# Patient Record
Sex: Female | Born: 1940
Health system: Southern US, Community
[De-identification: ages and names within clinical notes are randomized; demographics above are authoritative.]

## PROBLEM LIST (undated history)

## (undated) DIAGNOSIS — F32A Depression, unspecified: Secondary | ICD-10-CM

## (undated) DIAGNOSIS — R0600 Dyspnea, unspecified: Secondary | ICD-10-CM

## (undated) DIAGNOSIS — H269 Unspecified cataract: Secondary | ICD-10-CM

## (undated) DIAGNOSIS — G43909 Migraine, unspecified, not intractable, without status migrainosus: Secondary | ICD-10-CM

## (undated) DIAGNOSIS — B019 Varicella without complication: Secondary | ICD-10-CM

## (undated) DIAGNOSIS — Z86010 Personal history of colonic polyps: Secondary | ICD-10-CM

## (undated) DIAGNOSIS — J309 Allergic rhinitis, unspecified: Secondary | ICD-10-CM

## (undated) DIAGNOSIS — H409 Unspecified glaucoma: Secondary | ICD-10-CM

## (undated) DIAGNOSIS — K8020 Calculus of gallbladder without cholecystitis without obstruction: Secondary | ICD-10-CM

## (undated) DIAGNOSIS — T7840XA Allergy, unspecified, initial encounter: Secondary | ICD-10-CM

## (undated) DIAGNOSIS — J189 Pneumonia, unspecified organism: Secondary | ICD-10-CM

## (undated) DIAGNOSIS — R32 Unspecified urinary incontinence: Secondary | ICD-10-CM

## (undated) DIAGNOSIS — N200 Calculus of kidney: Secondary | ICD-10-CM

## (undated) DIAGNOSIS — I10 Essential (primary) hypertension: Secondary | ICD-10-CM

## (undated) DIAGNOSIS — E7800 Pure hypercholesterolemia, unspecified: Secondary | ICD-10-CM

## (undated) HISTORY — DX: Unspecified glaucoma: H40.9

## (undated) HISTORY — DX: Unspecified urinary incontinence: R32

## (undated) HISTORY — DX: Calculus of kidney: N20.0

## (undated) HISTORY — DX: Unspecified cataract: H26.9

## (undated) HISTORY — DX: Migraine, unspecified, not intractable, without status migrainosus: G43.909

## (undated) HISTORY — DX: Calculus of gallbladder without cholecystitis without obstruction: K80.20

## (undated) HISTORY — PX: TUBAL LIGATION: SHX77

## (undated) HISTORY — DX: Allergy, unspecified, initial encounter: T78.40XA

## (undated) HISTORY — DX: Essential (primary) hypertension: I10

## (undated) HISTORY — PX: EYE SURGERY: SHX253

## (undated) HISTORY — DX: Pure hypercholesterolemia, unspecified: E78.00

## (undated) HISTORY — DX: Personal history of colonic polyps: Z86.010

## (undated) HISTORY — DX: Allergic rhinitis, unspecified: J30.9

## (undated) HISTORY — DX: Varicella without complication: B01.9

---
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
## 1993-08-26 HISTORY — PX: CHOLECYSTECTOMY: SHX55

## 2014-03-07 ENCOUNTER — Ambulatory Visit (INDEPENDENT_AMBULATORY_CARE_PROVIDER_SITE_OTHER): Payer: Medicare Other

## 2014-03-07 ENCOUNTER — Other Ambulatory Visit: Payer: Self-pay | Admitting: *Deleted

## 2014-03-07 ENCOUNTER — Ambulatory Visit (INDEPENDENT_AMBULATORY_CARE_PROVIDER_SITE_OTHER): Payer: Medicare Other | Admitting: Podiatry

## 2014-03-07 ENCOUNTER — Encounter: Payer: Self-pay | Admitting: Podiatry

## 2014-03-07 VITALS — BP 129/75 | HR 77 | Resp 16 | Ht 64.5 in | Wt 150.0 lb

## 2014-03-07 DIAGNOSIS — M2040 Other hammer toe(s) (acquired), unspecified foot: Secondary | ICD-10-CM

## 2014-03-07 DIAGNOSIS — M21619 Bunion of unspecified foot: Secondary | ICD-10-CM

## 2014-03-07 DIAGNOSIS — M21622 Bunionette of left foot: Secondary | ICD-10-CM

## 2014-03-07 DIAGNOSIS — M778 Other enthesopathies, not elsewhere classified: Secondary | ICD-10-CM

## 2014-03-07 DIAGNOSIS — M2041 Other hammer toe(s) (acquired), right foot: Secondary | ICD-10-CM

## 2014-03-07 DIAGNOSIS — M7750 Other enthesopathy of unspecified foot: Secondary | ICD-10-CM

## 2014-03-07 DIAGNOSIS — M79609 Pain in unspecified limb: Secondary | ICD-10-CM

## 2014-03-07 DIAGNOSIS — M779 Enthesopathy, unspecified: Secondary | ICD-10-CM

## 2014-03-07 DIAGNOSIS — Q828 Other specified congenital malformations of skin: Secondary | ICD-10-CM

## 2014-03-07 NOTE — Progress Notes (Signed)
Spot on the bottom of the left foot 5th plantar met head , the right 5th toe has a corn.  Objective: Vital signs are stable she is alert and oriented x3. She has pain on palpation fifth metatarsophalangeal joint of the left foot with a solitary porokeratotic lesion sub-fifth metatarsal head left. She also has adductovarus rotated hammertoe deformity fifth right with an overlying area of reactive hyperkeratosis.  Assessment: Pain in limb secondary capsulitis and porokeratotic lesion plantar aspect fifth metatarsal head of the left foot. Painful hammertoe deformity fourth fifth digit right foot.  Plan: Discussed etiology pathology conservative versus surgical therapies. Injected the fifth metatarsophalangeal joint today with Kenalog and local anesthetic. Performed excision of the porokeratosis and applied salicylic acid under occlusion to remove the remaining hyperkeratotic lesion. I debrided the corn overlying the fifth digit right foot.

## 2015-10-05 ENCOUNTER — Encounter: Payer: Self-pay | Admitting: Family Medicine

## 2015-10-05 ENCOUNTER — Ambulatory Visit (INDEPENDENT_AMBULATORY_CARE_PROVIDER_SITE_OTHER): Payer: PPO | Admitting: Family Medicine

## 2015-10-05 VITALS — BP 130/72 | HR 92 | Temp 98.4°F | Ht 64.5 in | Wt 159.2 lb

## 2015-10-05 DIAGNOSIS — H6120 Impacted cerumen, unspecified ear: Secondary | ICD-10-CM | POA: Insufficient documentation

## 2015-10-05 DIAGNOSIS — Z23 Encounter for immunization: Secondary | ICD-10-CM | POA: Diagnosis not present

## 2015-10-05 DIAGNOSIS — N8110 Cystocele, unspecified: Secondary | ICD-10-CM

## 2015-10-05 DIAGNOSIS — H6121 Impacted cerumen, right ear: Secondary | ICD-10-CM

## 2015-10-05 DIAGNOSIS — R32 Unspecified urinary incontinence: Secondary | ICD-10-CM

## 2015-10-05 HISTORY — DX: Unspecified urinary incontinence: R32

## 2015-10-05 NOTE — Progress Notes (Signed)
Pre visit review using our clinic review tool, if applicable. No additional management support is needed unless otherwise documented below in the visit note. 

## 2015-10-05 NOTE — Assessment & Plan Note (Signed)
Patient with mild incontinence of urine. Pelvic exam did reveal some degree of bladder prolapse and possible uterine prolapse that could be contributing to this. She has no UTI-like symptoms. We will refer to gynecology for evaluation of prolapse for consideration of pessary if deemed necessary. Given return precautions.

## 2015-10-05 NOTE — Patient Instructions (Signed)
History meet you. We will refer you to gynecology for evaluation of your bladder and uterine prolapse and incontinence. Please monitor your hearing. If you continue to have issues with let us know. If you develop abdominal pain, burning with urination, frequency, urgency, or any new or change in symptoms please seek medical attention.

## 2015-10-05 NOTE — Progress Notes (Signed)
Patient ID: Lauren [name], female   DOB: 02-20-1941, 75 y.o.   MRN: [id number]  Tommi Rumps, MD Phone: [phone]  Lauren [name] is a 75 y.o. female who presents today for new patient visit.  Urine incontinence: Patient notes for the last 6-8 months she leaks a small amount of urine at random times. Does not happen every day. Not associated with coughing or sneezing. Note she does wear a pad. No frequency, urgency, or dysuria. No hematuria. No abdominal pain. Denies back pain with this, no saddle anesthesia or bowel incontinence, no fevers, no history of cancer.  Trouble hearing out of her right ear has been going on for last month. Note she went to a seminar and sat under a fan and notes she had a cold following this. Notes the cold symptoms went away though the sensation in her right ear has remained. States it feels like the ears underwater. No pain or tinnitus. Mild decreased hearing.  Active Ambulatory Problems    Diagnosis Date Noted  . Cerumen impaction 10/05/2015  . Incontinence of urine in female 10/05/2015   Resolved Ambulatory Problems    Diagnosis Date Noted  . No Resolved Ambulatory Problems   Past Medical History  Diagnosis Date  . Chickenpox   . Allergic rhinitis   . Migraines   . Urinary incontinence     Family History  Problem Relation Age of Onset  . Arthritis    . Heart disease    . Hypertension      Social History   Social History  . Marital Status: Unknown    Spouse Name: N/A  . Number of Children: N/A  . Years of Education: N/A   Occupational History  . Not on file.   Social History Main Topics  . Smoking status: Former Research scientist (life sciences)  . Smokeless tobacco: Never Used  . Alcohol Use: No  . Drug Use: No  . Sexual Activity: Not on file   Other Topics Concern  . Not on file   Social History Narrative    ROS   General:  Negative for nexplained weight loss, fever Skin: Negative for new or changing mole, sore that won't heal HEENT: Positive  for trouble hearing, Negative for trouble seeing, ringing in ears, mouth sores, hoarseness, change in voice, dysphagia. CV:  Negative for chest pain, dyspnea, edema, palpitations Resp: Negative for cough, dyspnea, hemoptysis GI: Negative for nausea, vomiting, diarrhea, constipation, abdominal pain, melena, hematochezia. GU: Positive for incontinence, Negative for dysuria, urinary hesitance, hematuria, vaginal or penile discharge, polyuria, sexual difficulty, lumps in testicle or breasts MSK: Negative for muscle cramps or aches, joint pain or swelling Neuro: Negative for headaches, weakness, numbness, dizziness, passing out/fainting Psych: Negative for depression, anxiety, memory problems  Objective  Physical Exam Filed Vitals:   10/05/15 1418  BP: 130/72  Pulse: 92  Temp: 98.4 F (36.9 C)    BP Readings from Last 3 Encounters:  10/05/15 130/72  03/07/14 129/75   Wt Readings from Last 3 Encounters:  10/05/15 159 lb 3.2 oz (72.213 kg)  03/07/14 150 lb (68.04 kg)    Physical Exam  Constitutional: She is well-developed, well-nourished, and in no distress.  HENT:  Head: Normocephalic and atraumatic.  Right Ear: External ear normal.  Left Ear: External ear normal.  Mouth/Throat: Oropharynx is clear and moist.  Left TM normal, right TM initially obscured by cerumen, though after irrigation appears normal with mild amount of cerumen remaining, patient's hearing much improved after irrigation of right ear  canal  Eyes: Conjunctivae are normal. Pupils are equal, round, and reactive to light.  Neck: Neck supple.  Cardiovascular: Normal rate, regular rhythm and normal heart sounds.  Exam reveals no gallop and no friction rub.   No murmur heard. Pulmonary/Chest: Effort normal and breath sounds normal. No respiratory distress. She has no wheezes. She has no rales.  Abdominal: Soft. Bowel sounds are normal. She exhibits no distension. There is no tenderness. There is no rebound and no  guarding.  Genitourinary:  Normal labia, normal vaginal mucosa, cervix appears normal though is relatively close to the vaginal opening, on bimanual exam cervix felt to move towards vaginal opening with coughing, anterior vaginal wall felt to move down on coughing  Musculoskeletal: She exhibits no edema.  Lymphadenopathy:    She has no cervical adenopathy.  Neurological: She is alert. Gait normal.  Skin: Skin is warm and dry. She is not diaphoretic.  Psychiatric: Mood and affect normal.     Assessment/Plan:   Cerumen impaction This is likely the cause of the right ear hearing trouble. Much improved after irrigation by CMA. Patient will continue to monitor.  Incontinence of urine in female Patient with mild incontinence of urine. Pelvic exam did reveal some degree of bladder prolapse and possible uterine prolapse that could be contributing to this. She has no UTI-like symptoms. We will refer to gynecology for evaluation of prolapse for consideration of pessary if deemed necessary. Given return precautions.    Orders Placed This Encounter  Procedures  . Pneumococcal polysaccharide vaccine 23-valent greater than or equal to 2yo subcutaneous/IM  . Ambulatory referral to Gynecology    Referral Priority:  Routine    Referral Type:  Consultation    Referral Reason:  Specialty Services Required    Requested Specialty:  Gynecology    Number of Visits Requested:  1    Tommi Rumps

## 2015-10-05 NOTE — Assessment & Plan Note (Signed)
This is likely the cause of the right ear hearing trouble. Much improved after irrigation by CMA. Patient will continue to monitor.

## 2015-10-11 ENCOUNTER — Other Ambulatory Visit: Payer: Self-pay | Admitting: Family Medicine

## 2015-10-11 DIAGNOSIS — N8110 Cystocele, unspecified: Secondary | ICD-10-CM

## 2015-10-18 DIAGNOSIS — Z87891 Personal history of nicotine dependence: Secondary | ICD-10-CM | POA: Diagnosis not present

## 2015-10-18 DIAGNOSIS — Z6826 Body mass index (BMI) 26.0-26.9, adult: Secondary | ICD-10-CM | POA: Diagnosis not present

## 2015-10-18 DIAGNOSIS — Z0000 Encounter for general adult medical examination without abnormal findings: Secondary | ICD-10-CM | POA: Diagnosis not present

## 2015-10-18 DIAGNOSIS — R32 Unspecified urinary incontinence: Secondary | ICD-10-CM | POA: Diagnosis not present

## 2015-10-18 DIAGNOSIS — E663 Overweight: Secondary | ICD-10-CM | POA: Diagnosis not present

## 2015-11-03 ENCOUNTER — Ambulatory Visit (INDEPENDENT_AMBULATORY_CARE_PROVIDER_SITE_OTHER): Payer: PPO | Admitting: Urology

## 2015-11-03 ENCOUNTER — Encounter: Payer: Self-pay | Admitting: Urology

## 2015-11-03 VITALS — BP 151/73 | HR 76 | Ht 63.5 in | Wt 158.6 lb

## 2015-11-03 DIAGNOSIS — N361 Urethral diverticulum: Secondary | ICD-10-CM | POA: Diagnosis not present

## 2015-11-03 DIAGNOSIS — R32 Unspecified urinary incontinence: Secondary | ICD-10-CM

## 2015-11-03 LAB — URINALYSIS, COMPLETE
BILIRUBIN UA: NEGATIVE
GLUCOSE, UA: NEGATIVE
Ketones, UA: NEGATIVE
Leukocytes, UA: NEGATIVE
NITRITE UA: NEGATIVE
PH UA: 6 (ref 5.0–7.5)
PROTEIN UA: NEGATIVE
RBC UA: NEGATIVE
SPEC GRAV UA: 1.015 (ref 1.005–1.030)
Urobilinogen, Ur: 0.2 mg/dL (ref 0.2–1.0)

## 2015-11-03 LAB — MICROSCOPIC EXAMINATION
Bacteria, UA: NONE SEEN
WBC, UA: NONE SEEN /hpf (ref 0–?)

## 2015-11-03 LAB — BLADDER SCAN AMB NON-IMAGING

## 2015-11-03 NOTE — Progress Notes (Signed)
Bladder Scan °Patient void: 36 ml °Performed By: K Eldredge Veldhuizen, CMA °

## 2015-11-03 NOTE — Progress Notes (Signed)
11/03/2015 10:20 AM   Shauna Hugh Quentin Mulling February 12, 1941 [id number]  Referring provider: Guadalupe Maple, MD [address] [place], [place] [address]  Chief Complaint  Patient presents with  . Bladder Prolapse    urinary incontinence    HPI: The patient is a 75 year old woman who has worsening incontinence over 1 year. She says she does not leak with stress maneuvers or with urgency. She leaks a small amount not associated with awareness during activity levels. She has a little bit of dampness some nights. She wears 3 pads per day moderately wet.  She denies a history of previous GU surgery urinary tract infections and has no neurologic risk factor symptoms. She had a kidney stone years ago. Her bowel movements are normal. The presentation is not been medically managed  Modifying factors: There are no other modifying factors  Associated signs and symptoms: There are no other associated signs and symptoms Aggravating and relieving factors: There are no other aggravating or relieving factors Severity: Mild Duration: Persistent     PMH: Past Medical History  Diagnosis Date  . Chickenpox   . Allergic rhinitis   . Migraines   . Urinary incontinence     Surgical History: Past Surgical History  Procedure Laterality Date  . Cholecystectomy  1995  . Tonsillectomy  1962    Home Medications:    Medication List    Notice  As of 11/03/2015 10:20 AM   You have not been prescribed any medications.      Allergies:  Allergies  Allergen Reactions  . Eggs Or Egg-Derived Products   . Hydrocodone-Guaifenesin Other (See Comments)    Cant sleep   . Mobic [Meloxicam] Swelling  . Sulfa Antibiotics Other (See Comments)    Cold sweats, passing out   . Codeine Rash    Cold sweats     Family History: Family History  Problem Relation Age of Onset  . Arthritis    . Heart disease    . Hypertension    . Hematuria Father     Social History:  reports that she has quit smoking. She has  never used smokeless tobacco. She reports that she does not drink alcohol or use illicit drugs.  ROS: UROLOGY Frequent Urination?: No Hard to postpone urination?: No Burning/pain with urination?: No Get up at night to urinate?: Yes Leakage of urine?: Yes Urine stream starts and stops?: No Trouble starting stream?: No Do you have to strain to urinate?: No Blood in urine?: No Urinary tract infection?: No Sexually transmitted disease?: No Injury to kidneys or bladder?: No Painful intercourse?: No Weak stream?: No Currently pregnant?: No Vaginal bleeding?: No Last menstrual period?: 1968  Gastrointestinal Nausea?: No Vomiting?: No Indigestion/heartburn?: No Diarrhea?: No Constipation?: No  Constitutional Fever: No Night sweats?: No Weight loss?: No Fatigue?: No  Skin Skin rash/lesions?: No Itching?: No  Eyes Blurred vision?: No Double vision?: No  Ears/Nose/Throat Sore throat?: No Sinus problems?: Yes  Hematologic/Lymphatic Swollen glands?: No Easy bruising?: Yes  Cardiovascular Leg swelling?: No Chest pain?: No  Respiratory Cough?: No Shortness of breath?: No  Endocrine Excessive thirst?: No  Musculoskeletal Back pain?: No Joint pain?: No  Neurological Headaches?: No Dizziness?: No  Psychologic Depression?: No Anxiety?: No  Physical Exam: BP 151/73 mmHg  Pulse 76  Ht 5' 3.5" (1.613 m)  Wt 158 lb 9.6 oz (71.94 kg)  BMI 27.65 kg/m2  Constitutional:  Alert and oriented, No acute distress. HEENT: [place] AT, moist mucus membranes.  Trachea midline, no  masses. Cardiovascular: No clubbing, cyanosis, or edema. Respiratory: Normal respiratory effort, no increased work of breathing. GI: Abdomen is soft, nontender, nondistended, no abdominal masses GU: No CVA tenderness. At rest the patient had small grade 2 cystocele with mild central defect that did not distend further with a cough. She also has some descensus of her urethra but did not distend  further with a cough. She had a double suburethral swelling almost purple discoloration. She had no rectocele. Skin: No rashes, bruises or suspicious lesions. Lymph: No cervical or inguinal adenopathy. Neurologic: Grossly intact, no focal deficits, moving all 4 extremities. Psychiatric: Normal mood and affect.  Laboratory Data:  Urinalysis No results found for: COLORURINE, APPEARANCEUR, LABSPEC, PHURINE, GLUCOSEU, HGBUR, BILIRUBINUR, KETONESUR, PROTEINUR, UROBILINOGEN, NITRITE, LEUKOCYTESUR  Pertinent Imaging: None  Assessment & Plan:  The patient primarily has leakage not associated awareness and mild enuresis. My index of suspicion is mild that she has urethral diverticulum. Because she leaks not associated with awareness that should be ruled out. Because of her enuresis is reasonable to try overactive bladder dictations first. Having said that urodynamics would be quite useful to order.   Return with MRI with and without contrast. If no diverticulum try behavioral therapy and medication. If fail order urodynamics  1. Incontinence of urine in female 2. Possible urethral diverticulum - Urinalysis, Complete - Bladder Scan (Post Void Residual) in office    Reece Packer, MD  [facility] [address] [place], [place] [address] [phone]

## 2015-11-08 ENCOUNTER — Ambulatory Visit (INDEPENDENT_AMBULATORY_CARE_PROVIDER_SITE_OTHER): Payer: PPO

## 2015-11-08 VITALS — BP 132/72 | HR 83 | Temp 97.4°F | Resp 14 | Ht 63.5 in | Wt 156.1 lb

## 2015-11-08 DIAGNOSIS — Z1239 Encounter for other screening for malignant neoplasm of breast: Secondary | ICD-10-CM

## 2015-11-08 DIAGNOSIS — E2839 Other primary ovarian failure: Secondary | ICD-10-CM | POA: Diagnosis not present

## 2015-11-08 DIAGNOSIS — Z0000 Encounter for general adult medical examination without abnormal findings: Secondary | ICD-10-CM

## 2015-11-08 NOTE — Patient Instructions (Addendum)
Lauren [name] , Thank you for taking time to come for your Medicare Wellness Visit. I appreciate your ongoing commitment to your health goals. Please review the following plan we discussed and let me know if I can assist you in the future.   Return in 2 weeks for follow up with Dr. Caryl Bis.   This is a list of the screening recommended for you and due dates:  Health Maintenance  Topic Date Due  . Mammogram  12/07/1992  . Colon Cancer Screening  12/07/1992  . Shingles Vaccine  12/08/2002  . DEXA scan (bone density measurement)  12/08/2007  . Flu Shot  11/24/2015*  . Pneumonia vaccines (2 of 2 - PCV13) 10/04/2016  . Tetanus Vaccine  08/31/2019  *Topic was postponed. The date shown is not the original due date.    Bone Densitometry Bone densitometry is an imaging test that uses a special X-ray to measure the amount of calcium and other minerals in your bones (bone density). This test is also known as a bone mineral density test or dual-energy X-ray absorptiometry (DXA). The test can measure bone density at your hip and your spine. It is similar to having a regular X-ray. You may have this test to:  Diagnose a condition that causes weak or thin bones (osteoporosis).  Predict your risk of a broken bone (fracture).  Determine how well osteoporosis treatment is working. LET [facility] CARE PROVIDER KNOW ABOUT:  Any allergies you have.  All medicines you are taking, including vitamins, herbs, eye drops, creams, and over-the-counter medicines.  Previous problems you or members of your family have had with the use of anesthetics.  Any blood disorders you have.  Previous surgeries you have had.  Medical conditions you have.  Possibility of pregnancy.  Any other medical test you had within the previous 14 days that used contrast material. RISKS AND COMPLICATIONS Generally, this is a safe procedure. However, problems can occur and may include the following:  This test exposes you  to a very small amount of radiation.  The risks of radiation exposure may be greater to unborn children. BEFORE THE PROCEDURE  Do not take any calcium supplements for 24 hours before having the test. You can otherwise eat and drink what you usually do.  Take off all metal jewelry, eyeglasses, dental appliances, and any other metal objects. PROCEDURE  You may lie on an exam table. There will be an X-ray generator below you and an imaging device above you.  Other devices, such as boxes or braces, may be used to position your body properly for the scan.  You will need to lie still while the machine slowly scans your body.  The images will show up on a computer monitor. AFTER THE PROCEDURE You may need more testing at a later time.   This information is not intended to replace advice given to you by your health care provider. Make sure you discuss any questions you have with your health care provider.   Document Released: 09/03/2004 Document Revised: 09/02/2014 Document Reviewed: 01/20/2014 Elsevier Interactive Patient Education 2016 [employer].  Colonoscopy A colonoscopy is an exam to look at the entire large intestine (colon). This exam can help find problems such as tumors, polyps, inflammation, and areas of bleeding. The exam takes about 1 hour.  LET [facility] CARE PROVIDER KNOW ABOUT:   Any allergies you have.  All medicines you are taking, including vitamins, herbs, eye drops, creams, and over-the-counter medicines.  Previous problems you or  members of your family have had with the use of anesthetics.  Any blood disorders you have.  Previous surgeries you have had.  Medical conditions you have. RISKS AND COMPLICATIONS  Generally, this is a safe procedure. However, as with any procedure, complications can occur. Possible complications include:  Bleeding.  Tearing or rupture of the colon wall.  Reaction to medicines given during the exam.  Infection  (rare). BEFORE THE PROCEDURE   Ask your health care provider about changing or stopping your regular medicines.  You may be prescribed an oral bowel prep. This involves drinking a large amount of medicated liquid, starting the day before your procedure. The liquid will cause you to have multiple loose stools until your stool is almost clear or light green. This cleans out your colon in preparation for the procedure.  Do not eat or drink anything else once you have started the bowel prep, unless your health care provider tells you it is safe to do so.  Arrange for someone to drive you home after the procedure. PROCEDURE   You will be given medicine to help you relax (sedative).  You will lie on your side with your knees bent.  A long, flexible tube with a light and camera on the end (colonoscope) will be inserted through the rectum and into the colon. The camera sends video back to a computer screen as it moves through the colon. The colonoscope also releases carbon dioxide gas to inflate the colon. This helps your health care provider see the area better.  During the exam, your health care provider may take a small tissue sample (biopsy) to be examined under a microscope if any abnormalities are found.  The exam is finished when the entire colon has been viewed. AFTER THE PROCEDURE   Do not drive for 24 hours after the exam.  You may have a small amount of blood in your stool.  You may pass moderate amounts of gas and have mild abdominal cramping or bloating. This is caused by the gas used to inflate your colon during the exam.  Ask when your test results will be ready and how you will get your results. Make sure you get your test results.   This information is not intended to replace advice given to you by your health care provider. Make sure you discuss any questions you have with your health care provider.   Document Released: 08/09/2000 Document Revised: 06/02/2013 Document  Reviewed: 04/19/2013 Elsevier Interactive Patient Education 2016 [place] A mammogram is an X-ray of the breasts that is done to check for abnormal changes. This procedure can screen for and detect any changes that may suggest breast cancer. A mammogram can also identify other changes and variations in the breast, such as:  Inflammation of the breast tissue (mastitis).  An infected area that contains a collection of pus (abscess).  A fluid-filled sac (cyst).  Fibrocystic changes. This is when breast tissue becomes denser, which can make the tissue feel rope-like or uneven under the skin.  Tumors that are not cancerous (benign). LET [facility] CARE PROVIDER KNOW ABOUT:  Any allergies you have.  If you have breast implants.  If you have had previous breast disease, biopsy, or surgery.  If you are breastfeeding.  Any possibility that you could be pregnant, if this applies.  If you are younger than age 12.  If you have a family history of breast cancer. RISKS AND COMPLICATIONS Generally, this is a safe procedure.  However, problems may occur, including:  Exposure to radiation. Radiation levels are very low with this test.  The results being misinterpreted.  The need for further tests.  The inability of the mammogram to detect certain cancers. BEFORE THE PROCEDURE  Schedule your test about 1-2 weeks after your menstrual period. This is usually when your breasts are the least tender.  If you have had a mammogram done at a different facility in the past, get the mammogram X-rays or have them sent to your current exam facility in order to compare them.  Wash your breasts and under your arms the day of the test.  Do not wear deodorants, perfumes, lotions, or powders anywhere on your body on the day of the test.  Remove any jewelry from your neck.  Wear clothes that you can change into and out of easily. PROCEDURE  You will undress from the waist up and  put on a gown.  You will stand in front of the X-ray machine.  Each breast will be placed between two plastic or glass plates. The plates will compress your breast for a few seconds. Try to stay as relaxed as possible during the procedure. This does not cause any harm to your breasts and any discomfort you feel will be very brief.  X-rays will be taken from different angles of each breast. The procedure may vary among health care providers and hospitals. AFTER THE PROCEDURE  The mammogram will be examined by a specialist (radiologist).  You may need to repeat certain parts of the test, depending on the quality of the images. This is commonly done if the radiologist needs a better view of the breast tissue.  Ask when your test results will be ready. Make sure you get your test results.  You may resume your normal activities.   This information is not intended to replace advice given to you by your health care provider. Make sure you discuss any questions you have with your health care provider.   Document Released: 08/09/2000 Document Revised: 05/03/2015 Document Reviewed: 10/21/2014 Elsevier Interactive Patient Education [employer].

## 2015-11-08 NOTE — Progress Notes (Signed)
Subjective:   Lauren [name] is a 75 y.o. female who presents for an Initial Medicare Annual Wellness Visit.  Review of Systems    No ROS.  Medicare Wellness Visit.  Cardiac Risk Factors include: advanced age (>48men, >44 women)     Objective:    Today's Vitals   11/08/15 1057  BP: 132/72  Pulse: 83  Temp: 97.4 F (36.3 C)  TempSrc: Oral  Resp: 14  Height: 5' 3.5" (1.613 m)  Weight: 156 lb 1.9 oz (70.816 kg)  SpO2: 98%    Current Medications (verified) No outpatient encounter prescriptions on file as of 11/08/2015.   No facility-administered encounter medications on file as of 11/08/2015.    Allergies (verified) Eggs or egg-derived products; Hydrocodone-guaifenesin; Mobic; Sulfa antibiotics; and Codeine   History: Past Medical History  Diagnosis Date  . Chickenpox   . Allergic rhinitis   . Migraines   . Urinary incontinence    Past Surgical History  Procedure Laterality Date  . Cholecystectomy  1995  . Tonsillectomy  1962   Family History  Problem Relation Age of Onset  . Arthritis    . Heart disease    . Hypertension    . Hematuria Father    Social History   Occupational History  . Not on file.   Social History Main Topics  . Smoking status: Former Research scientist (life sciences)  . Smokeless tobacco: Never Used  . Alcohol Use: No  . Drug Use: No  . Sexual Activity: No    Tobacco Counseling Counseling given: Not Answered   Activities of Daily Living In your present state of health, do you have any difficulty performing the following activities: 11/08/2015  Hearing? N  Vision? N  Difficulty concentrating or making decisions? N  Walking or climbing stairs? N  Dressing or bathing? N  Doing errands, shopping? N  Preparing Food and eating ? N  Using the Toilet? N  In the past six months, have you accidently leaked urine? Y  Do you have problems with loss of bowel control? N  Managing your Medications? N  Managing your Finances? N  Housekeeping or managing your  Housekeeping? N    Immunizations and Health Maintenance Immunization History  Administered Date(s) Administered  . Pneumococcal Polysaccharide-23 10/05/2015   Health Maintenance Due  Topic Date Due  . MAMMOGRAM  12/07/1992  . COLONOSCOPY  12/07/1992  . ZOSTAVAX  12/08/2002  . DEXA SCAN  12/08/2007    Patient Care Team: Leone Haven, MD as PCP - General (Family Medicine)  Indicate any recent Medical Services you may have received from other than [facility] providers in the past year (date may be approximate).     Assessment:   This is a routine wellness examination for Lauren [name]. The goal of the wellness visit is to assist the patient how to close the gaps in care and create a preventative care plan for the patient.   Osteoporosis risk reviewed.  Medications reviewed; none on file.   Safety issues reviewed; smoke detectors in the home. No firearms in the home.  Wears seatbelts when driving or riding with others. No violence in the home.  No identified risk were noted; The patient was oriented x 3; appropriate in dress and manner and no objective failures at ADL's or IADL's.   Influenza vaccine postponed for follow up PCP.  Severe allergy to eggs.  ZOSTAVAX vaccine declined.  Hx of shingles x3.  Mammogram, Bone Density and COLOGUARD order completed.  Educational material provided.  Patient Concerns:  None at this time.  Follow up with PCP as needed.  Hearing/Vision screen Hearing Screening Comments: Passes the whisper test Vision Screening Comments: Followed by Dr. Gloriann Loan Wears glasses Last OV 2013  Upcoming visit scheduled   Dietary issues and exercise activities discussed: Current Exercise Habits: Home exercise routine, Type of exercise: walking, Time (Minutes): 30, Frequency (Times/Week): 4, Weekly Exercise (Minutes/Week): 120, Intensity: Moderate  Goals    . Healthy Lifestyle     Maintain exercise regiment of walking Stay hydrated!  Drink plenty of water. Low  carb foods.  Lean meats, fruits and vegetables.      Depression Screen PHQ 2/9 Scores 11/08/2015  PHQ - 2 Score 0    Fall Risk Fall Risk  11/08/2015  Falls in the past year? No    Cognitive Function: MMSE - Mini Mental State Exam 11/08/2015  Orientation to time 5  Orientation to Place 5  Registration 3  Attention/ Calculation 5  Recall 3  Language- name 2 objects 2  Language- repeat 1  Language- follow 3 step command 3  Language- read & follow direction 1  Write a sentence 1  Copy design 1  Total score 30    Screening Tests Health Maintenance  Topic Date Due  . MAMMOGRAM  12/07/1992  . COLONOSCOPY  12/07/1992  . ZOSTAVAX  12/08/2002  . DEXA SCAN  12/08/2007  . INFLUENZA VACCINE  11/24/2015 (Originally 03/27/2015)  . PNA vac Low Risk Adult (2 of 2 - PCV13) 10/04/2016  . TETANUS/TDAP  08/31/2019      Plan:   End of life planning; Advance aging; Advanced directives discussed. Copy of current HCPOA/Living Will requested.   During the course of the visit, [place] was educated and counseled about the following appropriate screening and preventive services:   Vaccines to include Pneumoccal, Influenza, Hepatitis B, Td, Zostavax, HCV  Electrocardiogram  Cardiovascular disease screening  Colorectal cancer screening  Bone density screening  Diabetes screening  Glaucoma screening  Mammography/PAP  Nutrition counseling  Smoking cessation counseling  Patient Instructions (the written plan) were given to the patient.    Varney Biles, LPN   [id number]

## 2015-11-08 NOTE — Progress Notes (Signed)
Patient ID: Lauren [name], female   DOB: [birth year], 75 y.o.   MRN: [id number] I have reviewed and agree with the above note.

## 2015-11-13 ENCOUNTER — Other Ambulatory Visit: Payer: Self-pay | Admitting: Surgical

## 2015-11-13 DIAGNOSIS — E2839 Other primary ovarian failure: Secondary | ICD-10-CM

## 2015-11-20 ENCOUNTER — Ambulatory Visit (INDEPENDENT_AMBULATORY_CARE_PROVIDER_SITE_OTHER): Payer: PPO | Admitting: Family Medicine

## 2015-11-20 ENCOUNTER — Ambulatory Visit: Payer: PPO

## 2015-11-20 ENCOUNTER — Encounter: Payer: Self-pay | Admitting: Family Medicine

## 2015-11-20 VITALS — BP 134/62 | HR 82 | Temp 98.1°F | Ht 63.5 in | Wt 158.0 lb

## 2015-11-20 DIAGNOSIS — H6121 Impacted cerumen, right ear: Secondary | ICD-10-CM | POA: Diagnosis not present

## 2015-11-20 DIAGNOSIS — E7800 Pure hypercholesterolemia, unspecified: Secondary | ICD-10-CM

## 2015-11-20 DIAGNOSIS — R32 Unspecified urinary incontinence: Secondary | ICD-10-CM | POA: Diagnosis not present

## 2015-11-20 NOTE — Progress Notes (Signed)
Pre visit review using our clinic review tool, if applicable. No additional management support is needed unless otherwise documented below in the visit note. 

## 2015-11-20 NOTE — Patient Instructions (Signed)
Nice to see you. Please continue to work on diet and exercise for your cholesterol. Please keep the appointment for your MRI. If you develop chest pain, shortness of breath, abdominal pain, or any new or changing symptoms please seek medical attention.

## 2015-11-24 ENCOUNTER — Ambulatory Visit
Admission: RE | Admit: 2015-11-24 | Discharge: 2015-11-24 | Disposition: A | Discharge status: Home / Self Care | Payer: PPO | Source: Ambulatory Visit | Attending: Urology | Admitting: Urology

## 2015-11-24 DIAGNOSIS — D259 Leiomyoma of uterus, unspecified: Secondary | ICD-10-CM | POA: Diagnosis not present

## 2015-11-24 DIAGNOSIS — N361 Urethral diverticulum: Secondary | ICD-10-CM | POA: Diagnosis not present

## 2015-11-24 LAB — POCT I-STAT CREATININE: CREATININE: 1 mg/dL (ref 0.44–1.00)

## 2015-11-24 MED ORDER — GADOBENATE DIMEGLUMINE 529 MG/ML IV SOLN
15.0000 mL | Freq: Once | INTRAVENOUS | Status: AC | PRN
  Administered 2015-11-24: 14 mL via INTRAVENOUS

## 2015-11-26 DIAGNOSIS — E7800 Pure hypercholesterolemia, unspecified: Secondary | ICD-10-CM

## 2015-11-26 HISTORY — DX: Pure hypercholesterolemia, unspecified: E78.00

## 2015-11-26 NOTE — Assessment & Plan Note (Signed)
Mild intermittent incontinence of urine. Suspect related to bladder prolapse. She is following with urology for this and is scheduled to have an MRI of her pelvis to evaluate this further. Will continue to follow with urology.

## 2015-11-26 NOTE — Assessment & Plan Note (Signed)
LDL found to be 137. Patient wants to manage with diet and exercise. She'll continue to work on diet and exercise.

## 2015-11-26 NOTE — Progress Notes (Signed)
Patient ID: Lauren [name], female   DOB: 1941/03/16, 75 y.o.   MRN: [id number]  Tommi Rumps, MD Phone: [phone]  Lauren [name] is a 75 y.o. female who presents today for follow-up.  HYPERLIPIDEMIA Symptoms Chest pain on exertion:  No   Leg claudication:   No Medications: Declines medications. She has been watching what she eats. She's lost 30 pounds. She's continuing to exercise.  Urinary incontinence: Notes she saw urology for this. She notes she still has mild incontinence at random times that is just aggravating. They are arranging an MRI of her pelvis for later this week. No dysuria or frequency. Was noted to have a prolapsed bladder by the urologist.  She reports no issues hearing out of her right ear since her last visit. Hearing issues resolved after irrigation and removal of cerumen.  PMH: Former smoker   ROS see history of present illness  Objective  Physical Exam Filed Vitals:   11/20/15 1345  BP: 134/62  Pulse: 82  Temp: 98.1 F (36.7 C)    BP Readings from Last 3 Encounters:  11/20/15 134/62  11/08/15 132/72  11/03/15 151/73   Wt Readings from Last 3 Encounters:  11/20/15 158 lb (71.668 kg)  11/08/15 156 lb 1.9 oz (70.816 kg)  11/03/15 158 lb 9.6 oz (71.94 kg)    Physical Exam  Constitutional: She is well-developed, well-nourished, and in no distress.  HENT:  Head: Normocephalic and atraumatic.  Right Ear: External ear normal.  Left Ear: External ear normal.  Mouth/Throat: Oropharynx is clear and moist. No oropharyngeal exudate.  Normal TMs bilaterally  Eyes: Conjunctivae are normal. Pupils are equal, round, and reactive to light.  Cardiovascular: Normal rate, regular rhythm and normal heart sounds.   Pulmonary/Chest: Effort normal and breath sounds normal.  Abdominal: Soft. Bowel sounds are normal. She exhibits no distension. There is no tenderness. There is no rebound and no guarding.  Neurological: She is alert. Gait normal.  Skin: Skin is  warm and dry. She is not diaphoretic.     Assessment/Plan: Please see individual problem list.  Incontinence of urine in female Mild intermittent incontinence of urine. Suspect related to bladder prolapse. She is following with urology for this and is scheduled to have an MRI of her pelvis to evaluate this further. Will continue to follow with urology.  Cerumen impaction Hearing improved. No evidence of cerumen impaction today.  Elevated LDL cholesterol level LDL found to be 137. Patient wants to manage with diet and exercise. She'll continue to work on diet and exercise.   Tommi Rumps, MD [place]

## 2015-11-26 NOTE — Assessment & Plan Note (Signed)
Hearing improved. No evidence of cerumen impaction today.

## 2015-11-29 ENCOUNTER — Encounter: Payer: Self-pay | Admitting: Urology

## 2015-11-29 ENCOUNTER — Ambulatory Visit (INDEPENDENT_AMBULATORY_CARE_PROVIDER_SITE_OTHER): Payer: PPO | Admitting: Urology

## 2015-11-29 VITALS — BP 126/73 | HR 81 | Ht 63.0 in | Wt 158.0 lb

## 2015-11-29 DIAGNOSIS — R32 Unspecified urinary incontinence: Secondary | ICD-10-CM

## 2015-11-29 MED ORDER — SOLIFENACIN SUCCINATE 5 MG PO TABS: 5.0000 mg | ORAL_TABLET | Freq: Every day | ORAL | Status: DC

## 2015-11-29 NOTE — Progress Notes (Signed)
11/29/2015 2:13 PM   Lauren [name] 31-Oct-1940 [id number]  Referring provider: Guadalupe Maple, MD [address] [place], [place] [address]  Chief Complaint  Patient presents with  . Follow-up    Incontinence of urine in female    HPI: Expand All Collapse All     11/03/2015 10:20 AM   Lauren [name] [birth year] [id number]  Referring provider: Guadalupe Maple, MD [address] [place], [place] [address]  Chief Complaint  Patient presents with  . Bladder Prolapse    urinary incontinence    HPI: The patient is a 75 year old woman who has worsening incontinence over 1 year. She says she does not leak with stress maneuvers or with urgency. She leaks a small amount not associated with awareness during activity levels. She has a little bit of dampness some nights. She wears 3 pads per day moderately wet.  On the patient's last visit she had a small grade 2 cystocele and a double suburethral swelling noted. I felt that she had leakage is not associated awareness and mild enuresis and my index of suspicion is low that she had a diverticulum. I did order an MRI and this was normal I was going to start medication empirically and only or urodynamics if she does not reach her treatment goal  There was no evidence of urethral diverticulum on MRI                PMH: Past Medical History  Diagnosis Date  . Chickenpox   . Allergic rhinitis   . Migraines   . Urinary incontinence   . Incontinence of urine in female 10/05/2015  . Elevated LDL cholesterol level 11/26/2015    Surgical History: Past Surgical History  Procedure Laterality Date  . Cholecystectomy  1995  . Tonsillectomy  1962    Home Medications:    Medication List    Notice  As of 11/29/2015  2:13 PM   You have not been prescribed any medications.      Allergies:  Allergies  Allergen Reactions  . Eggs Or Egg-Derived Products   . Hydrocodone-Guaifenesin Other (See Comments)    Cant sleep   . Mobic  [Meloxicam] Swelling  . Sulfa Antibiotics Other (See Comments)    Cold sweats, passing out   . Codeine Rash    Cold sweats     Family History: Family History  Problem Relation Age of Onset  . Arthritis    . Heart disease    . Hypertension    . Hematuria Father     Social History:  reports that she has quit smoking. She has never used smokeless tobacco. She reports that she does not drink alcohol or use illicit drugs.   Laboratory Data: No results found for: WBC, HGB, HCT, MCV, PLT  Lab Results  Component Value Date   CREATININE 1.00 11/24/2015    No results found for: PSA  No results found for: TESTOSTERONE  No results found for: HGBA1C  Urinalysis    Component Value Date/Time   APPEARANCEUR Clear 11/03/2015 1048   GLUCOSEU Negative 11/03/2015 1048   BILIRUBINUR Negative 11/03/2015 1048   PROTEINUR Negative 11/03/2015 1048   NITRITE Negative 11/03/2015 1048   LEUKOCYTESUR Negative 11/03/2015 1048    Pertinent Imaging: As above  Assessment & Plan:  The patient for ongoing frequency and an urinary incontinence not associate awareness and mild enuresis was started on medical therapy. Vesicare 5 mg samples and prescription given. Reassess in a month  There are no diagnoses linked to this encounter.  No Follow-up on file.  Reece Packer, MD  [facility] [address] [place], [place] [address] [phone]

## 2015-12-08 ENCOUNTER — Ambulatory Visit
Admission: RE | Admit: 2015-12-08 | Discharge: 2015-12-08 | Disposition: A | Discharge status: Home / Self Care | Payer: PPO | Source: Ambulatory Visit | Attending: Family Medicine | Admitting: Family Medicine

## 2015-12-08 DIAGNOSIS — Z780 Asymptomatic menopausal state: Secondary | ICD-10-CM | POA: Diagnosis not present

## 2015-12-08 DIAGNOSIS — Z1231 Encounter for screening mammogram for malignant neoplasm of breast: Secondary | ICD-10-CM | POA: Diagnosis not present

## 2015-12-08 DIAGNOSIS — Z1239 Encounter for other screening for malignant neoplasm of breast: Secondary | ICD-10-CM

## 2015-12-08 DIAGNOSIS — M810 Age-related osteoporosis without current pathological fracture: Secondary | ICD-10-CM | POA: Diagnosis not present

## 2015-12-08 DIAGNOSIS — E2839 Other primary ovarian failure: Secondary | ICD-10-CM

## 2015-12-11 ENCOUNTER — Other Ambulatory Visit: Payer: Self-pay | Admitting: Family Medicine

## 2015-12-11 MED ORDER — ALENDRONATE SODIUM 70 MG PO TABS
70.0000 mg | ORAL_TABLET | ORAL | Status: DC
Start: 2015-12-11 — End: 2016-05-24

## 2015-12-21 ENCOUNTER — Telehealth: Payer: Self-pay

## 2015-12-21 NOTE — Telephone Encounter (Signed)
She may have samples of Toviaz.

## 2015-12-21 NOTE — Telephone Encounter (Signed)
Pt called stating she saw Dr. Matilde Sprang in earlier in the month and he gave her samples of vesicare for urinary incontinence. Pt states that the medication is not working. Pt stated that she was advised to try vesicare and toviaz prior to [employer] for insurance purposes. Pt requested to try toviaz. Please advise.

## 2015-12-22 ENCOUNTER — Telehealth: Payer: Self-pay

## 2015-12-22 NOTE — Telephone Encounter (Signed)
Pt came in to get tovaiz samples and nurse found out there were no samples. Therefore vesicare 10mg  was given until f/u appt with Dr. Matilde Sprang.

## 2015-12-22 NOTE — Telephone Encounter (Signed)
Spoke with pt in reference medication. Made aware toviaz samples will be left up front. Pt voiced understanding.

## 2015-12-22 NOTE — Telephone Encounter (Signed)
Toviaz rep stopped by.  Therefore 2 weeks of toviaz 4mg  were given.

## 2016-01-19 ENCOUNTER — Encounter: Payer: Self-pay | Admitting: Urology

## 2016-01-19 ENCOUNTER — Ambulatory Visit (INDEPENDENT_AMBULATORY_CARE_PROVIDER_SITE_OTHER): Payer: PPO | Admitting: Urology

## 2016-01-19 VITALS — BP 138/78 | HR 71 | Ht 64.5 in | Wt 156.7 lb

## 2016-01-19 DIAGNOSIS — R32 Unspecified urinary incontinence: Secondary | ICD-10-CM

## 2016-01-19 MED ORDER — FESOTERODINE FUMARATE ER 4 MG PO TB24: 4.0000 mg | ORAL_TABLET | Freq: Every day | ORAL | Status: DC

## 2016-01-19 NOTE — Progress Notes (Signed)
01/19/2016 10:00 AM   Lauren [name] 07/23/1941 [id number]  Referring provider: Leone Haven, MD [place], [place] [address]  Chief Complaint  Patient presents with  . Follow-up    bladder prolapse, urinary incontinence. The pt was taking Vesicare, no improvement. Switched to [employer] 4MG  and have notice drastic improvement.    HPI: The patient is a 75 year old woman who has worsening incontinence over 1 year. She says she does not leak with stress maneuvers or with urgency. She leaks a small amount not associated with awareness during activity levels. She has a little bit of dampness some nights. She wears 3 pads per day moderately wet.  On the patient's last visit she had a small grade 2 cystocele and a double suburethral swelling noted. I felt that she had leakage is not associated awareness and mild enuresis and my index of suspicion is low that she had a diverticulum. I did order an MRI and this was normal I was going to start medication empirically and only or urodynamics if she does not reach her treatment goal    The patient is here today to be assessed on Vesicare.   PMH: Past Medical History  Diagnosis Date  . Chickenpox   . Allergic rhinitis   . Migraines   . Urinary incontinence   . Incontinence of urine in female 10/05/2015  . Elevated LDL cholesterol level 11/26/2015    Surgical History: Past Surgical History  Procedure Laterality Date  . Cholecystectomy  1995  . Tonsillectomy  1962    Home Medications:    Medication List       This list is accurate as of: 01/19/16 10:00 AM.  Always use your most recent med list.               alendronate 70 MG tablet  Commonly known as:  FOSAMAX  Take 1 tablet (70 mg total) by mouth every 7 (seven) days. Take with a full glass of water on an empty stomach.     solifenacin 5 MG tablet  Commonly known as:  VESICARE  Take 1 tablet (5 mg total) by mouth daily.     TOVIAZ 4 MG Tb24 tablet    Generic drug:  fesoterodine  Take 4 mg by mouth daily.        Allergies:  Allergies  Allergen Reactions  . Eggs Or Egg-Derived Products   . Hydrocodone-Guaifenesin Other (See Comments)    Cant sleep   . Mobic [Meloxicam] Swelling  . Sulfa Antibiotics Other (See Comments)    Cold sweats, passing out   . Codeine Rash    Cold sweats     Family History: Family History  Problem Relation Age of Onset  . Arthritis    . Heart disease    . Hypertension    . Hematuria Father     Social History:  reports that she has quit smoking. She has never used smokeless tobacco. She reports that she does not drink alcohol or use illicit drugs.  ROS:  Physical Exam: BP 138/78 mmHg  Pulse 71  Ht 5' 4.5" (1.638 m)  Wt 156 lb 11.2 oz (71.079 kg)  BMI 26.49 kg/m2  Constitutional:  Alert and oriented, No acute distress.  Laboratory Data: No results found for: WBC, HGB, HCT, MCV, PLT  Lab Results  Component Value Date   CREATININE 1.00 11/24/2015     Urinalysis    Component Value Date/Time   APPEARANCEUR Clear 11/03/2015 1048  GLUCOSEU Negative 11/03/2015 1048   BILIRUBINUR Negative 11/03/2015 1048   PROTEINUR Negative 11/03/2015 1048   NITRITE Negative 11/03/2015 1048   LEUKOCYTESUR Negative 11/03/2015 1048    Pertinent Imaging: none  Assessment & Plan:  90% better on Toviaz 4 mg with no response to Vesicare. Mild constipation managed with food such as prune dissector. Her scription given. See in 4 months. 2 weeks of samples given  There are no diagnoses linked to this encounter.  No Follow-up on file.  Reece Packer, MD  [facility] [address], [place], [place] [address] [phone]

## 2016-02-01 ENCOUNTER — Telehealth: Payer: Self-pay | Admitting: Urology

## 2016-02-01 NOTE — Telephone Encounter (Signed)
Patient was told to call back to let dr.Macdiarmid know if her insurance covered Toviaz or not. She called and it does not, it would cost her $275.00 a month and she is not able to afford this. She is not going to get this filled.   Thanks,  Sharyn Lull

## 2016-02-21 ENCOUNTER — Ambulatory Visit (INDEPENDENT_AMBULATORY_CARE_PROVIDER_SITE_OTHER): Payer: PPO | Admitting: Family Medicine

## 2016-02-21 ENCOUNTER — Encounter: Payer: Self-pay | Admitting: Family Medicine

## 2016-02-21 VITALS — BP 118/66 | HR 83 | Temp 98.1°F | Ht 64.5 in | Wt 158.6 lb

## 2016-02-21 DIAGNOSIS — R32 Unspecified urinary incontinence: Secondary | ICD-10-CM

## 2016-02-21 DIAGNOSIS — E7800 Pure hypercholesterolemia, unspecified: Secondary | ICD-10-CM

## 2016-02-21 DIAGNOSIS — M810 Age-related osteoporosis without current pathological fracture: Secondary | ICD-10-CM | POA: Diagnosis not present

## 2016-02-21 NOTE — Assessment & Plan Note (Signed)
Sounds as though this is urge incontinence. Much improved on Toviaz. Following with urology.

## 2016-02-21 NOTE — Patient Instructions (Signed)
Nice to see you. Please start on calcium supplements daily. This is over-the-counter. You can also take vitamin D over-the-counter. Please continue your Toviaz. These continued diet and exercise. We will see you back in 3 months.

## 2016-02-21 NOTE — Progress Notes (Signed)
Patient ID: Lauren [name], female   DOB: [birth year], 75 y.o.   MRN: [id number]  Tommi Rumps, MD Phone: [phone]  Lauren [name] is a 75 y.o. female who presents today for f/u.  HYPERLIPIDEMIA Symptoms Chest pain on exertion:  no   Leg claudication:   no Not currently on medications. Working on diet and exercise. Walks 2 miles a day. Works in the yard. Fleets grilled meats. Lots of vegetables. 2 meals a day. Avoids fried fatty foods.  Osteoporosis: was on fosamax, though had constipation with this. Stopped fosamax due to this and constipation improved. Also notes prunes help with constipation. Taking vitamin D. Not taking calcium at this time. She had no abdominal pain with constipation. This is resolved.  Urge incontinence: Much improved on Toviaz. Rarely leaks. No abdominal pain. Following with urology.   PMH: Former smoker   ROS see history of present illness  Objective  Physical Exam Filed Vitals:   02/21/16 1329  BP: 118/66  Pulse: 83  Temp: 98.1 F (36.7 C)    BP Readings from Last 3 Encounters:  02/21/16 118/66  01/19/16 138/78  11/29/15 126/73   Wt Readings from Last 3 Encounters:  02/21/16 158 lb 9.6 oz (71.94 kg)  01/19/16 156 lb 11.2 oz (71.079 kg)  11/29/15 158 lb (71.668 kg)    Physical Exam  Constitutional: She is well-developed, well-nourished, and in no distress.  HENT:  Head: Normocephalic and atraumatic.  Right Ear: External ear normal.  Left Ear: External ear normal.  Cardiovascular: Normal rate, regular rhythm and normal heart sounds.   Pulmonary/Chest: Effort normal and breath sounds normal.  Abdominal: Soft. Bowel sounds are normal. She exhibits no distension. There is no tenderness. There is no rebound and no guarding.  Neurological: She is alert. Gait normal.  Skin: Skin is warm and dry. She is not diaphoretic.     Assessment/Plan: Please see individual problem list.  Elevated LDL cholesterol level Slightly elevated previously  managing with diet and exercise. Continue to work on diet and exercise. We'll plan on rechecking LDL and next office visit.  Incontinence of urine in female Sounds as though this is urge incontinence. Much improved on Toviaz. Following with urology.  Osteoporosis Did not tolerate Fosamax due to constipation. Benign abdominal exam. Is not taking this at this time. Discussed that there are other options that she wanted to stick with vitamin D supplementation and calcium supplementation.    Tommi Rumps, MD [place]

## 2016-02-21 NOTE — Assessment & Plan Note (Signed)
Did not tolerate Fosamax due to constipation. Benign abdominal exam. Is not taking this at this time. Discussed that there are other options that she wanted to stick with vitamin D supplementation and calcium supplementation.

## 2016-02-21 NOTE — Progress Notes (Signed)
Pre visit review using our clinic review tool, if applicable. No additional management support is needed unless otherwise documented below in the visit note. 

## 2016-02-21 NOTE — Assessment & Plan Note (Signed)
Slightly elevated previously managing with diet and exercise. Continue to work on diet and exercise. We'll plan on rechecking LDL and next office visit.

## 2016-03-25 ENCOUNTER — Telehealth: Payer: Self-pay | Admitting: Urology

## 2016-03-25 NOTE — Telephone Encounter (Signed)
Tell her to come pick up 4 weeks of samples per Larene Beach.

## 2016-03-25 NOTE — Telephone Encounter (Signed)
Pt called again regarding cost of Toviaz. Asks if there is another alternative. Please advise.

## 2016-03-25 NOTE — Telephone Encounter (Signed)
Notified pt of samples left at front desk. Pt states she will pick them up on 03/26/16.

## 2016-05-20 ENCOUNTER — Ambulatory Visit: Payer: PPO

## 2016-05-24 ENCOUNTER — Encounter (INDEPENDENT_AMBULATORY_CARE_PROVIDER_SITE_OTHER): Payer: Self-pay

## 2016-05-24 ENCOUNTER — Ambulatory Visit (INDEPENDENT_AMBULATORY_CARE_PROVIDER_SITE_OTHER): Payer: PPO | Admitting: Family Medicine

## 2016-05-24 ENCOUNTER — Encounter: Payer: Self-pay | Admitting: Family Medicine

## 2016-05-24 ENCOUNTER — Ambulatory Visit (INDEPENDENT_AMBULATORY_CARE_PROVIDER_SITE_OTHER): Payer: PPO

## 2016-05-24 VITALS — BP 122/76 | HR 68 | Temp 98.6°F | Wt 158.1 lb

## 2016-05-24 DIAGNOSIS — Z887 Allergy status to serum and vaccine status: Secondary | ICD-10-CM | POA: Diagnosis not present

## 2016-05-24 DIAGNOSIS — E7800 Pure hypercholesterolemia, unspecified: Secondary | ICD-10-CM

## 2016-05-24 DIAGNOSIS — M79672 Pain in left foot: Secondary | ICD-10-CM | POA: Diagnosis not present

## 2016-05-24 DIAGNOSIS — M7989 Other specified soft tissue disorders: Secondary | ICD-10-CM | POA: Diagnosis not present

## 2016-05-24 LAB — COMPREHENSIVE METABOLIC PANEL
ALT: 10 U/L (ref 0–35)
AST: 18 U/L (ref 0–37)
Albumin: 4.1 g/dL (ref 3.5–5.2)
Alkaline Phosphatase: 54 U/L (ref 39–117)
BILIRUBIN TOTAL: 0.6 mg/dL (ref 0.2–1.2)
BUN: 17 mg/dL (ref 6–23)
CO2: 32 meq/L (ref 19–32)
Calcium: 9.2 mg/dL (ref 8.4–10.5)
Chloride: 107 mEq/L (ref 96–112)
Creatinine, Ser: 0.92 mg/dL (ref 0.40–1.20)
GFR: 63.52 mL/min (ref >60.00)
GLUCOSE: 86 mg/dL (ref 70–99)
Potassium: 4.3 mEq/L (ref 3.5–5.1)
Sodium: 143 mEq/L (ref 135–145)
Total Protein: 6.9 g/dL (ref 6.0–8.3)

## 2016-05-24 LAB — LDL CHOLESTEROL, DIRECT: LDL DIRECT: 118 mg/dL

## 2016-05-24 NOTE — Progress Notes (Signed)
Pre visit review using our clinic review tool, if applicable. No additional management support is needed unless otherwise documented below in the visit note. 

## 2016-05-24 NOTE — Assessment & Plan Note (Signed)
Patient reports allergy to influenza vaccine several decades ago. We will add this to her allergy list and this will preclude her from getting the flu vaccination.

## 2016-05-24 NOTE — Patient Instructions (Signed)
Nice to see you. We are going to check your cholesterol again. We'll get an x-ray of your left foot and then likely refer you to sports medicine.

## 2016-05-24 NOTE — Assessment & Plan Note (Signed)
Patient with left foot discomfort. Mild swelling. We will obtain an x-ray to evaluate further. Once this returns we'll determine the next step in management whether that be orthopedic surgery or sports medicine or podiatry.

## 2016-05-24 NOTE — Progress Notes (Signed)
Tommi Rumps, MD Phone: [phone]  Lauren [name] is a 75 y.o. female who presents today for follow-up.  Elevated LDL: Patient has been trying to manage this with diet and exercise. She walks though not as much as previously. She is avoiding beef and pork. Lots of chicken and vegetables. No claudication, chest pain, or shortness of breath.  Left foot pain: Patient notes a history of plantar fasciitis and left foot. Does note some discomfort at times on the underside of her foot. More recently she has had discomfort on the dorsum of her foot near her ankle. Notes her foot will cramp up and feel like a rubber band is pulling. Notes some tingling in her foot when this happens. No numbness. No weakness. Notes her ankle and top of her foot will swell with this. No calf swelling. No recent surgeries. No recent travel. She's not on estrogen.  Patient also reports that she has gotten sick with the flu vaccination previously. Had this in the 1960s and was sick for 10 weeks following this. Had nausea and vomiting.  PMH: Former smoker.   ROS see history of present illness  Objective  Physical Exam Vitals:   05/24/16 1008  BP: 122/76  Pulse: 68  Temp: 98.6 F (37 C)    BP Readings from Last 3 Encounters:  05/24/16 122/76  02/21/16 118/66  01/19/16 138/78   Wt Readings from Last 3 Encounters:  05/24/16 158 lb 2 oz (71.7 kg)  02/21/16 158 lb 9.6 oz (71.9 kg)  01/19/16 156 lb 11.2 oz (71.1 kg)    Physical Exam  Constitutional: She is well-developed, well-nourished, and in no distress.  Cardiovascular: Normal rate, regular rhythm and normal heart sounds.   Pulmonary/Chest: Effort normal and breath sounds normal.  Musculoskeletal:  Left ankle and foot with mild swelling around the ankle and dorsum of the foot, no tenderness of either malleoli, fifth had a metatarsal, fifth metatarsal, or navicular bone, there is no tenderness over the dorsum of the foot, there is no warmth or  erythema, 2+ DP and PT pulses, right ankle and foot with no swelling or tenderness, 2+ DP and PT pulses, left calf 34.5 cm, right calf 33.5 cm  Neurological: She is alert. Gait normal.  5 out of 5 strength bilateral quads, hamstrings, plantar flexion, and dorsiflexion, sensation to light touch intact in bilateral lower extremities  Skin: Skin is warm and dry.     Assessment/Plan: Please see individual problem list.  Elevated LDL cholesterol level Continue diet and exercise. Check LDL today.  Left foot pain Patient with left foot discomfort. Mild swelling. We will obtain an x-ray to evaluate further. Once this returns we'll determine the next step in management whether that be orthopedic surgery or sports medicine or podiatry.  Allergy to influenza vaccine Patient reports allergy to influenza vaccine several decades ago. We will add this to her allergy list and this will preclude her from getting the flu vaccination.   Orders Placed This Encounter  Procedures  . DG Foot Complete Left    Standing Status:   Future    Number of Occurrences:   1    Standing Expiration Date:   07/24/2017    Order Specific Question:   Reason for Exam (SYMPTOM  OR DIAGNOSIS REQUIRED)    Answer:   left foot pain and swelling    Order Specific Question:   Preferred imaging location?    Answer:   [facility]  . Direct LDL  .  Comp Met (CMET)    Tommi Rumps, MD [place]

## 2016-05-24 NOTE — Assessment & Plan Note (Signed)
Continue diet and exercise. Check LDL today.

## 2016-05-27 ENCOUNTER — Ambulatory Visit: Payer: PPO

## 2016-05-27 ENCOUNTER — Other Ambulatory Visit: Payer: Self-pay | Admitting: Family Medicine

## 2016-05-27 DIAGNOSIS — M79672 Pain in left foot: Secondary | ICD-10-CM

## 2016-05-28 ENCOUNTER — Telehealth: Payer: Self-pay | Admitting: Family Medicine

## 2016-05-28 NOTE — Telephone Encounter (Signed)
Pt called and had a few questions as to what Dr. Ellen Henri plan was for the results of her xray. Thank you!  Call pt @ [phone]

## 2016-05-28 NOTE — Telephone Encounter (Signed)
Pt called back regarding results. Thank you!  Call pt @ [phone]

## 2016-05-29 NOTE — Telephone Encounter (Signed)
Patient advised that referral was placed and they will manage treatment

## 2016-05-29 NOTE — Telephone Encounter (Signed)
Looks like referral was ordered

## 2016-05-29 NOTE — Telephone Encounter (Signed)
Referral was placed to sports medicine. I would like to see what they say regarding her discomfort.

## 2016-06-06 ENCOUNTER — Encounter: Payer: Self-pay | Admitting: Family Medicine

## 2016-06-11 ENCOUNTER — Telehealth: Payer: Self-pay | Admitting: *Deleted

## 2016-06-11 NOTE — Telephone Encounter (Signed)
Patient has requested to have her referral changed to another provider other than Dr.Hyatt at [facility]. Her scheduled appt is 06/26/16 She requested to see Dr. Mack Guise at [facility]  Pt contact  [phone]

## 2016-06-25 ENCOUNTER — Ambulatory Visit (INDEPENDENT_AMBULATORY_CARE_PROVIDER_SITE_OTHER): Payer: PPO | Admitting: Podiatry

## 2016-06-25 VITALS — BP 130/72 | HR 82 | Temp 97.1°F | Resp 16

## 2016-06-25 DIAGNOSIS — M25572 Pain in left ankle and joints of left foot: Secondary | ICD-10-CM

## 2016-06-25 DIAGNOSIS — M7752 Other enthesopathy of left foot: Secondary | ICD-10-CM

## 2016-06-25 DIAGNOSIS — M79673 Pain in unspecified foot: Secondary | ICD-10-CM

## 2016-06-25 DIAGNOSIS — M659 Synovitis and tenosynovitis, unspecified: Secondary | ICD-10-CM

## 2016-06-26 ENCOUNTER — Ambulatory Visit: Payer: Self-pay | Admitting: Podiatry

## 2016-06-30 MED ORDER — BETAMETHASONE SOD PHOS & ACET 6 (3-3) MG/ML IJ SUSP: 3.0000 mg | Freq: Once | INTRAMUSCULAR | Status: AC

## 2016-06-30 NOTE — Progress Notes (Signed)
Subjective:  Patient presents today for pain and tenderness to the left ankle. Patient relates significant pain and tenderness when walking.  Patient presents for further treatment and evaluation.  Objective / Physical Exam:  General:  The patient is alert and oriented x3 in no acute distress. Dermatology:  Skin is warm, dry and supple bilateral lower extremities. Negative for open lesions or macerations. Vascular:  Palpable pedal pulses bilaterally. No edema or erythema noted. Capillary refill within normal limits. Neurological:  Epicritic and protective threshold grossly intact bilaterally.  Musculoskeletal Exam:  Pain on palpation to the anterior lateral medial aspects of the patient's left ankle. Mild edema noted.  Range of motion within normal limits to all pedal and ankle joints bilateral. Muscle strength 5/5 in all groups bilateral.   Radiographic Exam:  Normal osseous mineralization. Joint spaces preserved. No fracture/dislocation/boney destruction.    Assessment: #1 pain in left ankle #2 synovitis of left ankle #3 capsulitis of left ankle #4 tight plantar fascia-nonpainful left  Plan of Care:  #1 Patient was evaluated. #2 injection of 0.5 mL Celestone Soluspan injected in the patient's left ankle. #3 today compression anklet was dispensed #4 continue plantar fascial brace #52 Night splint as needed  Dr. Edrick Kins, [place]

## 2016-07-08 ENCOUNTER — Ambulatory Visit: Payer: PPO | Admitting: Urology

## 2016-07-08 VITALS — BP 150/88 | HR 89 | Ht 64.0 in | Wt 158.0 lb

## 2016-07-08 DIAGNOSIS — N3946 Mixed incontinence: Secondary | ICD-10-CM

## 2016-07-08 DIAGNOSIS — R350 Frequency of micturition: Secondary | ICD-10-CM

## 2016-07-08 MED ORDER — TOLTERODINE TARTRATE ER 4 MG PO CP24: 4.0000 mg | ORAL_CAPSULE | Freq: Every day | ORAL | 11 refills | Status: DC

## 2016-07-08 NOTE — Progress Notes (Signed)
07/08/2016 2:41 PM   Lauren [name] 1940-11-25 [id number]  Referring provider: Leone Haven, MD [place], [place] [address]  Chief Complaint  Patient presents with  . Urinary Incontinence    4 month follow up    HPI: The patient is a 75 year old woman who has worsening incontinence over 1 year. She says she does not leak with stress maneuvers or with urgency. She leaks a small amount not associated with awareness during activity levels. She has a little bit of dampness some nights. She wears 3 pads per day moderately wet.  On the patient's last visit she had a small grade 2 cystocele and a double suburethral swelling noted. I felt that she had leakage is not associated awareness and mild enuresis and my index of suspicion is low that she had a diverticulum. 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 Unfortunately her insurance company would not cover it. On medication her frequency and urge incontinence was much better  Clinically noninfected     PMH: Past Medical History:  Diagnosis Date  . Allergic rhinitis   . Chickenpox   . Elevated LDL cholesterol level 11/26/2015  . Incontinence of urine in female 10/05/2015  . Migraines   . Urinary incontinence     Surgical History: Past Surgical History:  Procedure Laterality Date  . CHOLECYSTECTOMY  1995  . TONSILLECTOMY  1962    Home Medications:    Medication List       Accurate as of 07/08/16  2:41 PM. Always use your most recent med list.          fesoterodine 4 MG Tb24 tablet Commonly known as:  TOVIAZ Take 1 tablet (4 mg total) by mouth daily.       Allergies:  Allergies  Allergen Reactions  . Eggs Or  Egg-Derived Products   . Hydrocodone-Guaifenesin Other (See Comments)    Cant sleep   . Influenza Vaccines Other (See Comments)    Nausea and vomiting and stomach pain for 10 weeks  . Mobic [Meloxicam] Swelling  . Sulfa Antibiotics Other (See Comments)    Cold sweats, passing out   . Codeine Rash    Cold sweats     Family History: Family History  Problem Relation Age of Onset  . Arthritis    . Heart disease    . Hypertension    . Hematuria Father     Social History:  reports that she has quit smoking. She has never used smokeless tobacco. She reports that she does not drink alcohol or use drugs.  ROS: UROLOGY Frequent Urination?: No Hard to postpone urination?: No Burning/pain with urination?: No Get up at night to urinate?: No Leakage of urine?: Yes Urine stream starts and stops?: No Trouble starting stream?: No Do you have to strain to urinate?: No Blood in urine?: No Urinary tract infection?: No Sexually transmitted disease?: No Injury to kidneys or bladder?: No Painful intercourse?: No Weak stream?: No Currently pregnant?: No Vaginal bleeding?: No Last menstrual period?: n  Gastrointestinal Nausea?: No Vomiting?: No Indigestion/heartburn?: No Diarrhea?: No Constipation?: No  Constitutional Fever: No Night sweats?: No Weight loss?: No Fatigue?: No  Skin Skin  rash/lesions?: No Itching?: No  Eyes Blurred vision?: No Double vision?: No  Ears/Nose/Throat Sore throat?: No Sinus problems?: No  Hematologic/Lymphatic Swollen glands?: No Easy bruising?: No  Cardiovascular Leg swelling?: No Chest pain?: No  Respiratory Cough?: No Shortness of breath?: No  Endocrine Excessive thirst?: No  Musculoskeletal Back pain?: No Joint pain?: No  Neurological Headaches?: No Dizziness?: No  Psychologic Depression?: No Anxiety?: No  Physical Exam: BP (!) 150/88   Pulse 89   Ht 5\' 4"  (1.626 m)   Wt 158 lb (71.7 kg)   BMI 27.12 kg/m      Laboratory Data: No results found for: WBC, HGB, HCT, MCV, PLT  Lab Results  Component Value Date   CREATININE 0.92 05/24/2016    No results found for: PSA  No results found for: TESTOSTERONE  No results found for: HGBA1C  Urinalysis    Component Value Date/Time   APPEARANCEUR Clear 11/03/2015 1048   GLUCOSEU Negative 11/03/2015 1048   BILIRUBINUR Negative 11/03/2015 1048   PROTEINUR Negative 11/03/2015 1048   NITRITE Negative 11/03/2015 1048   LEUKOCYTESUR Negative 11/03/2015 1048    Pertinent Imaging: None  Assessment & Plan:  Clinically this supports that the leakage not associated with awareness was due to bladder overactivity.  I gave the patient Detrol LA 4 mg 30 tablets and 11 refills. We hand-delivered and oxybutynin twice a day prescription of 5 mg of the Detrol does not reach her treatment goal. I will reevaluate her in 8 weeks  There are no diagnoses linked to this encounter.  Return in about 2 months (around 09/07/2016).  Reece Packer, MD  [facility] [address], [place], [place] [address] [phone]

## 2016-08-30 ENCOUNTER — Encounter: Payer: Self-pay | Admitting: Family Medicine

## 2016-08-30 ENCOUNTER — Ambulatory Visit (INDEPENDENT_AMBULATORY_CARE_PROVIDER_SITE_OTHER): Payer: PPO | Admitting: Family Medicine

## 2016-08-30 VITALS — BP 138/76 | HR 78 | Temp 98.4°F | Ht 64.0 in | Wt 159.2 lb

## 2016-08-30 DIAGNOSIS — E7800 Pure hypercholesterolemia, unspecified: Secondary | ICD-10-CM | POA: Diagnosis not present

## 2016-08-30 DIAGNOSIS — Z8619 Personal history of other infectious and parasitic diseases: Secondary | ICD-10-CM

## 2016-08-30 DIAGNOSIS — L853 Xerosis cutis: Secondary | ICD-10-CM | POA: Diagnosis not present

## 2016-08-30 DIAGNOSIS — M79672 Pain in left foot: Secondary | ICD-10-CM | POA: Diagnosis not present

## 2016-08-30 DIAGNOSIS — R32 Unspecified urinary incontinence: Secondary | ICD-10-CM | POA: Diagnosis not present

## 2016-08-30 MED ORDER — TRIAMCINOLONE ACETONIDE 0.1 % EX CREA: TOPICAL_CREAM | CUTANEOUS | 0 refills | Status: DC

## 2016-08-30 NOTE — Progress Notes (Signed)
Tommi Rumps, MD Phone: [phone]  Lauren [name] is a 76 y.o. female who presents today for f/u.  HYPERLIPIDEMIA Symptoms Chest pain on exertion:  no   Leg claudication:   no Diet and exercise controlled. Eating lots of chicken. Lots of vegetables. No fried or fatty foods. Was walking a lot prior to getting the wart on her foot.  Urinary incontinence: Followed by urology. Currently on oxybutynin which has helped tremendously. Leaks 1-2 times a day. Has had some dry mouth following the second dose during the day.  Possible plantars wart on left foot. Has an area of thickened skin that may be a wart or callus. Was advised by podiatry this is not a wart. Has been advised several years ago by her former PCP this is not a wart. Notes there is point tenderness in this area. Also notes a scab on the inner aspect of her left little toe that she relates to wearing the compression brace for her left ankle. No erythema or tenderness. No drainage.  Has a history of shingles. No symptoms now. Has had them 3 times. No prior shingles vaccine.  Patient does have dry itchy skin on her legs. First occurred after having her varicose veins stripped a number of years ago. Was prescribed triamcinolone ointment for this by the vascular surgeon. Has not had any issues with this until recently. Has a dry itchy patch of skin on the outer aspect of her left lower leg.   PMH: Former smoker   ROS see history of present illness  Objective  Physical Exam Vitals:   08/30/16 0916 08/30/16 0957  BP: (!) 146/70 138/76  Pulse: 78   Temp: 98.4 F (36.9 C)     BP Readings from Last 3 Encounters:  08/30/16 138/76  07/08/16 (!) 150/88  06/25/16 130/72   Wt Readings from Last 3 Encounters:  08/30/16 159 lb 3.2 oz (72.2 kg)  07/08/16 158 lb (71.7 kg)  05/24/16 158 lb 2 oz (71.7 kg)    Physical Exam  Constitutional: No distress.  Cardiovascular: Normal rate, regular rhythm and normal heart sounds.     Pulmonary/Chest: Effort normal and breath sounds normal.  Abdominal: Soft. She exhibits no distension. There is no tenderness.  Musculoskeletal: She exhibits no edema.  Left foot near the fifth MTP joint with a thickened callus that is possibly a wart, it is tender, there is no surrounding erythema or warmth, inner aspect of left little toe with small scab, nontender, no erythema or drainage  Neurological: She is alert. Gait normal.  Skin: Skin is warm and dry. She is not diaphoretic.  Dry skin on left lateral lower leg, no erythema or rash noted     Assessment/Plan: Please see individual problem list.  Elevated LDL cholesterol level Continue to work on diet and exercise. Plan to recheck cholesterol next visit.  Incontinence of urine in female Overall doing better with this. She'll continue oxybutynin. Discussed monitoring the dry mouth and discussing with her urologist.  Left foot pain Area of callus could be a wart. Could just be callus formation as well. No signs of infection there or on the inner aspect of her left little toe. Discussed salicylic acid pads for the possible wart. Discussed referral to a different podiatrist.  Dry skin Dry skin on left lower leg. Has been a chronic intermittent issue that has responded to triamcinolone in the past. We will refill the triamcinolone. She will continue to monitor.  History of shingles Patient with a  history of shingles. No symptoms at this time. Discussed the new shingles vaccine with her and she will wait for this to come out to get the shingles vaccine.   Orders Placed This Encounter  Procedures  . Ambulatory referral to Podiatry    Referral Priority:   Routine    Referral Type:   Consultation    Referral Reason:   Specialty Services Required    Requested Specialty:   Podiatry    Number of Visits Requested:   1    Tommi Rumps, MD [place]

## 2016-08-30 NOTE — Patient Instructions (Signed)
Nice to see you. I have prescribed the triamcinolone. Please try salicylic acid pads from Dr. Felicie Morn on the area on your left foot that may be a wart. We will get you to see podiatry in [facility] for this as well.

## 2016-08-30 NOTE — Progress Notes (Signed)
Pre visit review using our clinic review tool, if applicable. No additional management support is needed unless otherwise documented below in the visit note. 

## 2016-08-31 DIAGNOSIS — Z8619 Personal history of other infectious and parasitic diseases: Secondary | ICD-10-CM | POA: Insufficient documentation

## 2016-08-31 DIAGNOSIS — L853 Xerosis cutis: Secondary | ICD-10-CM | POA: Insufficient documentation

## 2016-08-31 NOTE — Assessment & Plan Note (Signed)
Area of callus could be a wart. Could just be callus formation as well. No signs of infection there or on the inner aspect of her left little toe. Discussed salicylic acid pads for the possible wart. Discussed referral to a different podiatrist.

## 2016-08-31 NOTE — Assessment & Plan Note (Signed)
Patient with a history of shingles. No symptoms at this time. Discussed the new shingles vaccine with her and she will wait for this to come out to get the shingles vaccine.

## 2016-08-31 NOTE — Assessment & Plan Note (Signed)
Overall doing better with this. She'll continue oxybutynin. Discussed monitoring the dry mouth and discussing with her urologist.

## 2016-08-31 NOTE — Assessment & Plan Note (Signed)
Dry skin on left lower leg. Has been a chronic intermittent issue that has responded to triamcinolone in the past. We will refill the triamcinolone. She will continue to monitor.

## 2016-08-31 NOTE — Assessment & Plan Note (Signed)
Continue to work on diet and exercise. Plan to recheck cholesterol next visit.

## 2016-09-09 ENCOUNTER — Encounter: Payer: Self-pay | Admitting: Urology

## 2016-09-09 ENCOUNTER — Ambulatory Visit: Payer: PPO | Admitting: Urology

## 2016-09-09 VITALS — BP 166/77 | HR 85 | Ht 64.0 in | Wt 157.0 lb

## 2016-09-09 DIAGNOSIS — N3946 Mixed incontinence: Secondary | ICD-10-CM | POA: Diagnosis not present

## 2016-09-09 MED ORDER — OXYBUTYNIN CHLORIDE ER 10 MG PO TB24: 10.0000 mg | ORAL_TABLET | Freq: Every day | ORAL | 11 refills | Status: DC

## 2016-09-09 NOTE — Progress Notes (Signed)
09/09/2016 8:49 AM   Lauren [name] 11-19-1940 [id number]  Referring provider: Leone Haven, MD Lauren [name], [place] [address]  Chief Complaint  Patient presents with  . Urinary Incontinence    8wk follow up    HPI: The patient is a 76 year old woman who has worsening incontinence over 1 year. She says she does not leak with stress maneuvers or with urgency. She leaks a small amount not associated with awareness during activity levels. She has a little bit of dampness some nights. She wears 3 pads per day moderately wet.  On the patient's last visit she had a small grade 2 cystocele and a double suburethral swelling noted. I felt that she had leakage is not associated awareness and mild enuresis and my index of suspicion is low that she had a diverticulum. I did order an MRI and this was normal I was going to start medication empirically and only or urodynamics if she does not reach her treatment goal  There was no evidence of urethral diverticulum on MRI   Today Patient was 80% better on oxybutynin twice a day and Detrol failed. Unfortunately she would get dry mouth at night. Lisbeth Ply worked but it was too expensive. Vesicare failed.  Clinically noninfected     PMH: Past Medical History:  Diagnosis Date  . Allergic rhinitis   . Chickenpox   . Elevated LDL cholesterol level 11/26/2015  . Incontinence of urine in female 10/05/2015  . Migraines   . Urinary incontinence     Surgical History: Past Surgical History:  Procedure Laterality Date  . CHOLECYSTECTOMY  1995  . TONSILLECTOMY  1962    Home Medications:  Allergies as of 09/09/2016      Reactions   Eggs Or Egg-derived Products    Hydrocodone-guaifenesin Other (See Comments)   Cant sleep    Influenza Vaccines Other (See Comments)   Nausea and vomiting and stomach pain for 10 weeks   Mobic [meloxicam] Swelling   Sulfa Antibiotics Other (See Comments)   Cold sweats, passing out    Codeine  Rash   Cold sweats       Medication List       Accurate as of 09/09/16  8:49 AM. Always use your most recent med list.          oxybutynin 5 MG tablet Commonly known as:  DITROPAN   triamcinolone cream 0.1 % Commonly known as:  KENALOG Apply 1-2 times daily to rash       Allergies:  Allergies  Allergen Reactions  . Eggs Or Egg-Derived Products   . Hydrocodone-Guaifenesin Other (See Comments)    Cant sleep   . Influenza Vaccines Other (See Comments)    Nausea and vomiting and stomach pain for 10 weeks  . Mobic [Meloxicam] Swelling  . Sulfa Antibiotics Other (See Comments)    Cold sweats, passing out   . Codeine Rash    Cold sweats     Family History: Family History  Problem Relation Age of Onset  . Arthritis    . Heart disease    . Hypertension    . Hematuria Father     Social History:  reports that she has quit smoking. She has never used smokeless tobacco. She reports that she does not drink alcohol or use drugs.  ROS: UROLOGY Frequent Urination?: Yes Hard to postpone urination?: No Burning/pain with urination?: No Get up at night to urinate?: No Leakage of urine?: No Urine stream starts and  stops?: No Trouble starting stream?: No Do you have to strain to urinate?: No Blood in urine?: No Urinary tract infection?: No Sexually transmitted disease?: No Injury to kidneys or bladder?: No Painful intercourse?: No Weak stream?: No Currently pregnant?: No Vaginal bleeding?: No Last menstrual period?: n  Gastrointestinal Nausea?: No Vomiting?: No Indigestion/heartburn?: No Diarrhea?: No Constipation?: No  Constitutional Fever: No Night sweats?: No Weight loss?: No Fatigue?: No  Skin Skin rash/lesions?: No Itching?: No  Eyes Blurred vision?: No Double vision?: No  Ears/Nose/Throat Sore throat?: No Sinus problems?: No  Hematologic/Lymphatic Swollen glands?: No Easy bruising?: No  Cardiovascular Leg swelling?: No Chest pain?:  No  Respiratory Cough?: No Shortness of breath?: No  Endocrine Excessive thirst?: No  Musculoskeletal Back pain?: No Joint pain?: No  Neurological Headaches?: No Dizziness?: No  Psychologic Depression?: No Anxiety?: No  Physical Exam: BP (!) 176/82   Pulse 85   Ht 5\' 4"  (1.626 m)   Wt 157 lb (71.2 kg)   BMI 26.95 kg/m     Laboratory Data: No results found for: WBC, HGB, HCT, MCV, PLT  Lab Results  Component Value Date   CREATININE 0.92 05/24/2016    No results found for: PSA  No results found for: TESTOSTERONE  No results found for: HGBA1C  Urinalysis    Component Value Date/Time   APPEARANCEUR Clear 11/03/2015 1048   GLUCOSEU Negative 11/03/2015 1048   BILIRUBINUR Negative 11/03/2015 1048   PROTEINUR Negative 11/03/2015 1048   NITRITE Negative 11/03/2015 1048   LEUKOCYTESUR Negative 11/03/2015 1048    Pertinent Imaging: none  Assessment & Plan:  The patient will be started on oxybutynin ER 10 mg. I will reassess her in 1 month. Based upon chart review and history I don't think she is ever tried the beta 3 agonist  There are no diagnoses linked to this encounter.  No Follow-up on file.  Reece Packer, MD  [facility] [address] [place], [place] [address] [phone]

## 2016-09-16 DIAGNOSIS — M71572 Other bursitis, not elsewhere classified, left ankle and foot: Secondary | ICD-10-CM | POA: Diagnosis not present

## 2016-09-16 DIAGNOSIS — M19072 Primary osteoarthritis, left ankle and foot: Secondary | ICD-10-CM | POA: Diagnosis not present

## 2016-09-16 DIAGNOSIS — M722 Plantar fascial fibromatosis: Secondary | ICD-10-CM | POA: Diagnosis not present

## 2016-09-16 DIAGNOSIS — M7732 Calcaneal spur, left foot: Secondary | ICD-10-CM | POA: Diagnosis not present

## 2016-09-16 DIAGNOSIS — M659 Synovitis and tenosynovitis, unspecified: Secondary | ICD-10-CM | POA: Diagnosis not present

## 2016-09-23 DIAGNOSIS — M71572 Other bursitis, not elsewhere classified, left ankle and foot: Secondary | ICD-10-CM | POA: Diagnosis not present

## 2016-09-23 DIAGNOSIS — M722 Plantar fascial fibromatosis: Secondary | ICD-10-CM | POA: Diagnosis not present

## 2016-09-23 DIAGNOSIS — M659 Synovitis and tenosynovitis, unspecified: Secondary | ICD-10-CM | POA: Diagnosis not present

## 2016-09-30 DIAGNOSIS — M71572 Other bursitis, not elsewhere classified, left ankle and foot: Secondary | ICD-10-CM | POA: Diagnosis not present

## 2016-09-30 DIAGNOSIS — M19072 Primary osteoarthritis, left ankle and foot: Secondary | ICD-10-CM | POA: Diagnosis not present

## 2016-10-09 ENCOUNTER — Ambulatory Visit: Payer: PPO | Admitting: Urology

## 2016-10-09 ENCOUNTER — Encounter: Payer: Self-pay | Admitting: Urology

## 2016-10-09 VITALS — BP 136/71 | HR 75 | Ht 64.0 in | Wt 156.0 lb

## 2016-10-09 DIAGNOSIS — N3946 Mixed incontinence: Secondary | ICD-10-CM

## 2016-10-09 NOTE — Progress Notes (Signed)
10/09/2016 1:36 PM   Lauren [name] 05-03-1941 [id number]  Referring provider: Leone Haven, MD [place], [place] [address]  Chief Complaint  Patient presents with  . Follow-up    mixed incontinence     HPI: The patient is a 76 year old woman who has worsening incontinence over 1 year. She says she does not leak with stress maneuvers or with urgency. She leaks a small amount not associated with awareness during activity levels. She has a little bit of dampness some nights. She wears 3 pads per day moderately wet.  On the patient's last visit she had a small grade 2 cystocele and a double suburethral swelling noted. I felt that she had leakage is not associated awareness and mild enuresis and my index of suspicion is low that she had a diverticulum. I did order an MRI and this was normal I was going to start medication empirically and only or urodynamics if she does not reach her treatment goal  There was no evidence of urethral diverticulum on MRI    Today Frequency stable. The patient still leaks a little bit but she thinks she 75% better. She had dry mouth and constipation I believe from [place] and [employer] [facility]ically noninfected   PMH: Past Medical History:  Diagnosis Date  . Allergic rhinitis   . Chickenpox   . Elevated LDL cholesterol level 11/26/2015  . Incontinence of urine in female 10/05/2015  . Migraines   . Urinary incontinence     Surgical History: Past Surgical History:  Procedure Laterality Date  . CHOLECYSTECTOMY  1995  . TONSILLECTOMY  1962    Home Medications:  Allergies as of 10/09/2016      Reactions   Eggs Or Egg-derived Products    Hydrocodone-guaifenesin Other (See Comments)   Cant sleep    Influenza Vaccines Other (See Comments)   Nausea and vomiting and stomach pain for 10 weeks   Mobic [meloxicam] Swelling   Sulfa Antibiotics Other (See Comments)   Cold sweats, passing out    Codeine Rash   Cold sweats       Medication List       Accurate as of 10/09/16  1:36 PM. Always use your most recent med list.          oxybutynin 10 MG 24 hr tablet Commonly known as:  DITROPAN-XL Take 1 tablet (10 mg total) by mouth daily.   triamcinolone cream 0.1 % Commonly known as:  KENALOG Apply 1-2 times daily to rash       Allergies:  Allergies  Allergen Reactions  . Eggs Or Egg-Derived Products   . Hydrocodone-Guaifenesin Other (See Comments)    Cant sleep   . Influenza Vaccines Other (See Comments)    Nausea and vomiting and stomach pain for 10 weeks  . Mobic [Meloxicam] Swelling  . Sulfa Antibiotics Other (See Comments)    Cold sweats, passing out   . Codeine Rash    Cold sweats     Family History: Family History  Problem Relation Age of Onset  . Arthritis    . Heart disease    . Hypertension    . Hematuria Father     Social History:  reports that she has quit smoking. She has never used smokeless tobacco. She reports that she does not drink alcohol or use drugs.  ROS: UROLOGY Frequent Urination?: No Hard to postpone urination?: No Burning/pain with urination?: No Get up at night to urinate?: No Leakage of urine?:  Yes Urine stream starts and stops?: No Trouble starting stream?: No Do you have to strain to urinate?: No Blood in urine?: No Urinary tract infection?: No Sexually transmitted disease?: No Injury to kidneys or bladder?: No Painful intercourse?: No Weak stream?: No Currently pregnant?: No Vaginal bleeding?: No Last menstrual period?: n  Gastrointestinal Nausea?: No Vomiting?: No Indigestion/heartburn?: No Diarrhea?: No Constipation?: No  Constitutional Fever: No Night sweats?: No Weight loss?: No Fatigue?: No  Skin Skin rash/lesions?: No Itching?: No  Eyes Blurred vision?: No Double vision?: No  Ears/Nose/Throat Sore throat?: No Sinus problems?: No  Hematologic/Lymphatic Swollen glands?: No Easy bruising?: No  Cardiovascular Leg  swelling?: No Chest pain?: No  Respiratory Cough?: No Shortness of breath?: No  Endocrine Excessive thirst?: No  Musculoskeletal Back pain?: No Joint pain?: No  Neurological Headaches?: No Dizziness?: No  Psychologic Depression?: No Anxiety?: No  Physical Exam: BP 136/71   Pulse 75   Ht 5\' 4"  (1.626 m)   Wt 70.8 kg (156 lb)   BMI 26.78 kg/m     Laboratory Data: No results found for: WBC, HGB, HCT, MCV, PLT  Lab Results  Component Value Date   CREATININE 0.92 05/24/2016    No results found for: PSA  No results found for: TESTOSTERONE  No results found for: HGBA1C  Urinalysis    Component Value Date/Time   APPEARANCEUR Clear 11/03/2015 1048   GLUCOSEU Negative 11/03/2015 1048   BILIRUBINUR Negative 11/03/2015 1048   PROTEINUR Negative 11/03/2015 1048   NITRITE Negative 11/03/2015 1048   LEUKOCYTESUR Negative 11/03/2015 1048    Pertinent Imaging: none  Assessment & Plan:  The patient is a partial responder oxybutynin. I gave her 5 weeks of the beta 3 agonists and I'll see her back in a month. Urodynamics as an option. We can always go back to oxybutynin  There are no diagnoses linked to this encounter.  No Follow-up on file.  Reece Packer, MD  [facility] [address] [place], [place] [address] [phone]

## 2016-10-14 ENCOUNTER — Ambulatory Visit (INDEPENDENT_AMBULATORY_CARE_PROVIDER_SITE_OTHER): Payer: PPO

## 2016-10-14 DIAGNOSIS — Z23 Encounter for immunization: Secondary | ICD-10-CM | POA: Diagnosis not present

## 2016-10-14 NOTE — Progress Notes (Signed)
Patient comes in for Pneumoccal injection . Injected left deltoid.  Patient tolerated injection well.

## 2016-10-17 NOTE — Progress Notes (Signed)
I have reviewed the above note and agree.  Johnedward Brodrick, M.D.  

## 2016-11-07 ENCOUNTER — Ambulatory Visit: Payer: PPO

## 2016-11-07 ENCOUNTER — Telehealth: Payer: Self-pay | Admitting: Family Medicine

## 2016-11-07 NOTE — Telephone Encounter (Signed)
Pt received a letter from the breast center about getting a Mammogram, it has been since 12/08/15. Does she need to have a mammogram yearly? Please call her at [phone].

## 2016-11-07 NOTE — Telephone Encounter (Signed)
Please advise 

## 2016-11-08 ENCOUNTER — Ambulatory Visit: Payer: PPO

## 2016-11-08 NOTE — Telephone Encounter (Signed)
Patient advised of below and verbalized an understanding , she will get a mammogram in 1 year.

## 2016-11-08 NOTE — Telephone Encounter (Signed)
We typically do them yearly, though if she has no history of breast cancer and has no family history of breast cancer it would be reasonable to do them every 2 years if she did not want to do them yearly.

## 2016-11-11 ENCOUNTER — Ambulatory Visit (INDEPENDENT_AMBULATORY_CARE_PROVIDER_SITE_OTHER): Payer: PPO | Admitting: Urology

## 2016-11-11 VITALS — BP 154/77 | HR 84 | Ht 65.0 in | Wt 155.1 lb

## 2016-11-11 DIAGNOSIS — N3946 Mixed incontinence: Secondary | ICD-10-CM

## 2016-11-11 MED ORDER — OXYBUTYNIN CHLORIDE ER 10 MG PO TB24: 10.0000 mg | ORAL_TABLET | Freq: Every day | ORAL | 11 refills | Status: DC

## 2016-11-11 NOTE — Progress Notes (Signed)
11/11/2016 2:12 PM   Lauren [name] 05-Dec-1940 [id number]  Referring provider: Leone Haven, MD [place], [place] [address]  Chief Complaint  Patient presents with  . Follow-up    mixed incontinence     HPI: The patient is a 76 year old woman who has worsening incontinence over 1 year. She says she does not leak with stress maneuvers or with urgency. She leaks a small amount not associated with awareness during activity levels. She has a little bit of dampness some nights. She wears 3 pads per day moderately wet.  On the patient's last visit she had a small grade 2 cystocele and a double suburethral swelling noted. I felt that she had leakage is not associated awareness and mild enuresis and my index of suspicion is low that she had a diverticulum. I did order an MRI and this was normal I was going to start medication empirically and only or urodynamics if she does not reach her treatment goal. MRI was normal  he patient still leaks a little bit but she thinks she 75% better. She had dry mouth and constipation I believe from [place] and [employer]. Partial responder to oxybutynin  Today The beta 3 agonist did not help. Frequency and incontinence are stable and she clinically is noninfected     PMH: Past Medical History:  Diagnosis Date  . Allergic rhinitis   . Chickenpox   . Elevated LDL cholesterol level 11/26/2015  . Incontinence of urine in female 10/05/2015  . Migraines   . Urinary incontinence     Surgical History: Past Surgical History:  Procedure Laterality Date  . CHOLECYSTECTOMY  1995  . TONSILLECTOMY  1962    Home Medications:  Allergies as of 11/11/2016      Reactions   Eggs Or Egg-derived Products    Hydrocodone-guaifenesin Other (See Comments)   Cant sleep    Influenza Vaccines Other (See Comments)   Nausea and vomiting and stomach pain for 10 weeks   Mobic [meloxicam] Swelling   Sulfa Antibiotics Other (See Comments)   Cold  sweats, passing out    Codeine Rash   Cold sweats       Medication List       Accurate as of 11/11/16  2:12 PM. Always use your most recent med list.          oxybutynin 10 MG 24 hr tablet Commonly known as:  DITROPAN-XL Take 1 tablet (10 mg total) by mouth daily.   triamcinolone cream 0.1 % Commonly known as:  KENALOG Apply 1-2 times daily to rash       Allergies:  Allergies  Allergen Reactions  . Eggs Or Egg-Derived Products   . Hydrocodone-Guaifenesin Other (See Comments)    Cant sleep   . Influenza Vaccines Other (See Comments)    Nausea and vomiting and stomach pain for 10 weeks  . Mobic [Meloxicam] Swelling  . Sulfa Antibiotics Other (See Comments)    Cold sweats, passing out   . Codeine Rash    Cold sweats     Family History: Family History  Problem Relation Age of Onset  . Arthritis    . Heart disease    . Hypertension    . Hematuria Father     Social History:  reports that she has quit smoking. She has never used smokeless tobacco. She reports that she does not drink alcohol or use drugs.  ROS: UROLOGY Frequent Urination?: No Hard to postpone urination?: No Burning/pain with  urination?: No Get up at night to urinate?: No Leakage of urine?: No Urine stream starts and stops?: No Trouble starting stream?: No Do you have to strain to urinate?: No Blood in urine?: No Urinary tract infection?: No Sexually transmitted disease?: No Injury to kidneys or bladder?: No Painful intercourse?: No Weak stream?: No Currently pregnant?: No Vaginal bleeding?: No Last menstrual period?: n  Gastrointestinal Nausea?: No Vomiting?: No Indigestion/heartburn?: No Diarrhea?: No Constipation?: No  Constitutional Fever: No Night sweats?: No Weight loss?: No Fatigue?: No  Skin Skin rash/lesions?: No Itching?: No  Eyes Blurred vision?: No Double vision?: No  Ears/Nose/Throat Sore throat?: No Sinus problems?: No  Hematologic/Lymphatic Swollen  glands?: No Easy bruising?: No  Cardiovascular Leg swelling?: No Chest pain?: No  Respiratory Cough?: No Shortness of breath?: No  Endocrine Excessive thirst?: No  Musculoskeletal Back pain?: No Joint pain?: No  Neurological Headaches?: No Dizziness?: No  Psychologic Depression?: No Anxiety?: No  Physical Exam: BP (!) 154/77   Pulse 84   Ht 5\' 5"  (1.651 m)   Wt 70.4 kg (155 lb 1.6 oz)   BMI 25.81 kg/m    Laboratory Data: No results found for: WBC, HGB, HCT, MCV, PLT  Lab Results  Component Value Date   CREATININE 0.92 05/24/2016    No results found for: PSA  No results found for: TESTOSTERONE  No results found for: HGBA1C  Urinalysis    Component Value Date/Time   APPEARANCEUR Clear 11/03/2015 1048   GLUCOSEU Negative 11/03/2015 1048   BILIRUBINUR Negative 11/03/2015 1048   PROTEINUR Negative 11/03/2015 1048   NITRITE Negative 11/03/2015 1048   LEUKOCYTESUR Negative 11/03/2015 1048    Pertinent Imaging: none  Assessment & Plan: We are going to represcribed the oxybutynin 10 mg once daily and I will see her in 1 year. We are not to pursue more aggressive treatments  1. Mixed stress and urge urinary incontinence 2. Urinary frequency   No Follow-up on file.  Reece Packer, MD  [facility] [address], [place], [place] [address] [phone]

## 2016-11-25 ENCOUNTER — Ambulatory Visit (INDEPENDENT_AMBULATORY_CARE_PROVIDER_SITE_OTHER): Payer: PPO

## 2016-11-25 VITALS — BP 138/78 | HR 74 | Temp 97.0°F | Resp 14 | Ht 64.0 in | Wt 155.1 lb

## 2016-11-25 DIAGNOSIS — Z0000 Encounter for general adult medical examination without abnormal findings: Secondary | ICD-10-CM | POA: Diagnosis not present

## 2016-11-25 NOTE — Progress Notes (Signed)
Subjective:   Lauren [name] is a 76 y.o. female who presents for Medicare Annual (Subsequent) preventive examination.  Review of Systems:  No ROS.  Medicare Wellness Visit.  Cardiac Risk Factors include: advanced age (>92men, >20 women)     Objective:     Vitals: BP 138/78 (BP Location: Left Arm, Patient Position: Sitting, Cuff Size: Normal)   Pulse 74   Temp 97 F (36.1 C) (Oral)   Resp 14   Ht 5\' 4"  (1.626 m)   Wt 155 lb 1.9 oz (70.4 kg)   SpO2 97%   BMI 26.63 kg/m   Body mass index is 26.63 kg/m.   Tobacco History  Smoking Status  . Former Smoker  Smokeless Tobacco  . Never Used     Counseling given: Not Answered   Past Medical History:  Diagnosis Date  . Allergic rhinitis   . Chickenpox   . Elevated LDL cholesterol level 11/26/2015  . Incontinence of urine in female 10/05/2015  . Migraines   . Urinary incontinence    Past Surgical History:  Procedure Laterality Date  . CHOLECYSTECTOMY  1995  . TONSILLECTOMY  1962   Family History  Problem Relation Age of Onset  . Arthritis    . Heart disease    . Hypertension    . Hematuria Father    History  Sexual Activity  . Sexual activity: No    Outpatient Encounter Prescriptions as of 11/25/2016  Medication Sig  . oxybutynin (DITROPAN-XL) 10 MG 24 hr tablet Take 1 tablet (10 mg total) by mouth daily.  Marland Kitchen triamcinolone cream (KENALOG) 0.1 % Apply 1-2 times daily to rash   Facility-Administered Encounter Medications as of 11/25/2016  Medication  . betamethasone acetate-betamethasone sodium phosphate (CELESTONE) injection 3 mg    Activities of Daily Living In your present state of health, do you have any difficulty performing the following activities: 11/25/2016  Hearing? N  Vision? N  Difficulty concentrating or making decisions? N  Walking or climbing stairs? Y  Dressing or bathing? N  Doing errands, shopping? N  Preparing Food and eating ? N  Using the Toilet? N  In the past six months, have you  accidently leaked urine? Y  Do you have problems with loss of bowel control? N  Managing your Medications? N  Managing your Finances? N  Housekeeping or managing your Housekeeping? N  Some recent data might be hidden    Patient Care Team: Leone Haven, MD as PCP - General (Family Medicine)    Assessment:    This is a routine wellness examination for Lauren [name]. The goal of the wellness visit is to assist the patient how to close the gaps in care and create a preventative care plan for the patient.   Osteoporosis reviewed.  Medications reviewed; taking without issues or barriers.  Safety issues reviewed; smoke detectors in the home. Firearms locked up in the home. Wears seatbelts when driving or riding with others. Patient does wear sunscreen or protective clothing when in direct sunlight. No violence in the home.  Patient is alert, normal appearance, oriented to person/place/and time. Correctly identified the president of the [place], recall of 2/3 words, and performing simple calculations.  Patient displays appropriate judgement and can read correct time from watch face.  No new identified risk were noted.  No failures at ADL's or IADL's.   BMI- discussed the importance of a healthy diet, water intake and exercise. Educational material provided.   24 hour diet recall: Breakfast:  1 egg  Lunch: Deli sandwich Dinner: Ham, green vegetable, eggs Daily fluid intake: 1 cups of caffeine, 8 cups of water  HTN- followed by PCP.  Dental- Wears dentures.  Eye- Visual acuity not assessed per patient preference since they have regular follow up with the ophthalmologist.  Wears corrective lenses.  Sleep patterns- Sleeps 8 hours at night.  Wakes feeling rested.  Patient Concerns: None at this time. Follow up with PCP as needed.  Exercise Activities and Dietary recommendations Current Exercise Habits: Home exercise routine, Type of exercise: walking (Yard work), Time (Minutes): 20,  Frequency (Times/Week): 4, Weekly Exercise (Minutes/Week): 80, Intensity: Moderate  Goals    . Healthy Lifestyle          Maintain exercise regimen of walking Stay hydrated!  Drink plenty of water. Low carb foods.  Lean meats, fruits and vegetables.      Fall Risk Fall Risk  11/25/2016 08/30/2016 11/08/2015  Falls in the past year? No No No   Depression Screen PHQ 2/9 Scores 11/25/2016 08/30/2016 11/08/2015  PHQ - 2 Score 0 0 0     Cognitive Function MMSE - Mini Mental State Exam 11/25/2016 11/08/2015  Orientation to time 5 5  Orientation to Place 5 5  Registration 3 3  Attention/ Calculation 5 5  Recall 3 3  Language- name 2 objects 2 2  Language- repeat 1 1  Language- follow 3 step command 3 3  Language- read & follow direction 1 1  Write a sentence 1 1  Copy design 1 1  Total score 30 30        Immunization History  Administered Date(s) Administered  . Pneumococcal Polysaccharide-23 10/05/2015, 10/14/2016   Screening Tests Health Maintenance  Topic Date Due  . COLONOSCOPY  12/07/1992  . INFLUENZA VACCINE  03/26/2017  . PNA vac Low Risk Adult (2 of 2 - PCV13) 10/14/2017  . MAMMOGRAM  12/07/2017  . TETANUS/TDAP  08/31/2019  . DEXA SCAN  Completed      Plan:    End of life planning; Advance aging; Advanced directives discussed. Copy of current HCPOA/Living Will requested.    Medicare Attestation I have personally reviewed: The patient's medical and social history Their use of alcohol, tobacco or illicit drugs Their current medications and supplements The patient's functional ability including ADLs,fall risks, home safety risks, cognitive, and hearing and visual impairment Diet and physical activities Evidence for depression   The patient's weight, height, BMI, and visual acuity have been recorded in the chart.  I have made referrals and provided education to the patient based on review of the above and I have provided the patient with a written personalized care  plan for preventive services.    During the course of the visit the patient was educated and counseled about the following appropriate screening and preventive services:   Vaccines to include Pneumoccal, Influenza, Hepatitis B, Td, Zostavax, HCV  Colorectal cancer screening-Cologuard received, follow as directed  Bone density screening-UTD  Glaucoma screening-eye exam encouraged  Mammography-UTD  Nutrition counseling   Patient Instructions (the written plan) was given to the patient.   Varney Biles, LPN  08/31/1094

## 2016-11-25 NOTE — Patient Instructions (Addendum)
  Lauren [name] , Thank you for taking time to come for your Medicare Wellness Visit. I appreciate your ongoing commitment to your health goals. Please review the following plan we discussed and let me know if I can assist you in the future.   Follow up with Dr. Caryl Bis as needed.    Bring a copy of your [place] and/or Living Will to be scanned into chart.  Have a great day!  These are the goals we discussed: Goals    . Healthy Lifestyle          Maintain exercise regimen of walking Stay hydrated!  Drink plenty of water. Low carb foods.  Lean meats, fruits and vegetables.       This is a list of the screening recommended for you and due dates:  Health Maintenance  Topic Date Due  . Colon Cancer Screening  12/07/1992  . Flu Shot  03/26/2017  . Pneumonia vaccines (2 of 2 - PCV13) 10/14/2017  . Mammogram  12/07/2017  . Tetanus Vaccine  08/31/2019  . DEXA scan (bone density measurement)  Completed

## 2016-12-12 NOTE — Progress Notes (Signed)
I have reviewed the above note and agree.  Jahsir Rama, M.D.  

## 2017-03-04 ENCOUNTER — Encounter: Payer: Self-pay | Admitting: Family Medicine

## 2017-03-04 ENCOUNTER — Ambulatory Visit (INDEPENDENT_AMBULATORY_CARE_PROVIDER_SITE_OTHER): Payer: PPO | Admitting: Family Medicine

## 2017-03-04 VITALS — BP 132/70 | HR 81 | Temp 98.4°F | Wt 154.0 lb

## 2017-03-04 DIAGNOSIS — M79672 Pain in left foot: Secondary | ICD-10-CM | POA: Diagnosis not present

## 2017-03-04 DIAGNOSIS — E785 Hyperlipidemia, unspecified: Secondary | ICD-10-CM

## 2017-03-04 DIAGNOSIS — E7800 Pure hypercholesterolemia, unspecified: Secondary | ICD-10-CM

## 2017-03-04 DIAGNOSIS — R32 Unspecified urinary incontinence: Secondary | ICD-10-CM

## 2017-03-04 DIAGNOSIS — E663 Overweight: Secondary | ICD-10-CM | POA: Diagnosis not present

## 2017-03-04 DIAGNOSIS — H938X2 Other specified disorders of left ear: Secondary | ICD-10-CM

## 2017-03-04 NOTE — Patient Instructions (Signed)
Nice to see you. We will have you return for fasting lab work. If your left ear continues to bother you you could try over-the-counter Flonase.

## 2017-03-04 NOTE — Assessment & Plan Note (Signed)
Suspect eustachian tube dysfunction particularly given prior congestion. Advised to monitor for the next week or so. If not improving she can try Flonase. If not improving with that she'll let us know.

## 2017-03-04 NOTE — Assessment & Plan Note (Signed)
Improved after seeing podiatry. She'll monitor for recurrence.

## 2017-03-04 NOTE — Assessment & Plan Note (Addendum)
Quite a bit improved. She'll continue oxybutynin. If dry mouth becomes troublesome she'll let us know.

## 2017-03-04 NOTE — Assessment & Plan Note (Signed)
She'll return for fasting lab work. Encouraged diet and exercise.

## 2017-03-04 NOTE — Progress Notes (Signed)
  Tommi Rumps, MD Phone: [phone]  Lauren [name] is a 76 y.o. female who presents today for follow-up.  Hyperlipidemia: No chest pain or claudication. Diet consists mostly of chicken and vegetables. Not really eating any fried or fatty foods. She's been mowing the yard for exercise.  Burge incontinence: Oxybutynin is working quite well. She notes this is 98% better. She's had a little bit of dry mouth on the oxybutynin. She follows up with urology in one year.  She saw podiatry and had the callus trimmed off. She also got shots in her feet for inflammation and swelling related plantar fasciitis. Notes the pain in her feet is significantly better. Rarely gets discomfort now.  She notes her left ear has been itchy and stopped up. Had some congestion about a week ago. She has not tried anything for this.  PMH: Former smoker   ROS see history of present illness  Objective  Physical Exam Vitals:   03/04/17 0758  BP: 132/70  Pulse: 81  Temp: 98.4 F (36.9 C)    BP Readings from Last 3 Encounters:  03/04/17 132/70  11/25/16 138/78  11/11/16 (!) 154/77   Wt Readings from Last 3 Encounters:  03/04/17 154 lb (69.9 kg)  11/25/16 155 lb 1.9 oz (70.4 kg)  11/11/16 155 lb 1.6 oz (70.4 kg)    Physical Exam  Constitutional: No distress.  HENT:  Head: Normocephalic and atraumatic.  Cardiovascular: Normal rate, regular rhythm and normal heart sounds.   Pulmonary/Chest: Effort normal and breath sounds normal.  Abdominal: Soft. Bowel sounds are normal. She exhibits no distension. There is no tenderness. There is no rebound and no guarding.  Musculoskeletal: She exhibits no edema.  Neurological: She is alert. Gait normal.  Skin: Skin is warm and dry. She is not diaphoretic.   normal TMs bilaterally, normal ear canal bilaterally, slight decreased hearing on left to finger rub   Assessment/Plan: Please see individual problem list.  Incontinence of urine in female Quite a bit  improved. She'll continue oxybutynin. If dry mouth becomes troublesome she'll let us know.  Elevated LDL cholesterol level She'll return for fasting lab work. Encouraged diet and exercise.  Left foot pain Improved after seeing podiatry. She'll monitor for recurrence.  Ear fullness, left Suspect eustachian tube dysfunction particularly given prior congestion. Advised to monitor for the next week or so. If not improving she can try Flonase. If not improving with that she'll let us know.   Orders Placed This Encounter  Procedures  . Lipid panel    Standing Status:   Future    Standing Expiration Date:   03/04/2018  . Hemoglobin A1c    Standing Status:   Future    Standing Expiration Date:   03/04/2018  . Comp Met (CMET)    Standing Status:   Future    Standing Expiration Date:   03/04/2018   Tommi Rumps, MD [place]

## 2017-03-26 ENCOUNTER — Encounter: Payer: Self-pay | Admitting: Family Medicine

## 2017-03-26 DIAGNOSIS — H40023 Open angle with borderline findings, high risk, bilateral: Secondary | ICD-10-CM | POA: Diagnosis not present

## 2017-03-26 DIAGNOSIS — H2513 Age-related nuclear cataract, bilateral: Secondary | ICD-10-CM | POA: Diagnosis not present

## 2017-04-08 DIAGNOSIS — H401131 Primary open-angle glaucoma, bilateral, mild stage: Secondary | ICD-10-CM | POA: Diagnosis not present

## 2017-05-06 DIAGNOSIS — H401131 Primary open-angle glaucoma, bilateral, mild stage: Secondary | ICD-10-CM | POA: Diagnosis not present

## 2017-05-30 ENCOUNTER — Encounter: Payer: Self-pay | Admitting: Family Medicine

## 2017-06-06 DIAGNOSIS — H25043 Posterior subcapsular polar age-related cataract, bilateral: Secondary | ICD-10-CM | POA: Diagnosis not present

## 2017-06-06 DIAGNOSIS — H2511 Age-related nuclear cataract, right eye: Secondary | ICD-10-CM | POA: Diagnosis not present

## 2017-06-06 DIAGNOSIS — H02839 Dermatochalasis of unspecified eye, unspecified eyelid: Secondary | ICD-10-CM | POA: Diagnosis not present

## 2017-06-06 DIAGNOSIS — H25013 Cortical age-related cataract, bilateral: Secondary | ICD-10-CM | POA: Diagnosis not present

## 2017-06-06 DIAGNOSIS — H2513 Age-related nuclear cataract, bilateral: Secondary | ICD-10-CM | POA: Diagnosis not present

## 2017-06-18 ENCOUNTER — Telehealth: Payer: Self-pay | Admitting: Family Medicine

## 2017-06-18 NOTE — Telephone Encounter (Signed)
Patient called stating per her Optimologist she will need to stop taking the Oxybutinin. Patient haas decided to stop the medication and does not want Myrbetriq because of the cost. She said she will try to not have any medication at this time.

## 2017-06-30 ENCOUNTER — Telehealth: Payer: Self-pay

## 2017-06-30 NOTE — Telephone Encounter (Signed)
Copied from [facility] #[id number]. Topic: General - Other >> Jun 30, 2017  4:54 PM Patrice Paradise wrote: Reason for CRM: Pt daughter called [facility] [phone] and would like for Dr. Caryl Bis assistant to give her a call. Daughter stated that her mother has an appt. July 02, 2017 @ 3 pm. Pt daughter wants to give the assistant some back on her mother before she get to the office. Daughter stated her mother shows out a lot.

## 2017-07-01 NOTE — Telephone Encounter (Signed)
Noted-  Will discuss tomorrow

## 2017-07-01 NOTE — Telephone Encounter (Signed)
FYI: Patients daughter states she is bringing her mother to the appointment tomorrow and patient is not happy about coming in. Patient has memory issues and is in denial per patients daughter. She states she calls her friends several times a day and has the same conversation several times with them. She would like to get some help for her mother, patient thinks it is due to a medication side effect. The daughter states the patient has been known to be more ballistic recently.Patients memory has been decreasing for about a year per daughter.

## 2017-07-02 ENCOUNTER — Telehealth: Payer: Self-pay | Admitting: Radiology

## 2017-07-02 ENCOUNTER — Ambulatory Visit: Payer: PPO | Admitting: Family Medicine

## 2017-07-02 ENCOUNTER — Encounter: Payer: Self-pay | Admitting: Family Medicine

## 2017-07-02 VITALS — BP 158/80 | HR 94 | Temp 97.8°F | Wt 155.2 lb

## 2017-07-02 DIAGNOSIS — R413 Other amnesia: Secondary | ICD-10-CM

## 2017-07-02 DIAGNOSIS — R030 Elevated blood-pressure reading, without diagnosis of hypertension: Secondary | ICD-10-CM

## 2017-07-02 DIAGNOSIS — I10 Essential (primary) hypertension: Secondary | ICD-10-CM | POA: Insufficient documentation

## 2017-07-02 NOTE — Progress Notes (Signed)
  Tommi Rumps, MD Phone: [phone]  Lauren [name] is a 76 y.o. female who presents today for follow-up.  Patient presents with her family today to discuss memory issues.  The patient notes this goes back about 9 months to when she was placed on oxybutynin.  Notes it worked quite well for her bladder issues though made her feel foggy.  She is come off of this about 2 weeks ago.  She notes she is not getting lost.  No hallucinations.  No ADL or I she does have a family history of Alzheimer's in her mother.  Her family presents with her and states that she has been repeating herself multiple times within the same day.  Notes it has been going on longer than 9 months.  MMSE - Mini Mental State Exam 07/02/2017 11/25/2016 11/08/2015  Orientation to time 5 5 5   Orientation to Place 5 5 5   Registration 3 3 3   Attention/ Calculation 3 5 5   Recall 3 3 3   Language- name 2 objects 2 2 2   Language- repeat 1 1 1   Language- follow 3 step command 3 3 3   Language- read & follow direction 1 1 1   Write a sentence 1 1 1   Copy design 1 1 1   Total score 28 30 30    BP elevated today.  Suspect related to stress of coming in to the office to discuss memory issues that she does not think she has.  History of elevated blood pressure  Social History   Tobacco Use  Smoking Status Former Smoker  Smokeless Tobacco Never Used     ROS see history of present illness  Objective  Physical Exam Vitals:   07/02/17 1445  BP: (!) 158/80  Pulse: 94  Temp: 97.8 F (36.6 C)  SpO2: 98%    BP Readings from Last 3 Encounters:  07/02/17 (!) 158/80  03/04/17 132/70  11/25/16 138/78   Wt Readings from Last 3 Encounters:  07/02/17 155 lb 3.2 oz (70.4 kg)  03/04/17 154 lb (69.9 kg)  11/25/16 155 lb 1.9 oz (70.4 kg)    Physical Exam  Constitutional: No distress.  Cardiovascular: Normal rate, regular rhythm and normal heart sounds.  Pulmonary/Chest: Effort normal and breath sounds normal.  Neurological:  She is alert. Gait normal.  Skin: Skin is warm and dry. She is not diaphoretic.     Assessment/Plan: Please see individual problem list.  Memory deficit Patient with issues repeating herself.  MMSE in the normal range.  No ADL or IADL issues.  Potentially could be medication related.  She is already off of the oxybutynin.  Will check B12 and TSH.  She will continue to monitor.  Elevated BP without diagnosis of hypertension Elevated on single occasion today.  Plan for recheck in 1-2 weeks.   Lauren [name] was seen today for follow-up and memory loss.  Diagnoses and all orders for this visit:  Memory deficit -     B12 -     TSH  Elevated BP without diagnosis of hypertension    Orders Placed This Encounter  Procedures  . B12  . TSH    Tommi Rumps, MD [place]

## 2017-07-02 NOTE — Assessment & Plan Note (Signed)
Elevated on single occasion today.  Plan for recheck in 1-2 weeks.

## 2017-07-02 NOTE — Telephone Encounter (Signed)
noted 

## 2017-07-02 NOTE — Telephone Encounter (Signed)
Pt family asked if when results come back to give results to Aurelio Jew. Family member states there are some personal questions they would like to ask Dr. Caryl Bis.   Aurelio Jew [phone]

## 2017-07-02 NOTE — Patient Instructions (Signed)
Nice to see you. We will have you come back in 1-2 weeks for BP check. Please monitor your memory and if it worsens please let us know.

## 2017-07-02 NOTE — Assessment & Plan Note (Signed)
Patient with issues repeating herself.  MMSE in the normal range.  No ADL or IADL issues.  Potentially could be medication related.  She is already off of the oxybutynin.  Will check B12 and TSH.  She will continue to monitor.

## 2017-07-03 LAB — VITAMIN B12: VITAMIN B 12: 243 pg/mL (ref 211–911)

## 2017-07-03 LAB — TSH: TSH: 1.05 u[IU]/mL (ref 0.35–4.50)

## 2017-07-03 NOTE — Telephone Encounter (Signed)
Per DPR it appears it would be okay to give them the results.  Please give them the results per the result note.  Please see what questions they have.  Thanks.

## 2017-07-09 ENCOUNTER — Telehealth: Payer: Self-pay | Admitting: Family Medicine

## 2017-07-09 ENCOUNTER — Ambulatory Visit: Payer: PPO

## 2017-07-09 ENCOUNTER — Encounter: Payer: Self-pay | Admitting: Family

## 2017-07-09 ENCOUNTER — Ambulatory Visit (INDEPENDENT_AMBULATORY_CARE_PROVIDER_SITE_OTHER): Payer: PPO | Admitting: Family

## 2017-07-09 VITALS — BP 150/80 | HR 83 | Ht 64.0 in | Wt 158.4 lb

## 2017-07-09 DIAGNOSIS — I10 Essential (primary) hypertension: Secondary | ICD-10-CM

## 2017-07-09 LAB — COMPREHENSIVE METABOLIC PANEL
ALT: 18 U/L (ref 0–35)
AST: 19 U/L (ref 0–37)
Albumin: 4 g/dL (ref 3.5–5.2)
Alkaline Phosphatase: 50 U/L (ref 39–117)
BILIRUBIN TOTAL: 0.6 mg/dL (ref 0.2–1.2)
BUN: 16 mg/dL (ref 6–23)
CO2: 32 mEq/L (ref 19–32)
CREATININE: 0.79 mg/dL (ref 0.40–1.20)
Calcium: 9.4 mg/dL (ref 8.4–10.5)
Chloride: 106 mEq/L (ref 96–112)
GFR: 75.49 mL/min (ref >60.00)
GLUCOSE: 100 mg/dL — AB (ref 70–99)
POTASSIUM: 4.2 meq/L (ref 3.5–5.1)
SODIUM: 142 meq/L (ref 135–145)
TOTAL PROTEIN: 6.6 g/dL (ref 6.0–8.3)

## 2017-07-09 MED ORDER — AMLODIPINE BESYLATE 2.5 MG PO TABS: 2.5000 mg | ORAL_TABLET | Freq: Every day | ORAL | 3 refills | Status: DC

## 2017-07-09 NOTE — Assessment & Plan Note (Signed)
Second elevated occasion. Markedly so. No signs or symptoms of HTN urgency or emergency at this time. Reassured by normal neurologic exam. Blood pressure discrepancy noted and referral to vascular placed. Will start amlodipine today and patient will let us know if BP responds. Pending cmp. Return precautions given.

## 2017-07-09 NOTE — Progress Notes (Signed)
Subjective:    Patient ID: Lauren [name], female    DOB: [birth year], 76 y.o.   MRN: [id number]  CC: Lauren [name] is a 76 y.o. female who presents today for an acute visit.    HPI: CC: elevated blood pressure today. Doesn't check at home.   Planing for glaucoma surgery in 2 days.   Feels well. Denies exertional chest pain or pressure, numbness or tingling radiating to left arm or jaw, palpitations, dizziness, frequent headaches, changes in vision, or shortness of breath.  No right arm pain, numbness, tingling.   Notes had been on oxybutnin for past 9 months, stopped medication and doesn't 'feel as foggy'. States she is not repeating herself.   No prior h/o htn    former smoker  HISTORY:  Past Medical History:  Diagnosis Date  . Allergic rhinitis   . Chickenpox   . Elevated LDL cholesterol level 11/26/2015  . Incontinence of urine in female 10/05/2015  . Migraines   . Urinary incontinence    Past Surgical History:  Procedure Laterality Date  . CHOLECYSTECTOMY  1995  . TONSILLECTOMY  1962   Family History  Problem Relation Age of Onset  . Arthritis Unknown   . Heart disease Unknown   . Hypertension Unknown   . Hematuria Father     Allergies: Eggs or egg-derived products; Hydrocodone-guaifenesin; Influenza vaccines; Mobic [meloxicam]; Oxybutynin; Sulfa antibiotics; and Codeine Current Outpatient Medications on File Prior to Visit  Medication Sig Dispense Refill  . latanoprost (XALATAN) 0.005 % ophthalmic solution INT 1 GTT INTO OU QHS  4  . triamcinolone cream (KENALOG) 0.1 % Apply 1-2 times daily to rash 80 g 0   Current Facility-Administered Medications on File Prior to Visit  Medication Dose Route Frequency Provider Last Rate Last Dose  . betamethasone acetate-betamethasone sodium phosphate (CELESTONE) injection 3 mg  3 mg Intramuscular Once Edrick Kins, DPM        Social History   Tobacco Use  . Smoking status: Former Research scientist (life sciences)  . Smokeless tobacco: Never Used   Substance Use Topics  . Alcohol use: No    Alcohol/week: 0.0 oz  . Drug use: No    Review of Systems  Constitutional: Negative for chills and fever.  Eyes: Negative for visual disturbance (no acute changes).  Respiratory: Negative for cough.   Cardiovascular: Negative for chest pain and palpitations.  Gastrointestinal: Negative for nausea and vomiting.  Neurological: Negative for headaches.      Objective:    BP (!) 191/79   Pulse 83   Ht 5\' 4"  (1.626 m)   Wt 158 lb 6.7 oz (71.9 kg)   SpO2 98%   BMI 27.19 kg/m   BP Readings from Last 3 Encounters:  07/09/17 (!) 191/79  07/02/17 (!) 158/80  03/04/17 132/70   Wt Readings from Last 3 Encounters:  07/09/17 158 lb 6.7 oz (71.9 kg)  07/02/17 155 lb 3.2 oz (70.4 kg)  03/04/17 154 lb (69.9 kg)    Physical Exam  Constitutional: She appears well-developed and well-nourished.  Eyes: Conjunctivae are normal.  Cardiovascular: Normal rate, regular rhythm, normal heart sounds and normal pulses.  Pulmonary/Chest: Effort normal and breath sounds normal. She has no wheezes. She has no rhonchi. She has no rales.  Neurological: She is alert.  Skin: Skin is warm and dry.  Psychiatric: She has a normal mood and affect. Her speech is normal and behavior is normal. Thought content normal.  Vitals reviewed.  Assessment & Plan:   Problem List Items Addressed This Visit      Cardiovascular and Mediastinum   HTN (hypertension) - Primary    Second elevated occasion. Markedly so. No signs or symptoms of HTN urgency or emergency at this time. Reassured by normal neurologic exam. Blood pressure discrepancy noted and referral to vascular placed. Will start amlodipine today and patient will let us know if BP responds. Pending cmp. Return precautions given.       Relevant Medications   amLODipine (NORVASC) 2.5 MG tablet   Other Relevant Orders   Comprehensive metabolic panel   Ambulatory referral to Vascular Surgery        I am  having Natalyah S. Vandall maintain her triamcinolone cream and latanoprost. We will continue to administer betamethasone acetate-betamethasone sodium phosphate.   No orders of the defined types were placed in this encounter.   Return precautions given.   Risks, benefits, and alternatives of the medications and treatment plan prescribed today were discussed, and patient expressed understanding.   Education regarding symptom management and diagnosis given to patient on AVS.  Continue to follow with Leone Haven, MD for routine health maintenance.   Kela Millin and I agreed with plan.   Mable Paris, FNP

## 2017-07-09 NOTE — Telephone Encounter (Signed)
Please advise 

## 2017-07-09 NOTE — Telephone Encounter (Signed)
Pt would like a [facility] facility doctor for her referral to the [facility]. Thank you!

## 2017-07-09 NOTE — Patient Instructions (Addendum)
Start amlodipine today  Monitor blood pressure,  Goal is less than 130/80; if persistently higher, please make sooner follow up appointment so we can recheck you blood pressure and manage medications  Referral to vascular due to differnce of blood pressure between both arms.   Managing Your Hypertension Hypertension is commonly called high blood pressure. This is when the force of your blood pressing against the walls of your arteries is too strong. Arteries are blood vessels that carry blood from your heart throughout your body. Hypertension forces the heart to work harder to pump blood, and may cause the arteries to become narrow or stiff. Having untreated or uncontrolled hypertension can cause heart attack, stroke, kidney disease, and other problems. What are blood pressure readings? A blood pressure reading consists of a higher number over a lower number. Ideally, your blood pressure should be below 120/80. The first ("top") number is called the systolic pressure. It is a measure of the pressure in your arteries as your heart beats. The second ("bottom") number is called the diastolic pressure. It is a measure of the pressure in your arteries as the heart relaxes. What does my blood pressure reading mean? Blood pressure is classified into four stages. Based on your blood pressure reading, your health care provider may use the following stages to determine what type of treatment you need, if any. Systolic pressure and diastolic pressure are measured in a unit called mm Hg. Normal  Systolic pressure: below 329.  Diastolic pressure: below 80. Elevated  Systolic pressure: 518-841.  Diastolic pressure: below 80. Hypertension stage 1  Systolic pressure: 660-630.  Diastolic pressure: 16-01. Hypertension stage 2  Systolic pressure: 093 or above.  Diastolic pressure: 90 or above. What health risks are associated with hypertension? Managing your hypertension is an important  responsibility. Uncontrolled hypertension can lead to:  A heart attack.  A stroke.  A weakened blood vessel (aneurysm).  Heart failure.  Kidney damage.  Eye damage.  Metabolic syndrome.  Memory and concentration problems.  What changes can I make to manage my hypertension? Hypertension can be managed by making lifestyle changes and possibly by taking medicines. Your health care provider will help you make a plan to bring your blood pressure within a normal range. Eating and drinking  Eat a diet that is high in fiber and potassium, and low in salt (sodium), added sugar, and fat. An example eating plan is called the DASH (Dietary Approaches to Stop Hypertension) diet. To eat this way: ? Eat plenty of fresh fruits and vegetables. Try to fill half of your plate at each meal with fruits and vegetables. ? Eat whole grains, such as whole wheat pasta, brown rice, or whole grain bread. Fill about one quarter of your plate with whole grains. ? Eat low-fat diary products. ? Avoid fatty cuts of meat, processed or cured meats, and poultry with skin. Fill about one quarter of your plate with lean proteins such as fish, chicken without skin, beans, eggs, and tofu. ? Avoid premade and processed foods. These tend to be higher in sodium, added sugar, and fat.  Reduce your daily sodium intake. Most people with hypertension should eat less than 1,500 mg of sodium a day.  Limit alcohol intake to no more than 1 drink a day for nonpregnant women and 2 drinks a day for men. One drink equals 12 oz of beer, 5 oz of wine, or 1 oz of hard liquor. Lifestyle  Work with your health care provider to maintain a  healthy body weight, or to lose weight. Ask what an ideal weight is for you.  Get at least 30 minutes of exercise that causes your heart to beat faster (aerobic exercise) most days of the week. Activities may include walking, swimming, or biking.  Include exercise to strengthen your muscles (resistance  exercise), such as weight lifting, as part of your weekly exercise routine. Try to do these types of exercises for 30 minutes at least 3 days a week.  Do not use any products that contain nicotine or tobacco, such as cigarettes and e-cigarettes. If you need help quitting, ask your health care provider.  Control any long-term (chronic) conditions you have, such as high cholesterol or diabetes. Monitoring  Monitor your blood pressure at home as told by your health care provider. Your personal target blood pressure may vary depending on your medical conditions, your age, and other factors.  Have your blood pressure checked regularly, as often as told by your health care provider. Working with your health care provider  Review all the medicines you take with your health care provider because there may be side effects or interactions.  Talk with your health care provider about your diet, exercise habits, and other lifestyle factors that may be contributing to hypertension.  Visit your health care provider regularly. Your health care provider can help you create and adjust your plan for managing hypertension. Will I need medicine to control my blood pressure? Your health care provider may prescribe medicine if lifestyle changes are not enough to get your blood pressure under control, and if:  Your systolic blood pressure is 130 or higher.  Your diastolic blood pressure is 80 or higher.  Take medicines only as told by your health care provider. Follow the directions carefully. Blood pressure medicines must be taken as prescribed. The medicine does not work as well when you skip doses. Skipping doses also puts you at risk for problems. Contact a health care provider if:  You think you are having a reaction to medicines you have taken.  You have repeated (recurrent) headaches.  You feel dizzy.  You have swelling in your ankles.  You have trouble with your vision. Get help right away  if:  You develop a severe headache or confusion.  You have unusual weakness or numbness, or you feel faint.  You have severe pain in your chest or abdomen.  You vomit repeatedly.  You have trouble breathing. Summary  Hypertension is when the force of blood pumping through your arteries is too strong. If this condition is not controlled, it may put you at risk for serious complications.  Your personal target blood pressure may vary depending on your medical conditions, your age, and other factors. For most people, a normal blood pressure is less than 120/80.  Hypertension is managed by lifestyle changes, medicines, or both. Lifestyle changes include weight loss, eating a healthy, low-sodium diet, exercising more, and limiting alcohol. This information is not intended to replace advice given to you by your health care provider. Make sure you discuss any questions you have with your health care provider. Document Released: 05/06/2012 Document Revised: 07/10/2016 Document Reviewed: 07/10/2016 Elsevier Interactive Patient Education  [employer].

## 2017-07-09 NOTE — Progress Notes (Signed)
Pre visit review using our clinic review tool, if applicable. No additional management support is needed unless otherwise documented below in the visit note. 

## 2017-07-09 NOTE — Telephone Encounter (Signed)
Lab results given to daughter traci. Also informed of referral and BP medicine ordered and sent to pharmacy.

## 2017-07-10 ENCOUNTER — Ambulatory Visit: Payer: Self-pay

## 2017-07-10 NOTE — Telephone Encounter (Signed)
Patient aware.

## 2017-07-10 NOTE — Telephone Encounter (Signed)
Referral sent to vein and vascular specialist in [place]

## 2017-07-10 NOTE — Telephone Encounter (Signed)
She can increase to 5 mg daily   But she needs to stay at 5 mg daily for one week before asking again

## 2017-07-10 NOTE — Telephone Encounter (Signed)
Pt. Request that if call her, use cell phone [phone].

## 2017-07-10 NOTE — Telephone Encounter (Signed)
Patient was seen by [facility] yesterday. She started her on Amlodipine 2.5 mg. Patient called concerned because her blood pressure was 174/91. Rechecked again it was 174/95. Pulse was 91. Patient is wondering if she should increase her blood pressure medication?

## 2017-07-10 NOTE — Telephone Encounter (Signed)
Pt. Called to report her BP(left arm) at 1 p.m. 174/91. Recheck at 1:25 174/95 pulse 91. Pt. Concerned because she is having cataract surgery tomorrow.

## 2017-07-10 NOTE — Telephone Encounter (Signed)
Please triage

## 2017-07-11 DIAGNOSIS — H2511 Age-related nuclear cataract, right eye: Secondary | ICD-10-CM | POA: Diagnosis not present

## 2017-07-11 DIAGNOSIS — H2512 Age-related nuclear cataract, left eye: Secondary | ICD-10-CM | POA: Diagnosis not present

## 2017-07-15 ENCOUNTER — Telehealth: Payer: Self-pay

## 2017-07-15 NOTE — Telephone Encounter (Signed)
Copied from [place] [phone]. Topic: Referral - Question >> Jul 15, 2017  1:12 PM Robina Ade, Helene Kelp D wrote: Reason for CRM: Sharyn Lull from [facility] called and would like to talk to referral coordinator about patients referral that was sent. She can be reached at [phone], thanks.

## 2017-07-21 DIAGNOSIS — H2513 Age-related nuclear cataract, bilateral: Secondary | ICD-10-CM | POA: Diagnosis not present

## 2017-07-25 DIAGNOSIS — H2512 Age-related nuclear cataract, left eye: Secondary | ICD-10-CM | POA: Diagnosis not present

## 2017-08-01 DIAGNOSIS — H2513 Age-related nuclear cataract, bilateral: Secondary | ICD-10-CM | POA: Diagnosis not present

## 2017-08-20 ENCOUNTER — Telehealth: Payer: Self-pay

## 2017-08-20 ENCOUNTER — Encounter: Payer: Self-pay | Admitting: Family Medicine

## 2017-08-20 ENCOUNTER — Telehealth: Payer: Self-pay | Admitting: Family Medicine

## 2017-08-20 ENCOUNTER — Ambulatory Visit: Payer: PPO | Admitting: Family Medicine

## 2017-08-20 ENCOUNTER — Ambulatory Visit
Admission: RE | Admit: 2017-08-20 | Discharge: 2017-08-20 | Disposition: A | Discharge status: Home / Self Care | Payer: PPO | Source: Ambulatory Visit | Attending: Family Medicine | Admitting: Family Medicine

## 2017-08-20 VITALS — BP 172/60 | HR 97 | Temp 98.3°F | Ht 64.0 in

## 2017-08-20 DIAGNOSIS — M79662 Pain in left lower leg: Secondary | ICD-10-CM | POA: Diagnosis not present

## 2017-08-20 DIAGNOSIS — I10 Essential (primary) hypertension: Secondary | ICD-10-CM

## 2017-08-20 DIAGNOSIS — M7989 Other specified soft tissue disorders: Secondary | ICD-10-CM | POA: Insufficient documentation

## 2017-08-20 MED ORDER — CEPHALEXIN 500 MG PO CAPS: 500.0000 mg | ORAL_CAPSULE | Freq: Four times a day (QID) | ORAL | 0 refills | Status: DC

## 2017-08-20 MED ORDER — AMLODIPINE BESYLATE 5 MG PO TABS: 5.0000 mg | ORAL_TABLET | Freq: Every day | ORAL | 1 refills | Status: DC

## 2017-08-20 MED ORDER — HYDROCHLOROTHIAZIDE 12.5 MG PO TABS: 12.5000 mg | ORAL_TABLET | Freq: Every day | ORAL | 1 refills | Status: DC

## 2017-08-20 NOTE — Telephone Encounter (Signed)
Patients results from [place] has been added to chart. Please advise.

## 2017-08-20 NOTE — Assessment & Plan Note (Signed)
Patient with 4 days of swelling in her left lower given persistence and worsening concern would be for DVT.  It does not sound as though she has a history of DVT previously though may have had thrombophlebitis.  Will obtain an ultrasound to rule out DVT.  With the slight erythema distally would consider treatment for cellulitis if DVT is negative.  Ultrasound will be done today.  She is given return precautions.

## 2017-08-20 NOTE — Telephone Encounter (Signed)
See other phone note.  I already spoke with the patient.  Thanks.

## 2017-08-20 NOTE — Assessment & Plan Note (Signed)
Blood pressure elevated today.  Borderline above goal at home.  Will await the ultrasound results and consider increasing amlodipine versus trying an alternative medication.

## 2017-08-20 NOTE — Patient Instructions (Signed)
Nice to see you. We will get an ultrasound of your left leg and contact you with the results. If you develop chest pain or shortness of breath please go to the emergency room.

## 2017-08-20 NOTE — Telephone Encounter (Signed)
Spoke with patient regarding ultrasound results.  Negative for DVT.  Given erythema and tenderness and warmth as well as swelling we will cover her for cellulitis with Keflex.  Advised to take probiotic with this.  We will get her set up for recheck on Friday sometime between 830 and 10:00 on Friday morning.  We will change her to HCTZ from amlodipine in case that is contributing.  We will plan to recheck labs in 2 weeks.  Please get the patient scheduled for Friday and 2 weeks for lab work.

## 2017-08-20 NOTE — Progress Notes (Signed)
Tommi Rumps, MD Phone: [phone]  Lauren [name] is a 76 y.o. female who presents today for same-day visit.  Patient notes left leg swelling starting 4 days ago.  She notes some discomfort in the leg as well.  Notes the swelling is just getting worse.  Not improving.  Reports a history of a clot when she had her varicose veins stripped previously though reports she was not on anticoagulation.  She notes no injury to her leg.  Does note the distal dorsum of her foot will be slightly red and feel warm.  No fevers.  Blood pressures also been a little more elevated recently.  Has been in the 150s over 70s at home.  She is taking 5 mg of amlodipine.  No chest pain or shortness of breath.  Social History   Tobacco Use  Smoking Status Former Smoker  Smokeless Tobacco Never Used     ROS see history of present illness  Objective  Physical Exam Vitals:   08/20/17 1117 08/20/17 1119  BP: (!) 178/68 (!) 172/60  Pulse: 97   Temp: 98.3 F (36.8 C)   SpO2: 97%     BP Readings from Last 3 Encounters:  08/20/17 (!) 172/60  07/09/17 (!) 150/80  07/02/17 (!) 158/80   Wt Readings from Last 3 Encounters:  07/09/17 158 lb 6.7 oz (71.9 kg)  07/02/17 155 lb 3.2 oz (70.4 kg)  03/04/17 154 lb (69.9 kg)    Physical Exam  Constitutional: No distress.  Cardiovascular: Normal rate, regular rhythm and normal heart sounds.  Pulmonary/Chest: Effort normal and breath sounds normal.  Musculoskeletal:  Left lower extremity with swelling up to just distal to the knee, there is minimal erythema with no induration over the distal dorsum of her left foot, minimal tenderness in this area, some warmth in that area, no calf tenderness or cords, left calf measures 34 cm, right calf measures 32.5 cm, no apparent swelling  Neurological: She is alert. Gait normal.  Skin: Skin is warm and dry. She is not diaphoretic.     Assessment/Plan: Please see individual problem list.  Pain and swelling of left  lower leg Patient with 4 days of swelling in her left lower given persistence and worsening concern would be for DVT.  It does not sound as though she has a history of DVT previously though may have had thrombophlebitis.  Will obtain an ultrasound to rule out DVT.  With the slight erythema distally would consider treatment for cellulitis if DVT is negative.  Ultrasound will be done today.  She is given return precautions.  HTN (hypertension) Blood pressure elevated today.  Borderline above goal at home.  Will await the ultrasound results and consider increasing amlodipine versus trying an alternative medication.   Chezney was seen today for acute visit and hypertension.  Diagnoses and all orders for this visit:  Pain and swelling of left lower leg -     US Venous Img Lower Unilateral Left; Future  Hypertension, unspecified type -     amLODipine (NORVASC) 5 MG tablet; Take 1 tablet (5 mg total) by mouth daily.    Orders Placed This Encounter  Procedures  . US Venous Img Lower Unilateral Left    Standing Status:   Future    Standing Expiration Date:   10/21/2018    Order Specific Question:   Reason for Exam (SYMPTOM  OR DIAGNOSIS REQUIRED)    Answer:   left leg swelling and pain    Order Specific Question:  Preferred imaging location?    Answer:   [facility]    Order Specific Question:   Call Results- Best Contact Number?    Answer:   [phone]    Meds ordered this encounter  Medications  . amLODipine (NORVASC) 5 MG tablet    Sig: Take 1 tablet (5 mg total) by mouth daily.    Dispense:  90 tablet    Refill:  1     Tommi Rumps, MD [place]

## 2017-08-20 NOTE — Progress Notes (Signed)
Pre visit review using our clinic review tool, if applicable. No additional management support is needed unless otherwise documented below in the visit note. 

## 2017-08-21 ENCOUNTER — Telehealth: Payer: Self-pay | Admitting: Family Medicine

## 2017-08-21 NOTE — Telephone Encounter (Signed)
Is there another time tomorrow?

## 2017-08-21 NOTE — Telephone Encounter (Signed)
FYI

## 2017-08-21 NOTE — Telephone Encounter (Signed)
Copied from [place] [id number]. Topic: Quick Communication - Office Called Patient >> Aug 21, 2017  9:54 AM Robina Ade, Helene Kelp D wrote: Reason for CRM: Patient called and said that the appt for tomorrow she can not make it due to she has an appt for eye exam. Please call patient back to reschedule, thanks.

## 2017-08-21 NOTE — Telephone Encounter (Signed)
Will 3 pm work? There is an opening.

## 2017-08-21 NOTE — Telephone Encounter (Signed)
Patient notified and scheduled 

## 2017-08-21 NOTE — Telephone Encounter (Signed)
Patient is scheduled   

## 2017-08-22 ENCOUNTER — Encounter: Payer: Self-pay | Admitting: Family Medicine

## 2017-08-22 ENCOUNTER — Ambulatory Visit: Payer: PPO | Admitting: Family Medicine

## 2017-08-22 ENCOUNTER — Other Ambulatory Visit: Payer: Self-pay

## 2017-08-22 DIAGNOSIS — M79662 Pain in left lower leg: Secondary | ICD-10-CM | POA: Diagnosis not present

## 2017-08-22 DIAGNOSIS — M7989 Other specified soft tissue disorders: Secondary | ICD-10-CM | POA: Diagnosis not present

## 2017-08-22 NOTE — Patient Instructions (Addendum)
Nice to see you. Please finish the course of Keflex. Please discontinue the amlodipine. Please monitor your blood pressure. The swelling should continue to improve.  If the swelling does not improve please let us know.  If the redness and tenderness does not improve please let us know.  If you have spreading redness please be evaluated.  If you develop fevers please be evaluated.

## 2017-08-22 NOTE — Progress Notes (Signed)
  Tommi Rumps, MD Phone: [phone]  Lauren [name] is a 76 y.o. female who presents today for follow-up.  Patient seen 2 days ago for left leg swelling.  She had an ultrasound that was negative for DVT.  We discussed discontinuing the amlodipine though she has not done this yet.  We placed her on Keflex given concern for possible cellulitis with erythema to the distal dorsal left foot on the left side with some tenderness.  She notes the swelling has improved somewhat.  The erythema does appear slightly improved from my perspective.  Still slightly warm.  She has taken 6 doses of Keflex.  She has started on HCTZ for her blood pressure.  Social History   Tobacco Use  Smoking Status Former Smoker  Smokeless Tobacco Never Used     ROS see history of present illness  Objective  Physical Exam Vitals:   08/22/17 1445  BP: 130/60  Pulse: 81  Temp: 97.9 F (36.6 C)  SpO2: 97%    BP Readings from Last 3 Encounters:  08/22/17 130/60  08/20/17 (!) 172/60  07/09/17 (!) 150/80   Wt Readings from Last 3 Encounters:  08/22/17 156 lb (70.8 kg)  07/09/17 158 lb 6.7 oz (71.9 kg)  07/02/17 155 lb 3.2 oz (70.4 kg)    Physical Exam  Constitutional: No distress.  Pulmonary/Chest: Effort normal.  Musculoskeletal:  Improved left leg edema, distal dorsum of left foot with mild erythema that does appear improved to a small degree and much less warmth than prior, there is still tenderness in this area, no induration, feet are warm and well perfused, 5/5 strength left plantar flexion and dorsiflexion  Skin: She is not diaphoretic.     Assessment/Plan: Please see individual problem list.  Pain and swelling of left lower leg Has been improving.  Ultrasound was negative for DVT.  Erythema does appear somewhat improved to me and is less warm than prior.  She will finish her course of Keflex.  She will discontinue the amlodipine as previously discussed.  If swelling worsens she will let us  know.  If it does not continue to improve she will let us know.  If erythema spreads she will be evaluated.  If she develops fever she will be evaluated.  Given return precautions.   Lauren [name] was seen today for follow-up.  Diagnoses and all orders for this visit:  Pain and swelling of left lower leg    No orders of the defined types were placed in this encounter.   No orders of the defined types were placed in this encounter.    Tommi Rumps, MD [place]

## 2017-08-22 NOTE — Assessment & Plan Note (Signed)
Has been improving.  Ultrasound was negative for DVT.  Erythema does appear somewhat improved to me and is less warm than prior.  She will finish her course of Keflex.  She will discontinue the amlodipine as previously discussed.  If swelling worsens she will let us know.  If it does not continue to improve she will let us know.  If erythema spreads she will be evaluated.  If she develops fever she will be evaluated.  Given return precautions.

## 2017-09-02 DIAGNOSIS — H524 Presbyopia: Secondary | ICD-10-CM | POA: Diagnosis not present

## 2017-09-03 DIAGNOSIS — H1132 Conjunctival hemorrhage, left eye: Secondary | ICD-10-CM | POA: Diagnosis not present

## 2017-09-04 ENCOUNTER — Ambulatory Visit (INDEPENDENT_AMBULATORY_CARE_PROVIDER_SITE_OTHER): Payer: PPO | Admitting: Family Medicine

## 2017-09-04 ENCOUNTER — Encounter: Payer: Self-pay | Admitting: Family Medicine

## 2017-09-04 VITALS — BP 142/60 | HR 91 | Temp 97.6°F | Wt 156.8 lb

## 2017-09-04 DIAGNOSIS — I10 Essential (primary) hypertension: Secondary | ICD-10-CM | POA: Diagnosis not present

## 2017-09-04 DIAGNOSIS — M216X2 Other acquired deformities of left foot: Secondary | ICD-10-CM | POA: Diagnosis not present

## 2017-09-04 DIAGNOSIS — M79662 Pain in left lower leg: Secondary | ICD-10-CM

## 2017-09-04 DIAGNOSIS — E785 Hyperlipidemia, unspecified: Secondary | ICD-10-CM | POA: Diagnosis not present

## 2017-09-04 DIAGNOSIS — E663 Overweight: Secondary | ICD-10-CM

## 2017-09-04 DIAGNOSIS — M79672 Pain in left foot: Secondary | ICD-10-CM

## 2017-09-04 DIAGNOSIS — M7989 Other specified soft tissue disorders: Secondary | ICD-10-CM | POA: Diagnosis not present

## 2017-09-04 LAB — BASIC METABOLIC PANEL
BUN: 19 mg/dL (ref 6–23)
CALCIUM: 9 mg/dL (ref 8.4–10.5)
CHLORIDE: 104 meq/L (ref 96–112)
CO2: 29 meq/L (ref 19–32)
Creatinine, Ser: 0.9 mg/dL (ref 0.40–1.20)
GFR: 64.92 mL/min (ref >60.00)
Glucose, Bld: 94 mg/dL (ref 70–99)
Potassium: 3.4 mEq/L — ABNORMAL LOW (ref 3.5–5.1)
Sodium: 141 mEq/L (ref 135–145)

## 2017-09-04 NOTE — Patient Instructions (Signed)
Nice to see you. We will refer you to sports medicine for your left foot. Please continue to monitor your blood pressure and if it starts to increase please let us know.

## 2017-09-04 NOTE — Assessment & Plan Note (Signed)
Suspect discomfort is related to transverse arch collapse.  Possible nerve impingement related to this.  Will refer to sports medicine for further evaluation and consideration of orthotics.

## 2017-09-04 NOTE — Assessment & Plan Note (Signed)
Blood pressure is stable on hydrochlorothiazide.  Diastolic blood pressure limits what we can do with her medication.  She will monitor.  If blood pressure starts to go up she will let us know.  Check BMP today.

## 2017-09-04 NOTE — Assessment & Plan Note (Signed)
This issue has resolved.  Possibly was related to cellulitis versus medication.  She will monitor for recurrence.

## 2017-09-04 NOTE — Progress Notes (Signed)
Tommi Rumps, MD Phone: [phone]  Lauren [name] is a 77 y.o. female who presents today for f/u.  Hypertension: Typically 138-150 over 60s.  Taking HCTZ now.  No chest pain or shortness of breath.  Swelling has improved significantly.  Patient was previously treated and evaluated for left leg swelling.  Ultrasound was unremarkable.  Treated for cellulitis with Keflex and also discontinued amlodipine.  This has improved quite a bit and is back to her baseline.  She does report left foot discomfort in the ball of her left foot near the fourth metatarsal head.  Notes mild tingling at times in her toes.  Notes it feels like a rubber band is pulling on the bottom of her feet.  Notes it is tight.  Notes sensation is intact.  Has seen podiatry previously though was unhappy with with the orthotics that were provided to her.  Social History   Tobacco Use  Smoking Status Former Smoker  Smokeless Tobacco Never Used     ROS see history of present illness  Objective  Physical Exam Vitals:   09/04/17 0759  BP: (!) 142/60  Pulse: 91  Temp: 97.6 F (36.4 C)  SpO2: 97%    BP Readings from Last 3 Encounters:  09/04/17 (!) 142/60  08/22/17 130/60  08/20/17 (!) 172/60   Wt Readings from Last 3 Encounters:  09/04/17 156 lb 12.8 oz (71.1 kg)  08/22/17 156 lb (70.8 kg)  07/09/17 158 lb 6.7 oz (71.9 kg)    Physical Exam  Constitutional: No distress.  Cardiovascular: Normal rate, regular rhythm and normal heart sounds.  Pulmonary/Chest: Effort normal and breath sounds normal.  Musculoskeletal: She exhibits no edema.  Left foot with likely mild collapse of the transverse arch, the area that she is concerned about appears to be located at the fourth metatarsal head, it does feel to be slightly lower than the other metatarsal heads, does have some toe splaying in the left foot, no tenderness, no swelling, no erythema, light touch sensation intact, left foot appears warm with good  capillary refill  Neurological: She is alert. Gait normal.  Skin: Skin is warm and dry. She is not diaphoretic.     Assessment/Plan: Please see individual problem list.  HTN (hypertension) Blood pressure is stable on hydrochlorothiazide.  Diastolic blood pressure limits what we can do with her medication.  She will monitor.  If blood pressure starts to go up she will let us know.  Check BMP today.  Left foot pain Suspect discomfort is related to transverse arch collapse.  Possible nerve impingement related to this.  Will refer to sports medicine for further evaluation and consideration of orthotics.  Pain and swelling of left lower leg This issue has resolved.  Possibly was related to cellulitis versus medication.  She will monitor for recurrence.   Terrance was seen today for follow-up.  Diagnoses and all orders for this visit:  Loss of transverse plantar arch of left foot -     Ambulatory referral to Sports Medicine  Left foot pain -     Ambulatory referral to Sports Medicine  Essential hypertension -     Basic Metabolic Panel (BMET)  Hyperlipidemia, unspecified hyperlipidemia type -     Cancel: Comp Met (CMET) -     Cancel: Lipid panel  Overweight -     Cancel: Hemoglobin A1c  Pain and swelling of left lower leg    Orders Placed This Encounter  Procedures  . Ambulatory referral to Sports Medicine  Referral Priority:   Routine    Referral Type:   Consultation    Number of Visits Requested:   1    No orders of the defined types were placed in this encounter.    Tommi Rumps, MD [place]

## 2017-09-05 ENCOUNTER — Encounter: Payer: Self-pay | Admitting: Family Medicine

## 2017-09-18 ENCOUNTER — Encounter: Payer: Self-pay | Admitting: Family Medicine

## 2017-09-21 NOTE — Progress Notes (Signed)
[name] [place], [place] [address] Phone: [phone] Subjective:    I'm seeing this patient by the request  of:  Leone Haven, MD   CC: Foot pain  EHM:[id number]  Lauren [name] is a 77 y.o. female coming in with complaint of left foot pain. She has a history of plantar fascitis diagnosed in 2013. She has tried many braces which she says did not work. Two weeks ago she saw her PCP due to swelling in the foot. She was sent for images as the PCP thought she had a blood clot. She was given an antibiotic which did help improve the swelling and redness in her foot. If she stands for a long period of time the swelling does come back. Her pain is over the base of the 5th and radiates into the 4th toe. She also notes that she has a heel spur. She does have numbness and tingling in her toes and foot.        Past Medical History:  Diagnosis Date  . Allergic rhinitis   . Chickenpox   . Elevated LDL cholesterol level 11/26/2015  . Incontinence of urine in female 10/05/2015  . Migraines   . Urinary incontinence    Past Surgical History:  Procedure Laterality Date  . CHOLECYSTECTOMY  1995  . TONSILLECTOMY  1962   Social History   Socioeconomic History  . Marital status: Widowed    Spouse name: Not on file  . Number of children: Not on file  . Years of education: Not on file  . Highest education level: Not on file  Social Needs  . Financial resource strain: Not on file  . Food insecurity - worry: Not on file  . Food insecurity - inability: Not on file  . Transportation needs - medical: Not on file  . Transportation needs - non-medical: Not on file  Occupational History  . Not on file  Tobacco Use  . Smoking status: Former Research scientist (life sciences)  . Smokeless tobacco: Never Used  Substance and Sexual Activity  . Alcohol use: No    Alcohol/week: 0.0 oz  . Drug use: No  . Sexual activity: No  Other Topics Concern  . Not on file  Social History  Narrative  . Not on file   Allergies  Allergen Reactions  . Eggs Or Egg-Derived Products   . Hydrocodone-Guaifenesin Other (See Comments)    Cant sleep   . Influenza Vaccines Other (See Comments)    Nausea and vomiting and stomach pain for 10 weeks  . Mobic [Meloxicam] Swelling  . Oxybutynin     Memory loss per patient  . Sulfa Antibiotics Other (See Comments)    Cold sweats, passing out   . Codeine Rash    Cold sweats    Family History  Problem Relation Age of Onset  . Arthritis Unknown   . Heart disease Unknown   . Hypertension Unknown   . Hematuria Father      Past medical history, social, surgical and family history all reviewed in electronic medical record.  No pertanent information unless stated regarding to the chief complaint.   Review of Systems:Review of systems updated and as accurate as of 09/21/17  No headache, visual changes, nausea, vomiting, diarrhea, constipation, dizziness, abdominal pain, skin rash, fevers, chills, night sweats, weight loss, swollen lymph nodes, body aches, joint swelling, muscle aches, chest pain, shortness of breath, mood changes.   Objective  There were no vitals taken  for this visit. Systems examined below as of 09/21/17   General: No apparent distress alert and oriented x3 mood and affect normal, dressed appropriately.  HEENT: Pupils equal, extraocular movements intact  Respiratory: Patient's speak in full sentences and does not appear short of breath  Cardiovascular: No lower extremity edema, non tender, no erythema  Skin: Warm dry intact with no signs of infection or rash on extremities or on axial skeleton.  Abdomen: Soft nontender  Neuro: Cranial nerves II through XII are intact, neurovascularly intact in all extremities with 2+ DTRs and 2+ pulses.  Lymph: No lymphadenopathy of posterior or anterior cervical chain or axillae bilaterally.  Gait normal with good balance and coordination.  MSK:  Non tender with full range of  motion and good stability and symmetric strength and tone of shoulders, elbows, wrist, hip, knee and ankles bilaterally.  Mild arthritic changes of multiple joints  Left foot exam shows the patient does have overpronation of the hindfoot with standing the patient does have significant breakdown the transverse arch.  Positive squeeze test.  Pain between the fourth and fifth metatarsals.  No pain at the base of the fifth metatarsal.  Mild bunion and bunionette formation.  Limited musculoskeletal ultrasound was performed and interpreted by Lyndal Pulley  Limited ultrasound of patient's foot shows the patient does have a small neuroma between the fourth and fifth metatarsals.  Mild increase in Doppler flow.  No cortical defect noted. Impression: Neuroma    Impression and Recommendations:     This case required medical decision making of moderate complexity.      Note: This dictation was prepared with Dragon dictation along with smaller phrase technology. Any transcriptional errors that result from this process are unintentional.

## 2017-09-22 ENCOUNTER — Ambulatory Visit: Payer: Self-pay

## 2017-09-22 ENCOUNTER — Encounter: Payer: Self-pay | Admitting: Family Medicine

## 2017-09-22 ENCOUNTER — Ambulatory Visit (INDEPENDENT_AMBULATORY_CARE_PROVIDER_SITE_OTHER): Payer: PPO | Admitting: Family Medicine

## 2017-09-22 VITALS — BP 140/66 | Temp 89.0°F | Wt 158.0 lb

## 2017-09-22 DIAGNOSIS — G5762 Lesion of plantar nerve, left lower limb: Secondary | ICD-10-CM | POA: Diagnosis not present

## 2017-09-22 DIAGNOSIS — M79672 Pain in left foot: Secondary | ICD-10-CM

## 2017-09-22 DIAGNOSIS — M216X2 Other acquired deformities of left foot: Secondary | ICD-10-CM

## 2017-09-22 NOTE — Assessment & Plan Note (Signed)
Neuroma between the toes.  Wants to hold time.  Home exercises given to work on the transverse arch.  Discussed shoes and over-the-counter orthotics.  Topical anti-inflammatories given and discussed the potential for gabapentin which patient declined.  Follow-up again in 4 weeks

## 2017-09-22 NOTE — Patient Instructions (Signed)
Good to see you  Alvera Singh is your friend. 10 minuted before bed pennsaid pinkie amount topically 2 times daily as needed.  Exercises 3 times a week.  Continue with the good shoes [employer] orthotics "total support" online would be great  Over the counter consider  Turmeric 500mg  daily  B12 1049mcg daily  B6 200mg  daily  Vitamin D 2000 IU daily  Do not lace last eye on the shoe See me again in 4-5 weeks

## 2017-09-22 NOTE — Assessment & Plan Note (Signed)
Discussed with patient in great length.  Discussed metatarsal pads and over-the-counter orthotics.

## 2017-10-13 ENCOUNTER — Ambulatory Visit: Payer: PPO

## 2017-10-13 ENCOUNTER — Encounter: Payer: Self-pay | Admitting: Family Medicine

## 2017-10-13 ENCOUNTER — Ambulatory Visit (INDEPENDENT_AMBULATORY_CARE_PROVIDER_SITE_OTHER): Payer: PPO | Admitting: Family Medicine

## 2017-10-13 VITALS — BP 172/80 | HR 81 | Temp 98.2°F | Wt 157.8 lb

## 2017-10-13 DIAGNOSIS — R32 Unspecified urinary incontinence: Secondary | ICD-10-CM | POA: Diagnosis not present

## 2017-10-13 DIAGNOSIS — M216X2 Other acquired deformities of left foot: Secondary | ICD-10-CM

## 2017-10-13 DIAGNOSIS — Z1239 Encounter for other screening for malignant neoplasm of breast: Secondary | ICD-10-CM

## 2017-10-13 DIAGNOSIS — Z1231 Encounter for screening mammogram for malignant neoplasm of breast: Secondary | ICD-10-CM

## 2017-10-13 DIAGNOSIS — E538 Deficiency of other specified B group vitamins: Secondary | ICD-10-CM | POA: Insufficient documentation

## 2017-10-13 DIAGNOSIS — I10 Essential (primary) hypertension: Secondary | ICD-10-CM

## 2017-10-13 DIAGNOSIS — R7989 Other specified abnormal findings of blood chemistry: Secondary | ICD-10-CM

## 2017-10-13 LAB — LIPID PANEL
CHOLESTEROL: 195 mg/dL (ref 0–200)
HDL: 77.8 mg/dL (ref >39.00)
LDL CALC: 106 mg/dL — AB (ref 0–99)
NonHDL: 116.92
TRIGLYCERIDES: 57 mg/dL (ref 0.0–149.0)
Total CHOL/HDL Ratio: 3
VLDL: 11.4 mg/dL (ref 0.0–40.0)

## 2017-10-13 LAB — COMPREHENSIVE METABOLIC PANEL
ALBUMIN: 4.1 g/dL (ref 3.5–5.2)
ALK PHOS: 55 U/L (ref 39–117)
ALT: 10 U/L (ref 0–35)
AST: 17 U/L (ref 0–37)
BILIRUBIN TOTAL: 0.4 mg/dL (ref 0.2–1.2)
BUN: 22 mg/dL (ref 6–23)
CALCIUM: 9 mg/dL (ref 8.4–10.5)
CHLORIDE: 107 meq/L (ref 96–112)
CO2: 28 mEq/L (ref 19–32)
CREATININE: 0.87 mg/dL (ref 0.40–1.20)
GFR: 67.49 mL/min (ref >60.00)
Glucose, Bld: 99 mg/dL (ref 70–99)
Potassium: 4 mEq/L (ref 3.5–5.1)
SODIUM: 142 meq/L (ref 135–145)
TOTAL PROTEIN: 6.7 g/dL (ref 6.0–8.3)

## 2017-10-13 LAB — VITAMIN B12: VITAMIN B 12: 222 pg/mL (ref 211–911)

## 2017-10-13 NOTE — Progress Notes (Signed)
  Tommi Rumps, MD Phone: [phone]  Lauren [name] is a 77 y.o. female who presents today for follow-up.  Hypertension: Typically 138-142/60 at home.  No longer on medication.  No chest pain, shortness of breath, or edema.  She reports her overactive bladder is worse after coming off the hydrochlorothiazide.  She is going 4-5 times a night.  Also going frequently during the day.  No dysuria.  Similar to her prior symptoms.  She has follow-up with urology in several weeks.  She saw sports medicine for her foot pain.  Diagnosed with a neuroma.  Orthotics and topical pennsaid has been incredibly helpful.  Very little pain intermittently.  B12 is low normal previously.  Needs follow-up labs.  Social History   Tobacco Use  Smoking Status Former Smoker  Smokeless Tobacco Never Used     ROS see history of present illness  Objective  Physical Exam Vitals:   10/13/17 0829 10/13/17 0830  BP: (!) 144/80 (!) 172/80  Pulse:    Temp:    SpO2:      BP Readings from Last 3 Encounters:  10/13/17 (!) 172/80  09/22/17 140/66  09/04/17 (!) 142/60   Wt Readings from Last 3 Encounters:  10/13/17 157 lb 12.8 oz (71.6 kg)  09/22/17 158 lb (71.7 kg)  09/04/17 156 lb 12.8 oz (71.1 kg)    Physical Exam  Constitutional: No distress.  Cardiovascular: Normal rate, regular rhythm and normal heart sounds.  2+ radial pulses bilaterally  Pulmonary/Chest: Effort normal and breath sounds normal.  Musculoskeletal: She exhibits no edema.  Neurological: She is alert. Gait normal.  Skin: Skin is warm and dry. She is not diaphoretic.     Assessment/Plan: Please see individual problem list.  HTN (hypertension) No longer on medication.  Blood pressures have been stable at home though she is only checking in her left arm.  She will continue to monitor.  It does appear that she has a discrepancy between her arms previously and she will keep her appointment with vascular surgery in April.  Given  that her right arm appears more elevated we will have her start back on amlodipine 2.5 mg daily.  She has a prescription at home already.  Loss of transverse plantar arch of left foot Symptoms have improved.  She will see Dr. Tamala Julian in follow-up as planned.  Incontinence of urine in female Continues to have issues.  She will see urology as planned.  Low serum vitamin B12 Labs as outlined below.   Patient would prefer mammograms every 2 years after discussion.  She is coming up on being due for this.  Order has been placed and mammogram scheduled.  I discussed colon cancer screening as well with her.  She has cologuard at home and just needs to complete this.  Orders Placed This Encounter  Procedures  . MM SCREENING BREAST TOMO BILATERAL    Standing Status:   Future    Standing Expiration Date:   12/12/2018    Order Specific Question:   Reason for Exam (SYMPTOM  OR DIAGNOSIS REQUIRED)    Answer:   breast cancer screening    Order Specific Question:   Preferred imaging location?    [facility]    [facility]  . Lipid panel  . Comp Met (CMET)  . B12  . Methylmalonic Acid  . Homocysteine    No orders of the defined types were placed in this encounter.    Tommi Rumps, MD [place]

## 2017-10-13 NOTE — Assessment & Plan Note (Signed)
Continues to have issues.  She will see urology as planned.

## 2017-10-13 NOTE — Assessment & Plan Note (Addendum)
No longer on medication.  Blood pressures have been stable at home though she is only checking in her left arm.  She will continue to monitor.  It does appear that she has a discrepancy between her arms previously and she will keep her appointment with vascular surgery in April.  Given that her right arm appears more elevated we will have her start back on amlodipine 2.5 mg daily.  She has a prescription at home already.

## 2017-10-13 NOTE — Patient Instructions (Addendum)
Nice to see you. We will get your mammogram set up. We will contact you with your labs.  Please see urology as planned.  Please start back on amlodipine 2.5 mg daily.  Please alternate which arm you check your BP in.

## 2017-10-13 NOTE — Assessment & Plan Note (Signed)
Labs as outlined below. 

## 2017-10-13 NOTE — Assessment & Plan Note (Signed)
Symptoms have improved.  She will see Dr. Tamala Julian in follow-up as planned.

## 2017-10-16 LAB — METHYLMALONIC ACID, SERUM: METHYLMALONIC ACID, QUANT: 202 nmol/L (ref 87–318)

## 2017-10-16 LAB — HOMOCYSTEINE: HOMOCYSTEINE: 13.6 umol/L — AB (ref <10.4)

## 2017-10-18 NOTE — Progress Notes (Signed)
[name] [place], [place] [address] Phone: [phone] Subjective:    I'm seeing this patient by the request  of:    CC: Foot pain follow-up  [id number]  Lauren [name] is a 77 y.o. female coming in with complaint of foot pain.  Patient was found to have a neuroma collection of the transverse arch.  Patient will try over-the-counter medications, icing regimen, topical anti-inflammatories.  Patient was also could consider gabapentin but patient declined.  Home exercises given.  Patient states that she isn't having as much pain but more tingling in her foot. She said that she used pennsaid which did help. She notes that her right foot is starting to tingle all over too.      Past Medical History:  Diagnosis Date  . Allergic rhinitis   . Chickenpox   . Elevated LDL cholesterol level 11/26/2015  . Incontinence of urine in female 10/05/2015  . Migraines   . Urinary incontinence    Past Surgical History:  Procedure Laterality Date  . CHOLECYSTECTOMY  1995  . TONSILLECTOMY  1962   Social History   Socioeconomic History  . Marital status: Widowed    Spouse name: None  . Number of children: None  . Years of education: None  . Highest education level: None  Social Needs  . Financial resource strain: None  . Food insecurity - worry: None  . Food insecurity - inability: None  . Transportation needs - medical: None  . Transportation needs - non-medical: None  Occupational History  . None  Tobacco Use  . Smoking status: Former Research scientist (life sciences)  . Smokeless tobacco: Never Used  Substance and Sexual Activity  . Alcohol use: No    Alcohol/week: 0.0 oz  . Drug use: No  . Sexual activity: No  Other Topics Concern  . None  Social History Narrative  . None   Allergies  Allergen Reactions  . Eggs Or Egg-Derived Products   . Hydrocodone-Guaifenesin Other (See Comments)    Cant sleep   . Influenza Vaccines Other (See Comments)    Nausea and  vomiting and stomach pain for 10 weeks  . Mobic [Meloxicam] Swelling  . Oxybutynin     Memory loss per patient  . Sulfa Antibiotics Other (See Comments)    Cold sweats, passing out   . Codeine Rash    Cold sweats    Family History  Problem Relation Age of Onset  . Arthritis Unknown   . Heart disease Unknown   . Hypertension Unknown   . Hematuria Father      Past medical history, social, surgical and family history all reviewed in electronic medical record.  No pertanent information unless stated regarding to the chief complaint.   Review of Systems:Review of systems updated and as accurate as of 10/20/17  No headache, visual changes, nausea, vomiting, diarrhea, constipation, dizziness, abdominal pain, skin rash, fevers, chills, night sweats, weight loss, swollen lymph nodes, body aches, joint swelling, muscle aches, chest pain, shortness of breath, mood changes.   Objective  Blood pressure (!) 150/60, pulse 86, height 5\' 4"  (1.626 m), weight 154 lb (69.9 kg), SpO2 98 %. Systems examined below as of 10/20/17   General: No apparent distress alert and oriented x3 mood and affect normal, dressed appropriately.  HEENT: Pupils equal, extraocular movements intact  Respiratory: Patient's speak in full sentences and does not appear short of breath  Cardiovascular: No lower extremity edema, non tender, no erythema  Skin: Warm dry intact with no signs of infection or rash on extremities or on axial skeleton.  Abdomen: Soft nontender  Neuro: Cranial nerves II through XII are intact, neurovascularly intact in all extremities with 2+ DTRs and 2+ pulses.  Lymph: No lymphadenopathy of posterior or anterior cervical chain or axillae bilaterally.  Gait mild antalgic gait MSK:  Non tender with full range of motion and good stability and symmetric strength and tone of shoulders, elbows, wrist, hip, knee and ankles bilaterally.   Foot exam shows the patient does have breakdown of the transverse arch.   Still on the left foot does have a positive squeeze test.  Tenderness between the fourth and fifth metatarsals.      Impression and Recommendations:     This case required medical decision making of moderate complexity.      Note: This dictation was prepared with Dragon dictation along with smaller phrase technology. Any transcriptional errors that result from this process are unintentional.

## 2017-10-20 ENCOUNTER — Ambulatory Visit (INDEPENDENT_AMBULATORY_CARE_PROVIDER_SITE_OTHER): Payer: PPO | Admitting: Family Medicine

## 2017-10-20 ENCOUNTER — Encounter: Payer: Self-pay | Admitting: Family Medicine

## 2017-10-20 ENCOUNTER — Encounter: Payer: Self-pay | Admitting: *Deleted

## 2017-10-20 DIAGNOSIS — G5762 Lesion of plantar nerve, left lower limb: Secondary | ICD-10-CM | POA: Diagnosis not present

## 2017-10-20 NOTE — Patient Instructions (Signed)
Good to see you  Lets give it 5-6 weeks Keep doing everything else B6 200mg  daily with the shot See you in 5-6 weeks

## 2017-10-20 NOTE — Assessment & Plan Note (Signed)
Patient does have the neuroma noted.  Patient declined once again gabapentin or any type of injection.  Also declined formal physical therapy.  Patient wants to have her ABI done by her cardiologist first.  Wants to see if that shows anything.  After that we will discuss other follow-up.

## 2017-10-29 ENCOUNTER — Ambulatory Visit
Admission: RE | Admit: 2017-10-29 | Discharge: 2017-10-29 | Disposition: A | Discharge status: Home / Self Care | Payer: PPO | Source: Ambulatory Visit | Attending: Family Medicine | Admitting: Family Medicine

## 2017-10-29 DIAGNOSIS — Z1231 Encounter for screening mammogram for malignant neoplasm of breast: Secondary | ICD-10-CM | POA: Insufficient documentation

## 2017-10-29 DIAGNOSIS — Z1239 Encounter for other screening for malignant neoplasm of breast: Secondary | ICD-10-CM

## 2017-10-29 DIAGNOSIS — R928 Other abnormal and inconclusive findings on diagnostic imaging of breast: Secondary | ICD-10-CM | POA: Insufficient documentation

## 2017-11-03 ENCOUNTER — Other Ambulatory Visit: Payer: Self-pay | Admitting: Family Medicine

## 2017-11-03 DIAGNOSIS — R928 Other abnormal and inconclusive findings on diagnostic imaging of breast: Secondary | ICD-10-CM

## 2017-11-07 ENCOUNTER — Ambulatory Visit
Admission: RE | Admit: 2017-11-07 | Discharge: 2017-11-07 | Disposition: A | Discharge status: Home / Self Care | Payer: PPO | Source: Ambulatory Visit | Attending: Family Medicine | Admitting: Family Medicine

## 2017-11-07 ENCOUNTER — Other Ambulatory Visit: Payer: Self-pay | Admitting: Family Medicine

## 2017-11-07 DIAGNOSIS — R928 Other abnormal and inconclusive findings on diagnostic imaging of breast: Secondary | ICD-10-CM

## 2017-11-07 DIAGNOSIS — N6321 Unspecified lump in the left breast, upper outer quadrant: Secondary | ICD-10-CM | POA: Diagnosis not present

## 2017-11-08 ENCOUNTER — Other Ambulatory Visit: Payer: Self-pay | Admitting: Family Medicine

## 2017-11-08 DIAGNOSIS — E7211 Homocystinuria: Principal | ICD-10-CM

## 2017-11-08 DIAGNOSIS — R7989 Other specified abnormal findings of blood chemistry: Secondary | ICD-10-CM

## 2017-11-10 ENCOUNTER — Other Ambulatory Visit (INDEPENDENT_AMBULATORY_CARE_PROVIDER_SITE_OTHER): Payer: PPO

## 2017-11-10 DIAGNOSIS — R7989 Other specified abnormal findings of blood chemistry: Secondary | ICD-10-CM

## 2017-11-10 DIAGNOSIS — E7211 Homocystinuria: Secondary | ICD-10-CM

## 2017-11-10 LAB — FOLATE: FOLATE: 15.3 ng/mL (ref >5.9)

## 2017-11-11 ENCOUNTER — Other Ambulatory Visit: Payer: PPO

## 2017-11-11 ENCOUNTER — Ambulatory Visit: Payer: PPO

## 2017-11-13 ENCOUNTER — Encounter: Payer: Self-pay | Admitting: Urology

## 2017-11-13 ENCOUNTER — Ambulatory Visit: Payer: PPO | Admitting: Urology

## 2017-11-17 ENCOUNTER — Encounter: Payer: Self-pay | Admitting: Urology

## 2017-11-17 ENCOUNTER — Ambulatory Visit (INDEPENDENT_AMBULATORY_CARE_PROVIDER_SITE_OTHER): Payer: PPO | Admitting: Urology

## 2017-11-17 VITALS — BP 134/66 | HR 97 | Ht 64.0 in | Wt 154.8 lb

## 2017-11-17 DIAGNOSIS — N3946 Mixed incontinence: Secondary | ICD-10-CM

## 2017-11-17 NOTE — Progress Notes (Signed)
11/17/2017 2:40 PM   Lauren [name] 12-Oct-1940 [id number]  Referring provider: Leone Haven, MD [address] [place], [place] [address]  Chief Complaint  Patient presents with  . Urinary Incontinence    HPI: The patient is a 77 year old woman who has worsening incontinence over 1 year. She says she does not leak with stress maneuvers or with urgency. She leaks a small amount not associated with awareness during activity levels. She has a little bit of dampness some nights. She wears 3 pads per day moderately wet.  On the patient's last visit she had a small grade 2 cystocele and a double suburethral swelling noted. I felt that she had leakage is not associated awareness and mild enuresis and my index of suspicion is low that she had a diverticulum. I did order an MRI and this was normal I was going to start medication empirically and only order urodynamics if she does not reach her treatment goal. MRI was normal  the patient still leaks a little bit but she thinks she 75% better. She had dry mouth and constipation I believe from [place] and [employer]. Partial responder to oxybutynin  The beta 3 agonist did not help. We are going to represcribed the oxybutynin 10 mg once daily and I will see her in 1 year. We are not to pursue more aggressive treatments  Today  Incontinence persisting.  She leaks without awareness.  Frequency stable.  She had confusion with oxybutynin.  She may be having an x-ray or procedure of her carotid artery.  She needs a fine-needle biopsy of her breast lesion.  She would like to proceed with urodynamic     PMH: Past Medical History:  Diagnosis Date  . Allergic rhinitis   . Chickenpox   . Elevated LDL cholesterol level 11/26/2015  . Incontinence of urine in female 10/05/2015  . Migraines   . Urinary incontinence     Surgical History: Past Surgical History:  Procedure Laterality Date  . CHOLECYSTECTOMY  1995  . TONSILLECTOMY  1962     Home Medications:  Allergies as of 11/17/2017      Reactions   Eggs Or Egg-derived Products    Hydrocodone-guaifenesin Other (See Comments)   Cant sleep    Influenza Vaccines Other (See Comments)   Nausea and vomiting and stomach pain for 10 weeks   Mobic [meloxicam] Swelling   Oxybutynin    Memory loss per patient   Sulfa Antibiotics Other (See Comments)   Cold sweats, passing out    Codeine Rash   Cold sweats       Medication List        Accurate as of 11/17/17  2:40 PM. Always use your most recent med list.          amLODipine-atorvastatin 5-10 MG tablet Commonly known as:  CADUET Take 1 tablet by mouth daily.   hydrochlorothiazide 12.5 MG tablet Commonly known as:  HYDRODIURIL Take 1 tablet (12.5 mg total) by mouth daily.   latanoprost 0.005 % ophthalmic solution Commonly known as:  XALATAN       Allergies:  Allergies  Allergen Reactions  . Eggs Or Egg-Derived Products   . Hydrocodone-Guaifenesin Other (See Comments)    Cant sleep   . Influenza Vaccines Other (See Comments)    Nausea and vomiting and stomach pain for 10 weeks  . Mobic [Meloxicam] Swelling  . Oxybutynin     Memory loss per patient  . Sulfa Antibiotics Other (See Comments)  Cold sweats, passing out   . Codeine Rash    Cold sweats     Family History: Family History  Problem Relation Age of Onset  . Arthritis Unknown   . Heart disease Unknown   . Hypertension Unknown   . Hematuria Father     Social History:  reports that she has quit smoking. She has never used smokeless tobacco. She reports that she does not drink alcohol or use drugs.  ROS: UROLOGY Frequent Urination?: Yes Hard to postpone urination?: No Burning/pain with urination?: No Get up at night to urinate?: Yes Leakage of urine?: Yes Urine stream starts and stops?: No Trouble starting stream?: No Do you have to strain to urinate?: No Blood in urine?: No Urinary tract infection?: No Sexually transmitted  disease?: No Injury to kidneys or bladder?: No Painful intercourse?: No Weak stream?: No Currently pregnant?: No Vaginal bleeding?: No Last menstrual period?: n  Gastrointestinal Nausea?: No Vomiting?: No Indigestion/heartburn?: No Diarrhea?: No Constipation?: No  Constitutional Fever: No Night sweats?: No Weight loss?: No Fatigue?: No  Skin Skin rash/lesions?: No Itching?: No  Eyes Blurred vision?: No Double vision?: No  Ears/Nose/Throat Sore throat?: No Sinus problems?: No  Hematologic/Lymphatic Swollen glands?: No Easy bruising?: No  Cardiovascular Leg swelling?: No Chest pain?: No  Respiratory Cough?: No Shortness of breath?: No  Endocrine Excessive thirst?: No  Musculoskeletal Back pain?: No Joint pain?: No  Neurological Headaches?: No Dizziness?: No  Psychologic Depression?: No Anxiety?: No  Physical Exam: BP 134/66 (BP Location: Left Arm, Patient Position: Sitting, Cuff Size: Normal)   Pulse 97   Ht 5\' 4"  (1.626 m)   Wt 154 lb 12.8 oz (70.2 kg)   BMI 26.57 kg/m   Constitutional:  Alert and oriented, No acute distress.   Laboratory Data: No results found for: WBC, HGB, HCT, MCV, PLT  Lab Results  Component Value Date   CREATININE 0.87 10/13/2017    No results found for: PSA  No results found for: TESTOSTERONE  No results found for: HGBA1C  Urinalysis    Component Value Date/Time   APPEARANCEUR Clear 11/03/2015 1048   GLUCOSEU Negative 11/03/2015 1048   BILIRUBINUR Negative 11/03/2015 1048   PROTEINUR Negative 11/03/2015 1048   NITRITE Negative 11/03/2015 1048   LEUKOCYTESUR Negative 11/03/2015 1048    Pertinent Imaging: None  Assessment & Plan: Reevaluate with urodynamics in about 6 weeks.  She may need a repeat pelvic examination pending results  There are no diagnoses linked to this encounter.  No follow-ups on file.  Reece Packer, MD  [facility] [address], [place], [place] [address] [phone]

## 2017-11-18 ENCOUNTER — Ambulatory Visit
Admission: RE | Admit: 2017-11-18 | Discharge: 2017-11-18 | Disposition: A | Discharge status: Home / Self Care | Payer: PPO | Source: Ambulatory Visit | Attending: Family Medicine | Admitting: Family Medicine

## 2017-11-18 DIAGNOSIS — N62 Hypertrophy of breast: Secondary | ICD-10-CM | POA: Diagnosis not present

## 2017-11-18 DIAGNOSIS — R928 Other abnormal and inconclusive findings on diagnostic imaging of breast: Secondary | ICD-10-CM

## 2017-11-18 DIAGNOSIS — N6323 Unspecified lump in the left breast, lower outer quadrant: Secondary | ICD-10-CM | POA: Diagnosis not present

## 2017-11-18 DIAGNOSIS — N6489 Other specified disorders of breast: Secondary | ICD-10-CM | POA: Diagnosis not present

## 2017-11-18 DIAGNOSIS — N6321 Unspecified lump in the left breast, upper outer quadrant: Secondary | ICD-10-CM | POA: Diagnosis not present

## 2017-11-19 ENCOUNTER — Encounter: Payer: Self-pay | Admitting: *Deleted

## 2017-11-19 ENCOUNTER — Ambulatory Visit (INDEPENDENT_AMBULATORY_CARE_PROVIDER_SITE_OTHER): Payer: PPO | Admitting: *Deleted

## 2017-11-19 VITALS — BP 140/60 | HR 84 | Resp 14

## 2017-11-19 DIAGNOSIS — I10 Essential (primary) hypertension: Secondary | ICD-10-CM

## 2017-11-19 LAB — SURGICAL PATHOLOGY

## 2017-11-19 NOTE — Progress Notes (Signed)
Patient presented for  1 month FU on BP taken in left arm 118/60 pulse 86, Patient to start back on  Amlodipine 2.5 mg daily per note but per patient taking caduet 5-10 daily. Patient also ask while in office could she receive a pneumonia vaccine consulted chart and saw patient recently had back to back pneumovax 2017 and 2018, so consulted PCP advised to wait until can speak with Pharm D.

## 2017-11-20 NOTE — Progress Notes (Signed)
Patient notified and voiced understanding.

## 2017-11-24 ENCOUNTER — Other Ambulatory Visit: Payer: Self-pay

## 2017-11-24 ENCOUNTER — Telehealth: Payer: Self-pay | Admitting: Family Medicine

## 2017-11-24 DIAGNOSIS — I10 Essential (primary) hypertension: Secondary | ICD-10-CM

## 2017-11-24 NOTE — Telephone Encounter (Signed)
Discussed with clinical pharmacist.  It appears that she may have received 2 Pneumovax vaccines.  Given that it has been greater than a year since her last one she should be okay to receive Prevnar.  I did discuss with the pharmacist that if she had received the Prevnar and it was documented incorrectly would she should be okay to receive Prevnar again.  She stated the patient should be okay to receive the Prevnar.

## 2017-11-24 NOTE — Telephone Encounter (Signed)
-----   Message from Carlean Jews, [facility] sent at 11/20/2017  6:02 PM EDT ----- Yes, OK to give prevnar at least 1 year after pneumovax if she hasn't had one since she's been 77 yo.   Chrys Racer ----- Message ----- From: Leone Haven, MD Sent: 11/19/2017  12:21 PM To: Carlean Jews, [place],   It looks like this patient possibly received 2 pneumovax vaccines one in 2017 and one in 2018. I am not sure whether or not this was entered incorrectly and she actually received a prevnar for one of them. Would she be ok to receive the prevnar at this time if she infact received 2 pneumovax vaccines? And, if she did receive the prevnar previously and it was documented incorrectly, would she be able to receive it again? Let me know what you think and if you have any other questions. Thanks.   Randall Hiss

## 2017-11-25 NOTE — Telephone Encounter (Signed)
Per DPR left patient detailed message ok to schedule Prevnar.

## 2017-11-26 ENCOUNTER — Ambulatory Visit: Payer: PPO

## 2017-11-26 ENCOUNTER — Ambulatory Visit (INDEPENDENT_AMBULATORY_CARE_PROVIDER_SITE_OTHER): Payer: PPO | Admitting: Vascular Surgery

## 2017-11-26 ENCOUNTER — Other Ambulatory Visit: Payer: Self-pay

## 2017-11-26 ENCOUNTER — Ambulatory Visit (HOSPITAL_COMMUNITY)
Admission: RE | Admit: 2017-11-26 | Discharge: 2017-11-26 | Disposition: A | Discharge status: Home / Self Care | Payer: PPO | Source: Ambulatory Visit | Attending: Vascular Surgery | Admitting: Vascular Surgery

## 2017-11-26 ENCOUNTER — Encounter: Payer: Self-pay | Admitting: Vascular Surgery

## 2017-11-26 VITALS — BP 150/80 | HR 84 | Resp 18 | Ht 64.0 in | Wt 151.7 lb

## 2017-11-26 DIAGNOSIS — I10 Essential (primary) hypertension: Secondary | ICD-10-CM

## 2017-11-26 DIAGNOSIS — Z87891 Personal history of nicotine dependence: Secondary | ICD-10-CM | POA: Diagnosis not present

## 2017-11-26 DIAGNOSIS — I6523 Occlusion and stenosis of bilateral carotid arteries: Secondary | ICD-10-CM | POA: Diagnosis not present

## 2017-11-26 NOTE — Progress Notes (Signed)
Patient name: Lauren [name] MRN: [id number] DOB: [birth year] Sex: female   REASON FOR CONSULT:    New onset hypertension.  Also discrepancy in blood pressure in both arms.  The consult is requested by Dr. Caryl Bis  HPI:   Lauren [name] is a pleasant 77 y.o. female, who was referred because of hypertension and because of a blood pressure discrepancy in both arms.  Patient tells me that when she is in the office the blood pressure in the right arm is consistently 40 points higher than the blood pressure in the left arm.  The highest that she can remember her systolic blood pressure being in the right arm was 170 mmHg.  Currently she is on amlodipine for her blood pressure only.  Risk factors for peripheral vascular disease include hypertension.  She denies any history of diabetes, hypercholesterolemia, family history of premature cardiovascular disease, or smoking history.  She does not note any acceleration in her blood pressure.  She denies any headaches or hematuria.  Past Medical History:  Diagnosis Date  . Allergic rhinitis   . Chickenpox   . Elevated LDL cholesterol level 11/26/2015  . Incontinence of urine in female 10/05/2015  . Migraines   . Urinary incontinence     Family History  Problem Relation Age of Onset  . Arthritis Unknown   . Heart disease Unknown   . Hypertension Unknown   . Hematuria Father     SOCIAL HISTORY: Social History   Socioeconomic History  . Marital status: Widowed    Spouse name: Not on file  . Number of children: Not on file  . Years of education: Not on file  . Highest education level: Not on file  Occupational History  . Not on file  Social Needs  . Financial resource strain: Not on file  . Food insecurity:    Worry: Not on file    Inability: Not on file  . Transportation needs:    Medical: Not on file    Non-medical: Not on file  Tobacco Use  . Smoking status: Former Research scientist (life sciences)  . Smokeless tobacco: Never Used  Substance and Sexual  Activity  . Alcohol use: No    Alcohol/week: 0.0 oz  . Drug use: No  . Sexual activity: Never  Lifestyle  . Physical activity:    Days per week: Not on file    Minutes per session: Not on file  . Stress: Not on file  Relationships  . Social connections:    Talks on phone: Not on file    Gets together: Not on file    Attends religious service: Not on file    Active member of club or organization: Not on file    Attends meetings of clubs or organizations: Not on file    Relationship status: Not on file  . Intimate partner violence:    Fear of current or ex partner: Not on file    Emotionally abused: Not on file    Physically abused: Not on file    Forced sexual activity: Not on file  Other Topics Concern  . Not on file  Social History Narrative  . Not on file    Allergies  Allergen Reactions  . Eggs Or Egg-Derived Products   . Hydrocodone-Guaifenesin Other (See Comments)    Cant sleep   . Influenza Vaccines Other (See Comments)    Nausea and vomiting and stomach pain for 10 weeks  . Mobic [Meloxicam] Swelling  . Oxybutynin  Memory loss per patient  . Sulfa Antibiotics Other (See Comments)    Cold sweats, passing out   . Codeine Rash    Cold sweats     Current Outpatient Medications  Medication Sig Dispense Refill  . amLODipine-atorvastatin (CADUET) 5-10 MG tablet Take 1 tablet by mouth daily.    . hydrochlorothiazide (HYDRODIURIL) 12.5 MG tablet Take 1 tablet (12.5 mg total) by mouth daily. 90 tablet 1  . latanoprost (XALATAN) 0.005 % ophthalmic solution   1   Current Facility-Administered Medications  Medication Dose Route Frequency Provider Last Rate Last Dose  . betamethasone acetate-betamethasone sodium phosphate (CELESTONE) injection 3 mg  3 mg Intramuscular Once Edrick Kins, DPM        REVIEW OF SYSTEMS:  [X]  denotes positive finding, [ ]  denotes negative finding Cardiac  Comments:  Chest pain or chest pressure:    Shortness of breath upon  exertion:    Short of breath when lying flat:    Irregular heart rhythm:        Vascular    Pain in calf, thigh, or hip brought on by ambulation:    Pain in feet at night that wakes you up from your sleep:     Blood clot in your veins:    Leg swelling:         Pulmonary    Oxygen at home:    Productive cough:     Wheezing:         Neurologic    Sudden weakness in arms or legs:     Sudden numbness in arms or legs:     Sudden onset of difficulty speaking or slurred speech:    Temporary loss of vision in one eye:     Problems with dizziness:         Gastrointestinal    Blood in stool:     Vomited blood:         Genitourinary    Burning when urinating:     Blood in urine:        Psychiatric    Major depression:         Hematologic    Bleeding problems:    Problems with blood clotting too easily:        Skin    Rashes or ulcers:        Constitutional    Fever or chills:     PHYSICAL EXAM:   Vitals:   11/26/17 1407 11/26/17 1408  BP: (!) 148/79 (!) 150/80  Pulse: 84   Resp: 18   SpO2: (!) 88%   Weight: 151 lb 11.2 oz (68.8 kg)   Height: 5\' 4"  (1.626 m)     GENERAL: The patient is a well-nourished female, in no acute distress. The vital signs are documented above. CARDIAC: There is a regular rate and rhythm.  VASCULAR: I do not detect carotid bruits. She has palpable femoral, popliteal, and posterior tibial pulses bilaterally. I do not appreciate a subclavian bruit on either side. She has palpable brachial and radial pulses bilaterally. Blood pressure in the right arm here was 148/79 and blood pressure in the left arm was 150/80. She has no significant lower extremity swelling. PULMONARY: There is good air exchange bilaterally without wheezing or rales. ABDOMEN: Soft and non-tender with normal pitched bowel sounds.  I do not appreciate any abdominal bruits. MUSCULOSKELETAL: There are no major deformities or cyanosis. NEUROLOGIC: No focal weakness or  paresthesias are detected. SKIN: There are no ulcers  or rashes noted. PSYCHIATRIC: The patient has a normal affect.  DATA:    CAROTID DUPLEX: I have inability interpreted her carotid duplex scan today.  There is a less than 39% stenosis bilaterally.  The subclavian arteries were also evaluated and there was no significant stenosis noted in either subclavian artery.  MEDICAL ISSUES:   HYPERTENSION: Currently I do not see any reason to suspect a renal artery stenosis as she is not had a rapid acceleration in her blood pressure and it seems to be under reasonable control on one medication.  I explained that even if we were concerned that she may have a renal artery stenosis, which I do not think she has, the evidence suggest that maximal medical therapy is the safest approach compared to renal artery angioplasty.  Therefore I would not recommend an aggressive approach to look for renal artery stenosis at this point.  With respect to the blood pressure discrepancy in both arms, I do not see any evidence of a subclavian artery stenosis.  She has equal brachial and radial pulses, she has no evidence of reverse flow in her vertebral arteries and no evidence of subclavian stenosis by duplex.  Regardless, even if we did suspect a subclavian artery stenosis we typically do not work this up further unless they are having persistent problems with dizziness.  Thus I do not think any further workup is indicated at this time.  I be happy to see her back at any time if any new vascular issues arise.  Deitra Mayo [facility] [phone]

## 2017-11-28 ENCOUNTER — Telehealth: Payer: Self-pay

## 2017-11-28 NOTE — Telephone Encounter (Signed)
Patient returning call from [place]. She wants to know how soon does Dr. Caryl Bis want her in for a nurse visit?

## 2017-11-28 NOTE — Telephone Encounter (Signed)
Tried calling x 2, no voicemail. [place] for pec to speak to patient and schedule nurse visit

## 2017-11-28 NOTE — Telephone Encounter (Signed)
-----   Message from Leone Haven, MD sent at 11/26/2017  5:56 PM EDT ----- Regarding: Please call the patient It looks like the patient saw vascular surgery.  I suspect there may have been a typo though her oxygenation appears to have been 88%.  Please see if we could have her come into the office and have someone check her oxygenation here.  Thanks.  Randall Hiss. ----- Message ----- From: Angelia Mould, MD Sent: 11/26/2017   3:01 PM To: Leone Haven, MD

## 2017-12-01 ENCOUNTER — Ambulatory Visit: Payer: PPO

## 2017-12-01 NOTE — Telephone Encounter (Signed)
Patient is going to come in tomorrow to have her oxygen rechecked, please schedule her foe nurse visit on 12/02/17 at 0830, I will check her oxygen since Dr.Sonnnenberg will be in late due to meeting.

## 2017-12-01 NOTE — Telephone Encounter (Signed)
Scheduled

## 2017-12-02 ENCOUNTER — Ambulatory Visit: Payer: PPO

## 2017-12-02 DIAGNOSIS — R7981 Abnormal blood-gas level: Secondary | ICD-10-CM

## 2017-12-02 NOTE — Progress Notes (Signed)
I have reviewed the above note and agree. O2 normal.   Tommi Rumps, M.D.

## 2017-12-02 NOTE — Progress Notes (Signed)
Patient comes in today per recommendation form PCP to check oxygen level. Patient was seen by Vascular surgery and had an oxygen level of 88%. Patient states she did have a chest cold last week but feels much better this week. Today her oxygen is 98%.

## 2017-12-03 ENCOUNTER — Encounter: Payer: Self-pay | Admitting: Family Medicine

## 2017-12-03 ENCOUNTER — Ambulatory Visit: Payer: PPO | Admitting: Family Medicine

## 2017-12-03 ENCOUNTER — Ambulatory Visit: Payer: Self-pay

## 2017-12-03 ENCOUNTER — Ambulatory Visit (INDEPENDENT_AMBULATORY_CARE_PROVIDER_SITE_OTHER): Payer: PPO | Admitting: Family Medicine

## 2017-12-03 VITALS — BP 146/60 | HR 83 | Ht 64.0 in | Wt 154.0 lb

## 2017-12-03 DIAGNOSIS — M79671 Pain in right foot: Secondary | ICD-10-CM

## 2017-12-03 DIAGNOSIS — M79672 Pain in left foot: Secondary | ICD-10-CM | POA: Diagnosis not present

## 2017-12-03 DIAGNOSIS — G5762 Lesion of plantar nerve, left lower limb: Secondary | ICD-10-CM | POA: Diagnosis not present

## 2017-12-03 NOTE — Patient Instructions (Signed)
Good to see you  Lauren [name] is your friend.  Keep doing everything else I am very excited to see how you do  Still avoid being barefoot If doing the B6 lets go down to 100mg  daily  See me again in 6 weeks just to makes sure all the way or mostly gone.

## 2017-12-03 NOTE — Assessment & Plan Note (Signed)
Injected today.  Tolerated the procedure well.  We discussed icing regimen and home exercises.  We discussed proper shoes and over-the-counter orthotics.  Discussed trying the gabapentin on a more regular basis.  Hopefully this will be beneficial.  Follow-up again in 6 weeks

## 2017-12-03 NOTE — Progress Notes (Signed)
Lauren [name] [place], [place] [address] Phone: [phone] Subjective:    I'm seeing this patient by the request  of:    CC: Foot pain follow-up  [id number]  Lauren [name] is a 77 y.o. female coming in with complaint of foot pain.  Was found to have a left-sided neuroma.  Patient has been doing the home exercise, icing regimen, not taking the medications though regularly.  Patient states the pain is only moderately improved at this time.  Still irritable.  Wanting to know what else can be done.       Past Medical History:  Diagnosis Date  . Allergic rhinitis   . Chickenpox   . Elevated LDL cholesterol level 11/26/2015  . Incontinence of urine in female 10/05/2015  . Migraines   . Urinary incontinence    Past Surgical History:  Procedure Laterality Date  . CHOLECYSTECTOMY  1995  . TONSILLECTOMY  1962   Social History   Socioeconomic History  . Marital status: Widowed    Spouse name: Not on file  . Number of children: Not on file  . Years of education: Not on file  . Highest education level: Not on file  Occupational History  . Not on file  Social Needs  . Financial resource strain: Not on file  . Food insecurity:    Worry: Not on file    Inability: Not on file  . Transportation needs:    Medical: Not on file    Non-medical: Not on file  Tobacco Use  . Smoking status: Former Research scientist (life sciences)  . Smokeless tobacco: Never Used  Substance and Sexual Activity  . Alcohol use: No    Alcohol/week: 0.0 oz  . Drug use: No  . Sexual activity: Never  Lifestyle  . Physical activity:    Days per week: Not on file    Minutes per session: Not on file  . Stress: Not on file  Relationships  . Social connections:    Talks on phone: Not on file    Gets together: Not on file    Attends religious service: Not on file    Active member of club or organization: Not on file    Attends meetings of clubs or organizations: Not on file   Relationship status: Not on file  Other Topics Concern  . Not on file  Social History Narrative  . Not on file   Allergies  Allergen Reactions  . Eggs Or Egg-Derived Products   . Hydrocodone-Guaifenesin Other (See Comments)    Cant sleep   . Influenza Vaccines Other (See Comments)    Nausea and vomiting and stomach pain for 10 weeks  . Mobic [Meloxicam] Swelling  . Oxybutynin     Memory loss per patient  . Sulfa Antibiotics Other (See Comments)    Cold sweats, passing out   . Codeine Rash    Cold sweats    Family History  Problem Relation Age of Onset  . Arthritis Unknown   . Heart disease Unknown   . Hypertension Unknown   . Hematuria Father      Past medical history, social, surgical and family history all reviewed in electronic medical record.  No pertanent information unless stated regarding to the chief complaint.   Review of Systems:Review of systems updated and as accurate as of 12/03/17  No headache, visual changes, nausea, vomiting, diarrhea, constipation, dizziness, abdominal pain, skin rash, fevers, chills, night sweats, weight loss, swollen lymph nodes,  body aches, joint swelling, muscle aches, chest pain, shortness of breath, mood changes.   Objective  Blood pressure (!) 146/60, pulse 83, height 5\' 4"  (1.626 m), weight 154 lb (69.9 kg), SpO2 97 %. Systems examined below as of 12/03/17   General: No apparent distress alert and oriented x3 mood and affect normal, dressed appropriately.  HEENT: Pupils equal, extraocular movements intact  Respiratory: Patient's speak in full sentences and does not appear short of breath  Cardiovascular: No lower extremity edema, non tender, no erythema  Skin: Warm dry intact with no signs of infection or rash on extremities or on axial skeleton.  Abdomen: Soft nontender  Neuro: Cranial nerves II through XII are intact, neurovascularly intact in all extremities with 2+ DTRs and 2+ pulses.  Lymph: No lymphadenopathy of posterior  or anterior cervical chain or axillae bilaterally.  Gait normal with good balance and coordination.  MSK:  Non tender with full range of motion and good stability and symmetric strength and tone of shoulders, elbows, wrist, hip, knee and ankles bilaterally.  Foot exam shows mild bluish hue noted of the foot.  Patient does have a positive squeeze test.  Pain in between the second and third metatarsal heads.  Positive squeeze test.  Significant breakdown of the transverse arch  Procedure: Real-time Ultrasound Guided Injection of left neuroma of foot Device: GE Logiq Q7 Ultrasound guided injection is preferred based studies that show increased duration, increased effect, greater accuracy, decreased procedural pain, increased response rate, and decreased cost with ultrasound guided versus blind injection.  Verbal informed consent obtained.  Time-out conducted.  Noted no overlying erythema, induration, or other signs of local infection.  Skin prepped in a sterile fashion.  Local anesthesia: Topical Ethyl chloride.  With sterile technique and under real time ultrasound guidance: With a 25-gauge half inch needle patient was injected with 0.5 cc of 0.5% Marcaine and 0.5 cc of Kenalog 40 mg/mL Completed without difficulty  Pain immediately resolved suggesting accurate placement of the medication.  Advised to call if fevers/chills, erythema, induration, drainage, or persistent bleeding.  Images permanently stored and available for review in the ultrasound unit.  Impression: Technically successful ultrasound guided injection.   Impression and Recommendations:     This case required medical decision making of moderate complexity.      Note: This dictation was prepared with Dragon dictation along with smaller phrase technology. Any transcriptional errors that result from this process are unintentional.

## 2017-12-10 ENCOUNTER — Ambulatory Visit (INDEPENDENT_AMBULATORY_CARE_PROVIDER_SITE_OTHER): Payer: PPO

## 2017-12-10 VITALS — BP 132/62 | HR 74 | Temp 98.3°F | Resp 14 | Ht 64.0 in | Wt 153.8 lb

## 2017-12-10 DIAGNOSIS — Z0000 Encounter for general adult medical examination without abnormal findings: Secondary | ICD-10-CM

## 2017-12-10 DIAGNOSIS — Z23 Encounter for immunization: Secondary | ICD-10-CM

## 2017-12-10 NOTE — Patient Instructions (Addendum)
  Lauren [name] , Thank you for taking time to come for your Medicare Wellness Visit. I appreciate your ongoing commitment to your health goals. Please review the following plan we discussed and let me know if I can assist you in the future.   These are the goals we discussed: Goals    . Maintain Healthy Lifestyle     Stay active Stay hydrated  Healthy diet ( monitor chocolate intake)       This is a list of the screening recommended for you and due dates:  Health Maintenance  Topic Date Due  . Colon Cancer Screening  03/25/2018*  . Flu Shot  03/26/2018  . Tetanus Vaccine  08/31/2019  . DEXA scan (bone density measurement)  Completed  . Pneumonia vaccines  Completed  *Topic was postponed. The date shown is not the original due date.

## 2017-12-10 NOTE — Progress Notes (Signed)
Subjective:   Lauren [name] is a 77 y.o. female who presents for Medicare Annual (Subsequent) preventive examination.  Review of Systems:  No ROS.  Medicare Wellness Visit. Additional risk factors are reflected in the social history.  Cardiac Risk Factors include: advanced age (>46men, >34 women);hypertension     Objective:     Vitals: BP 132/62 (BP Location: Left Arm, Patient Position: Sitting, Cuff Size: Normal)   Pulse 74   Temp 98.3 F (36.8 C) (Oral)   Resp 14   Ht 5\' 4"  (1.626 m)   Wt 153 lb 12.8 oz (69.8 kg)   SpO2 98%   BMI 26.40 kg/m   Body mass index is 26.4 kg/m.  Advanced Directives 12/10/2017 11/25/2016 11/08/2015  Does Patient Have a Medical Advance Directive? Yes Yes Yes  Type of Advance Directive Living will Living will;Healthcare Power of [place];Living will  Does patient want to make changes to medical advance directive? No - Patient declined No - Patient declined No - Patient declined  Copy of [place] in Chart? - No - copy requested Yes    Tobacco Social History   Tobacco Use  Smoking Status Former Smoker  Smokeless Tobacco Never Used     Counseling given: Not Answered   Clinical Intake:  Pre-visit preparation completed: Yes  Pain : No/denies pain     Nutritional Status: BMI 25 -29 Overweight Diabetes: No  How often do you need to have someone help you when you read instructions, pamphlets, or other written materials from your doctor or pharmacy?: 1 - Never  Interpreter Needed?: No     Past Medical History:  Diagnosis Date  . Allergic rhinitis   . Chickenpox   . Elevated LDL cholesterol level 11/26/2015  . Incontinence of urine in female 10/05/2015  . Migraines   . Urinary incontinence    Past Surgical History:  Procedure Laterality Date  . CHOLECYSTECTOMY  1995  . TONSILLECTOMY  1962   Family History  Problem Relation Age of Onset  . Arthritis Unknown   . Heart disease  Unknown   . Hypertension Unknown   . Hematuria Father    Social History   Socioeconomic History  . Marital status: Widowed    Spouse name: Not on file  . Number of children: Not on file  . Years of education: Not on file  . Highest education level: Not on file  Occupational History  . Not on file  Social Needs  . Financial resource strain: Not hard at all  . Food insecurity:    Worry: Never true    Inability: Never true  . Transportation needs:    Medical: No    Non-medical: No  Tobacco Use  . Smoking status: Former Research scientist (life sciences)  . Smokeless tobacco: Never Used  Substance and Sexual Activity  . Alcohol use: No    Alcohol/week: 0.0 oz  . Drug use: No  . Sexual activity: Never  Lifestyle  . Physical activity:    Days per week: Not on file    Minutes per session: Not on file  . Stress: Not on file  Relationships  . Social connections:    Talks on phone: Not on file    Gets together: Not on file    Attends religious service: Not on file    Active member of club or organization: Not on file    Attends meetings of clubs or organizations: Not on file    Relationship status:  Widowed  Other Topics Concern  . Not on file  Social History Narrative  . Not on file    Outpatient Encounter Medications as of 12/10/2017  Medication Sig  . amLODipine-atorvastatin (CADUET) 5-10 MG tablet Take 1 tablet by mouth daily.  . hydrochlorothiazide (HYDRODIURIL) 12.5 MG tablet Take 1 tablet (12.5 mg total) by mouth daily.  Marland Kitchen latanoprost (XALATAN) 0.005 % ophthalmic solution    Facility-Administered Encounter Medications as of 12/10/2017  Medication  . betamethasone acetate-betamethasone sodium phosphate (CELESTONE) injection 3 mg    Activities of Daily Living In your present state of health, do you have any difficulty performing the following activities: 12/10/2017  Hearing? N  Vision? N  Difficulty concentrating or making decisions? N  Walking or climbing stairs? N  Dressing or bathing?  N  Doing errands, shopping? N  Preparing Food and eating ? N  Using the Toilet? N  In the past six months, have you accidently leaked urine? Y  Comment Managed with a daily brief/pad. Followed by Urology.   Do you have problems with loss of bowel control? N  Managing your Medications? N  Managing your Finances? N  Housekeeping or managing your Housekeeping? N  Some recent data might be hidden    Patient Care Team: Leone Haven, MD as PCP - General (Family Medicine)    Assessment:   This is a routine wellness examination for Lauren [name].  The goal of the wellness visit is to assist the patient how to close the gaps in care and create a preventative care plan for the patient.   The roster of all physicians providing medical care to patient is listed in the Snapshot section of the chart.  Osteoporosis reviewed.    Safety issues reviewed; Smoke and carbon monoxide detectors in the home. No firearms or firearms locked in a safe within the home. Wears seatbelts when driving or riding with others. No violence in the home.  They do not have excessive sun exposure.  Discussed the need for sun protection: hats, long sleeves and the use of sunscreen if there is significant sun exposure.  Patient is alert, normal appearance, oriented to person/place/and time.  Correctly identified the president of the [place] and recalls of 3/3 words. Performs simple calculations and can read correct time from watch face. Displays appropriate judgement.  No new identified risk were noted.  No failures at ADL's or IADL's.   BMI- discussed the importance of a healthy diet, water intake and the benefits of aerobic exercise. Educational material provided.   24 hour diet recall: Low carb diet  Dental- dentures.  Eye- Visual acuity not assessed per patient preference since they have regular follow up with the ophthalmologist.  Wears corrective lenses.  Prevnar 13 administered left deltoid, tolerated well.  Educational material provided.  HTN followed by PCP- last home reading 136/72 R, 135/70 L.  Health maintenance gaps- closed.  Patient Concerns: None at this time. Follow up with PCP as needed.  Exercise Activities and Dietary recommendations Current Exercise Habits: The patient does not participate in regular exercise at present  Goals    . Maintain Healthy Lifestyle     Stay active Stay hydrated  Healthy diet ( monitor chocolate intake)       Fall Risk Fall Risk  12/10/2017 07/09/2017 11/25/2016 08/30/2016 11/08/2015  Falls in the past year? No No No No No  Depression Screen PHQ 2/9 Scores 12/10/2017 07/09/2017 11/25/2016 08/30/2016  PHQ - 2 Score 0 0 0 0  Cognitive Function MMSE - Mini Mental State Exam 07/02/2017 11/25/2016 11/08/2015  Orientation to time 5 5 5   Orientation to Place 5 5 5   Registration 3 3 3   Attention/ Calculation 3 5 5   Recall 3 3 3   Language- name 2 objects 2 2 2   Language- repeat 1 1 1   Language- follow 3 step command 3 3 3   Language- read & follow direction 1 1 1   Write a sentence 1 1 1   Copy design 1 1 1   Total score 28 30 30      6CIT Screen 12/10/2017  What Year? 0 points  What month? 0 points  What time? 0 points  Count back from 20 0 points  Months in reverse 0 points  Repeat phrase 0 points  Total Score 0    Immunization History  Administered Date(s) Administered  . Pneumococcal Conjugate-13 12/10/2017  . Pneumococcal Polysaccharide-23 10/05/2015, 10/14/2016   Screening Tests Health Maintenance  Topic Date Due  . COLONOSCOPY  03/25/2018 (Originally 12/07/1992)  . INFLUENZA VACCINE  03/26/2018  . TETANUS/TDAP  08/31/2019  . DEXA SCAN  Completed  . PNA vac Low Risk Adult  Completed      Plan:    End of life planning; Advance aging; Advanced directives discussed. Copy of current HCPOA/Living Will requested.    I have personally reviewed and noted the following in the patient's chart:   . Medical and social history . Use of  alcohol, tobacco or illicit drugs  . Current medications and supplements . Functional ability and status . Nutritional status . Physical activity . Advanced directives . List of other physicians . Hospitalizations, surgeries, and ER visits in previous 12 months . Vitals . Screenings to include cognitive, depression, and falls . Referrals and appointments  In addition, I have reviewed and discussed with patient certain preventive protocols, quality metrics, and best practice recommendations. A written personalized care plan for preventive services as well as general preventive health recommendations were provided to patient.     Varney Biles, LPN  [id number]/[id number]

## 2017-12-24 ENCOUNTER — Telehealth: Payer: Self-pay | Admitting: Family Medicine

## 2017-12-24 NOTE — Telephone Encounter (Addendum)
Copied from [place] [id number]. Topic: Quick Communication - See Telephone Encounter >> Dec 24, 2017  8:38 AM Ivar Drape wrote: CRM for notification. See Telephone encounter for: 12/24/17. Patient is being woken up in the middle of the night because of her left foot.  Her toes are curled under and they are separated.  Then when she stands up her foot won't move. It takes about 3-4 minutes before she can get it going to walk.  The foot is also hurting pretty bad the patient stated.  All of this started last week, and it happens every night. Patient would like for Dr. Tamala Julian to advise.

## 2017-12-24 NOTE — Telephone Encounter (Signed)
Spoke with patient and placed her on schedule to see Dr. Tamala Julian tomorrow for further evaluation.

## 2017-12-25 ENCOUNTER — Ambulatory Visit (INDEPENDENT_AMBULATORY_CARE_PROVIDER_SITE_OTHER)
Admission: RE | Admit: 2017-12-25 | Discharge: 2017-12-25 | Disposition: A | Discharge status: Home / Self Care | Payer: PPO | Source: Ambulatory Visit | Attending: Family Medicine | Admitting: Family Medicine

## 2017-12-25 ENCOUNTER — Ambulatory Visit (INDEPENDENT_AMBULATORY_CARE_PROVIDER_SITE_OTHER): Payer: PPO | Admitting: Family Medicine

## 2017-12-25 ENCOUNTER — Encounter: Payer: Self-pay | Admitting: Family Medicine

## 2017-12-25 VITALS — BP 148/60 | HR 73 | Ht 64.0 in | Wt 151.0 lb

## 2017-12-25 DIAGNOSIS — M7989 Other specified soft tissue disorders: Secondary | ICD-10-CM | POA: Diagnosis not present

## 2017-12-25 DIAGNOSIS — M79672 Pain in left foot: Secondary | ICD-10-CM

## 2017-12-25 DIAGNOSIS — M79662 Pain in left lower leg: Secondary | ICD-10-CM

## 2017-12-25 MED ORDER — GABAPENTIN 100 MG PO CAPS: 100.0000 mg | ORAL_CAPSULE | Freq: Every day | ORAL | 3 refills | Status: DC

## 2017-12-25 NOTE — Patient Instructions (Addendum)
Good to see you  Alvera Singh is your friend.  Gabapentin 100mg  at night Iron 65mg  daily with 500mg  of vitamin C Drained the ankle today and see how it goes.  Xray downstairs See me again in 4 weeks

## 2017-12-25 NOTE — Assessment & Plan Note (Signed)
Injected anterior cyst.  Could be secondary to intra-articular.  X-rays pending.  Discussed risks and regimen and home exercise.  Discussed which activities to do which wants to avoid.  Patient is to increase activity as tolerated.  Patient will consider compression.  Follow-up with me again in 4 weeks

## 2017-12-25 NOTE — Progress Notes (Signed)
[name] [address] [place], [place] [address] Phone: [phone] Subjective:    I'm seeing this patient by the request  of:    CC: Foot pain and ankle pain follow-up  [id number]  Lauren [name] is a 77 y.o. female coming in with complaint of foot pain.  Patient was seen previously and did have more of a neuroma.  Feels that the numbness in that area did help out significantly.  Patient though unfortunately continues to have pain of the ankle itself.  Feels like she is having loss of range of motion.  Seems to be on the anterior aspect of the ankle.  Patient states that this is severe.    Past Medical History:  Diagnosis Date  . Allergic rhinitis   . Chickenpox   . Elevated LDL cholesterol level 11/26/2015  . Incontinence of urine in female 10/05/2015  . Migraines   . Urinary incontinence    Past Surgical History:  Procedure Laterality Date  . CHOLECYSTECTOMY  1995  . TONSILLECTOMY  1962   Social History   Socioeconomic History  . Marital status: Widowed    Spouse name: Not on file  . Number of children: Not on file  . Years of education: Not on file  . Highest education level: Not on file  Occupational History  . Not on file  Social Needs  . Financial resource strain: Not hard at all  . Food insecurity:    Worry: Never true    Inability: Never true  . Transportation needs:    Medical: No    Non-medical: No  Tobacco Use  . Smoking status: Former Research scientist (life sciences)  . Smokeless tobacco: Never Used  Substance and Sexual Activity  . Alcohol use: No    Alcohol/week: 0.0 oz  . Drug use: No  . Sexual activity: Never  Lifestyle  . Physical activity:    Days per week: Not on file    Minutes per session: Not on file  . Stress: Not on file  Relationships  . Social connections:    Talks on phone: Not on file    Gets together: Not on file    Attends religious service: Not on file    Active member of club or organization: Not on file   Attends meetings of clubs or organizations: Not on file    Relationship status: Widowed  Other Topics Concern  . Not on file  Social History Narrative  . Not on file   Allergies  Allergen Reactions  . Eggs Or Egg-Derived Products   . Hydrocodone-Guaifenesin Other (See Comments)    Cant sleep   . Influenza Vaccines Other (See Comments)    Nausea and vomiting and stomach pain for 10 weeks  . Mobic [Meloxicam] Swelling  . Oxybutynin     Memory loss per patient  . Sulfa Antibiotics Other (See Comments)    Cold sweats, passing out   . Codeine Rash    Cold sweats    Family History  Problem Relation Age of Onset  . Arthritis Unknown   . Heart disease Unknown   . Hypertension Unknown   . Hematuria Father      Past medical history, social, surgical and family history all reviewed in electronic medical record.  No pertanent information unless stated regarding to the chief complaint.   Review of Systems:Review of systems updated and as accurate as of 12/25/17  No headache, visual changes, nausea, vomiting, diarrhea, constipation, dizziness, abdominal pain, skin  rash, fevers, chills, night sweats, weight loss, swollen lymph nodes, body aches, joint swelling, muscle aches, chest pain, shortness of breath, mood changes.   Objective  Blood pressure (!) 148/60, pulse 73, height 5\' 4"  (1.626 m), weight 151 lb (68.5 kg), SpO2 94 %. Systems examined below as of 12/25/17   General: No apparent distress alert and oriented x3 mood and affect normal, dressed appropriately.  HEENT: Pupils equal, extraocular movements intact  Respiratory: Patient's speak in full sentences and does not appear short of breath  Cardiovascular: No lower extremity edema, non tender, no erythema  Skin: Warm dry intact with no signs of infection or rash on extremities or on axial skeleton.  Abdomen: Soft nontender  Neuro: Cranial nerves II through XII are intact, neurovascularly intact in all extremities with 2+ DTRs  and 2+ pulses.  Lymph: No lymphadenopathy of posterior or anterior cervical chain or axillae bilaterally.  Gait normal with good balance and coordination.  MSK:  Non tender with full range of motion and good stability and symmetric strength and tone of shoulders, elbows, wrist, hip, knee bilaterally.  Ankle: Left Mild swelling of the anterior lateral joint Range of motion is full in all directions.  Patient though does have positive impingement on the anterior aspect. Strength is 5/5 in all directions. Stable lateral and medial ligaments; squeeze test and kleiger test unremarkable; Talar dome nontender; No pain at base of 5th MT; No tenderness over cuboid; No tenderness over N spot or navicular prominence No tenderness on posterior aspects of lateral and medial malleolus No sign of peroneal tendon subluxations or tenderness to palpation Negative tarsal tunnel tinel's Able to walk 4 steps.  Procedure: Real-time Ultrasound Guided Injection of left anterior cyst of the ankle Device: GE Logiq Q7 Ultrasound guided injection is preferred based studies that show increased duration, increased effect, greater accuracy, decreased procedural pain, increased response rate, and decreased cost with ultrasound guided versus blind injection.  Verbal informed consent obtained.  Time-out conducted.  Noted no overlying erythema, induration, or other signs of local infection.  Skin prepped in a sterile fashion.  Local anesthesia: Topical Ethyl chloride.  With sterile technique and under real time ultrasound guidance: With a 18-gauge 1-1/2 inch needle patient was injected with 0.5 cc of 0.5% Marcaine and aspirated 6 cc of strawlike color fluid then injected with 0.5 cc of Kenalog 40 mg/mL Completed without difficulty  Pain immediately resolved suggesting accurate placement of the medication.  Advised to call if fevers/chills, erythema, induration, drainage, or persistent bleeding.  Images permanently stored  and available for review in the ultrasound unit.  Impression: Technically successful ultrasound guided injection.   Impression and Recommendations:     This case required medical decision making of moderate complexity.      Note: This dictation was prepared with Dragon dictation along with smaller phrase technology. Any transcriptional errors that result from this process are unintentional.

## 2018-01-14 ENCOUNTER — Ambulatory Visit: Payer: Self-pay

## 2018-01-14 ENCOUNTER — Encounter: Payer: Self-pay | Admitting: Family Medicine

## 2018-01-14 ENCOUNTER — Ambulatory Visit (INDEPENDENT_AMBULATORY_CARE_PROVIDER_SITE_OTHER): Payer: PPO | Admitting: Family Medicine

## 2018-01-14 VITALS — BP 140/64 | HR 76 | Ht 64.0 in | Wt 151.0 lb

## 2018-01-14 DIAGNOSIS — M79672 Pain in left foot: Secondary | ICD-10-CM | POA: Diagnosis not present

## 2018-01-14 DIAGNOSIS — M216X2 Other acquired deformities of left foot: Secondary | ICD-10-CM

## 2018-01-14 MED ORDER — VITAMIN D (ERGOCALCIFEROL) 1.25 MG (50000 UNIT) PO CAPS: 50000.0000 [IU] | ORAL_CAPSULE | ORAL | 0 refills | Status: DC

## 2018-01-14 MED ORDER — DOXYCYCLINE HYCLATE 100 MG PO TABS: 100.0000 mg | ORAL_TABLET | Freq: Two times a day (BID) | ORAL | 0 refills | Status: AC

## 2018-01-14 NOTE — Patient Instructions (Addendum)
I am sorry you are still hurting I am treting you for possible stress fracture and possible superficial phlebitis.  We will do a once weekly vitamin D that I think will help but will take 1-2 weeks to notice 2 baby aspirin daily  Also doxycycline 1 pill 2 times a day for next week incase the vein is involved.  See me again in 3 weeks

## 2018-01-14 NOTE — Progress Notes (Signed)
[name] [address] [place], [place] [address] Phone: [phone] Subjective:     CC: Left foot pain  DPO:[id number]  Lauren [name] is a 77 y.o. female coming in with complaint of left foot pain.  Patient has had this for some time.  Has had a neuroma.  Also had a cyst of the anterior ankle.  Had aspiration of this and states that the ankle is feeling good with increasing range of motion.  Patient has been walking on a more regular basis and has noticed though that unfortunately has noticed that the ball of her foot.      Past Medical History:  Diagnosis Date  . Allergic rhinitis   . Chickenpox   . Elevated LDL cholesterol level 11/26/2015  . Incontinence of urine in female 10/05/2015  . Migraines   . Urinary incontinence    Past Surgical History:  Procedure Laterality Date  . CHOLECYSTECTOMY  1995  . TONSILLECTOMY  1962   Social History   Socioeconomic History  . Marital status: Widowed    Spouse name: Not on file  . Number of children: Not on file  . Years of education: Not on file  . Highest education level: Not on file  Occupational History  . Not on file  Social Needs  . Financial resource strain: Not hard at all  . Food insecurity:    Worry: Never true    Inability: Never true  . Transportation needs:    Medical: No    Non-medical: No  Tobacco Use  . Smoking status: Former Research scientist (life sciences)  . Smokeless tobacco: Never Used  Substance and Sexual Activity  . Alcohol use: No    Alcohol/week: 0.0 oz  . Drug use: No  . Sexual activity: Never  Lifestyle  . Physical activity:    Days per week: Not on file    Minutes per session: Not on file  . Stress: Not on file  Relationships  . Social connections:    Talks on phone: Not on file    Gets together: Not on file    Attends religious service: Not on file    Active member of club or organization: Not on file    Attends meetings of clubs or organizations: Not on file    Relationship  status: Widowed  Other Topics Concern  . Not on file  Social History Narrative  . Not on file   Allergies  Allergen Reactions  . Eggs Or Egg-Derived Products   . Hydrocodone-Guaifenesin Other (See Comments)    Cant sleep   . Influenza Vaccines Other (See Comments)    Nausea and vomiting and stomach pain for 10 weeks  . Mobic [Meloxicam] Swelling  . Oxybutynin     Memory loss per patient  . Sulfa Antibiotics Other (See Comments)    Cold sweats, passing out   . Codeine Rash    Cold sweats    Family History  Problem Relation Age of Onset  . Arthritis Unknown   . Heart disease Unknown   . Hypertension Unknown   . Hematuria Father      Past medical history, social, surgical and family history all reviewed in electronic medical record.  No pertanent information unless stated regarding to the chief complaint.   Review of Systems:Review of systems updated and as accurate as of 01/14/18  No headache, visual changes, nausea, vomiting, diarrhea, constipation, dizziness, abdominal pain, skin rash, fevers, chills, night sweats, weight loss, swollen lymph nodes,  body aches, joint swelling, muscle aches, chest pain, shortness of breath, mood changes.   Objective  Blood pressure 140/64, pulse 76, height 5\' 4"  (1.626 m), weight 151 lb (68.5 kg), SpO2 97 %. Systems examined below as of 01/14/18   General: No apparent distress alert and oriented x3 mood and affect normal, dressed appropriately.  HEENT: Pupils equal, extraocular movements intact  Respiratory: Patient's speak in full sentences and does not appear short of breath  Cardiovascular: No lower extremity edema, non tender, no erythema  Skin: Warm dry intact with no signs of infection or rash on extremities or on axial skeleton.  Abdomen: Soft nontender  Neuro: Cranial nerves II through XII are intact, neurovascularly intact in all extremities with 2+ DTRs and 2+ pulses.  Lymph: No lymphadenopathy of posterior or anterior cervical  chain or axillae bilaterally.  Gait normal with good balance and coordination.  MSK:  Non tender with full range of motion and good stability and symmetric strength and tone of shoulders, elbows, wrist, hip, knee bilaterally.   Ankle: Left No visible erythema or swelling. Range of motion is full in all directions. Strength is 5/5 in all directions. Stable lateral and medial ligaments; squeeze test and kleiger test unremarkable; Talar dome nontender; No pain at base of 5th MT; No tenderness over cuboid; No tenderness over N spot or navicular prominence No tenderness on posterior aspects of lateral and medial malleolus No sign of peroneal tendon subluxations or tenderness to palpation Negative tarsal tunnel tinel's Able to walk 4 steps.  Contralateral ankle unremarkable  Foot exam shows rigid midfoot. Mild positive compression of the foot forefoot.  Mild pain over the dorsum of the foot between the 4/5th.   Ltd msk of the foot preformed and interpreted by Lyndal Pulley  Limited ultrasound of patient's foot shows that there is abnormality of the vein and artery on the dorsal aspect of the foot between the third and fourth metatarsals.  No neuroma though noted.  Unable to see any true cortical defect noted. Impression: Abnormality of the vascularity of the dorsum of the foot       Impression and Recommendations:     This case required medical decision making of moderate complexity.      Note: This dictation was prepared with Dragon dictation along with smaller phrase technology. Any transcriptional errors that result from this process are unintentional.

## 2018-01-14 NOTE — Assessment & Plan Note (Signed)
Foot pain.  Abnormality vascularity.

## 2018-01-14 NOTE — Assessment & Plan Note (Signed)
Left foot pain.  Could be a potential stress reaction.  Patient on ultrasound does not have any true cortical defect.  No neuroma noted either.  Does have an atypical vascular abnormality noted between the third and fourth metatarsals.  Patient is going to try to take gabapentin on a more regular basis.  We discussed avoiding certain activities.  Patient will follow-up again in 4 to 8 weeks

## 2018-01-15 ENCOUNTER — Telehealth: Payer: Self-pay | Admitting: Family Medicine

## 2018-01-15 NOTE — Telephone Encounter (Signed)
I think better coverage and should help  It is not like sulfa meds But up to her

## 2018-01-15 NOTE — Telephone Encounter (Signed)
Discussed with pt

## 2018-01-15 NOTE — Telephone Encounter (Signed)
Copied from [place] [id number]. Topic: General - Other >> Jan 15, 2018  9:05 AM Margot Ables wrote: Pt called stating that she is scared to death to take the medications prescribed by Dr. Tamala Julian due to the potential side effects. Pt states that she's had allergic reactions to several medications in the past (listed oxybutin and memory loss, gabapentin kept her awake). She said she cannot take anything with sulfur. She said that the "rare" stuff is always what hits her. Pt is asking for call back with advice.

## 2018-01-21 DIAGNOSIS — N3946 Mixed incontinence: Secondary | ICD-10-CM | POA: Diagnosis not present

## 2018-01-21 DIAGNOSIS — R350 Frequency of micturition: Secondary | ICD-10-CM | POA: Diagnosis not present

## 2018-01-22 ENCOUNTER — Other Ambulatory Visit: Payer: Self-pay | Admitting: Urology

## 2018-01-26 ENCOUNTER — Encounter: Payer: Self-pay | Admitting: Urology

## 2018-01-26 ENCOUNTER — Ambulatory Visit: Payer: PPO | Admitting: Urology

## 2018-01-26 VITALS — BP 132/65 | HR 77 | Ht 64.0 in | Wt 141.0 lb

## 2018-01-26 DIAGNOSIS — N3946 Mixed incontinence: Secondary | ICD-10-CM

## 2018-01-26 NOTE — Progress Notes (Signed)
01/26/2018 8:30 AM   Lauren [name] [birth year] [id number]  Referring provider: Leone Haven, MD [address] [place], [place] [address]  No chief complaint on file.   HPI: The patient is a 77 year old woman who has worsening incontinence over 1 year. She says she does not leak with stress maneuvers or with urgency. She leaks a small amount not associated with awareness during activity levels. She has a little bit of dampness some nights. She wears 3 pads per day moderately wet.  On the patient's last visit she had a small grade 2 cystocele and a double suburethral swelling noted. I felt that she had leakage is not associated awareness and mild enuresis and my index of suspicion is low that she had a diverticulum. I did order an MRI and this was normal I was going to start medication empirically and only order urodynamics if she does not reach her treatment goal. MRI was normal  the patient still leaks a little bit but she thinks she 75% better. She had dry mouth and constipation I believe from [place] and [employer]. Partial responder to oxybutynin The beta 3 agonist did not help. She had confusion with oxybutynin.  She may be having an x-ray or procedure of her carotid artery.  She needs a fine-needle biopsy of her breast lesion.  She would like to proceed with urodynamic  Today Incontinence stable frequency stable On urodynamics prior to the study she did not void and was catheterized for 100 mL.  Maximum bladder capacity was 380 mL.  She had sensory urgency.  She was having low pressure bladder instability.  The pressure reached 4 cm water associated with moderate to severe leakage.  She expressed pressure.  She did trigger instability with Valsalva maneuvers.  At 150 mL her cough leak point pressure was 83 cm water associated with moderate leakage.  At 250 mL her cough leak point pressure was 56 cm water with moderate leakage.  It was as low as 37 cmH2O 325 mL during  voluntary voiding she voided 151 mL with a maximal flow with 30 mils per second.  Maximum voiding pressure was 3 cm water.  Residual was 226 mL.  Bladder neck descent at 1 to 2 cm.  EMG activity increased during the voiding phase most of the patient's instability was triggered by Valsalva maneuvers.  The details of the urodynamics are signed and dictated  The patient likely has leakage not associated with awareness due to bladder overactivity and this would be the cause of her enuresis.  Having said that she does have moderately low leak point pressures.  I would feel uncomfortable recommending a sling.  Again she denies stress incontinence.  I thought it was best to go through the 3 refractory therapies with my templates.  This was done in detail.  She would like to try InterStim and she also is interested in percutaneous tibial nerve stimulation.   PMH: Past Medical History:  Diagnosis Date  . Allergic rhinitis   . Chickenpox   . Elevated LDL cholesterol level 11/26/2015  . Incontinence of urine in female 10/05/2015  . Migraines   . Urinary incontinence     Surgical History: Past Surgical History:  Procedure Laterality Date  . CHOLECYSTECTOMY  1995  . TONSILLECTOMY  1962    Home Medications:  Allergies as of 01/26/2018      Reactions   Eggs Or Egg-derived Products    Hydrocodone-guaifenesin Other (See Comments)   Cant sleep  Influenza Vaccines Other (See Comments)   Nausea and vomiting and stomach pain for 10 weeks   Mobic [meloxicam] Swelling   Oxybutynin    Memory loss per patient   Sulfa Antibiotics Other (See Comments)   Cold sweats, passing out    Codeine Rash   Cold sweats       Medication List        Accurate as of 01/26/18  8:30 AM. Always use your most recent med list.          amLODipine-atorvastatin 5-10 MG tablet Commonly known as:  CADUET Take 1 tablet by mouth daily.   gabapentin 100 MG capsule Commonly known as:  NEURONTIN Take 1 capsule (100 mg total)  by mouth at bedtime.   hydrochlorothiazide 12.5 MG tablet Commonly known as:  HYDRODIURIL Take 1 tablet (12.5 mg total) by mouth daily.   latanoprost 0.005 % ophthalmic solution Commonly known as:  XALATAN   Vitamin D (Ergocalciferol) 50000 units Caps capsule Commonly known as:  DRISDOL Take 1 capsule (50,000 Units total) by mouth every 7 (seven) days.       Allergies:  Allergies  Allergen Reactions  . Eggs Or Egg-Derived Products   . Hydrocodone-Guaifenesin Other (See Comments)    Cant sleep   . Influenza Vaccines Other (See Comments)    Nausea and vomiting and stomach pain for 10 weeks  . Mobic [Meloxicam] Swelling  . Oxybutynin     Memory loss per patient  . Sulfa Antibiotics Other (See Comments)    Cold sweats, passing out   . Codeine Rash    Cold sweats     Family History: Family History  Problem Relation Age of Onset  . Arthritis Unknown   . Heart disease Unknown   . Hypertension Unknown   . Hematuria Father     Social History:  reports that she has quit smoking. She has never used smokeless tobacco. She reports that she does not drink alcohol or use drugs.  ROS:                                        Physical Exam: There were no vitals taken for this visit.  Constitutional:  Alert and oriented, No acute distress.  Laboratory Data: No results found for: WBC, HGB, HCT, MCV, PLT  Lab Results  Component Value Date   CREATININE 0.87 10/13/2017    No results found for: PSA  No results found for: TESTOSTERONE  No results found for: HGBA1C  Urinalysis    Component Value Date/Time   APPEARANCEUR Clear 11/03/2015 1048   GLUCOSEU Negative 11/03/2015 1048   BILIRUBINUR Negative 11/03/2015 1048   PROTEINUR Negative 11/03/2015 1048   NITRITE Negative 11/03/2015 1048   LEUKOCYTESUR Negative 11/03/2015 1048    Pertinent Imaging:   Assessment & Plan: We will call for PNE in [place].  She responds I will place the  InterStim device in [place].  She never tried the Myrbetriq because of the cost.  Her response to antimuscarinics in the past for some supports bladder overactivity as the treatment path  There are no diagnoses linked to this encounter.  No follow-ups on file.  Reece Packer, MD  [facility] [address], [place], [place] [address] [phone]

## 2018-02-04 ENCOUNTER — Encounter: Payer: Self-pay | Admitting: Family Medicine

## 2018-02-04 ENCOUNTER — Ambulatory Visit (INDEPENDENT_AMBULATORY_CARE_PROVIDER_SITE_OTHER): Payer: PPO | Admitting: Family Medicine

## 2018-02-04 VITALS — BP 162/92 | HR 67 | Ht 64.0 in | Wt 152.0 lb

## 2018-02-04 DIAGNOSIS — M79672 Pain in left foot: Secondary | ICD-10-CM

## 2018-02-04 NOTE — Patient Instructions (Signed)
Good to see you  Mri of left foot we will see what is going on  Call [phone] if you want to speed it up  Depending on results we will discuss next steps

## 2018-02-04 NOTE — Assessment & Plan Note (Signed)
Patient has had significant difficulty.  Loss of transverse arch.  Concern for potential occult fracture.  Vascular neurologic could be a possibility as well.  Did not respond well to the neuromas previously either.  We will get an MRI to further evaluate.  This could change medical management.  Patient has failed all conservative therapy including 6 weeks of formal physical therapy and pain has been greater than 6 months.

## 2018-02-04 NOTE — Progress Notes (Signed)
[name] [address] [place], [place] [address] Phone: [phone] Subjective:     CC: Foot pain  YOV:[id number]  Lauren [name] is a 77 y.o. female coming in with complaint of foot pain. She feels that the pain in her foot has gotten worse. She took the doxycycline and could not sleep. She said that she developed pain in her legs and back that was unbearable. She is having continued pain in between the 2nd and 3rd toes as well.   Patient states that this is very frustrated.  Sometimes severe with a significant amount of swelling that keeps her from activity.  Patient states starting to affect daily activities on a more regular basis that has been as well.  Not responding to the therapies we have attempted previously.     Past Medical History:  Diagnosis Date  . Allergic rhinitis   . Chickenpox   . Elevated LDL cholesterol level 11/26/2015  . Incontinence of urine in female 10/05/2015  . Migraines   . Urinary incontinence    Past Surgical History:  Procedure Laterality Date  . CHOLECYSTECTOMY  1995  . TONSILLECTOMY  1962   Social History   Socioeconomic History  . Marital status: Widowed    Spouse name: Not on file  . Number of children: Not on file  . Years of education: Not on file  . Highest education level: Not on file  Occupational History  . Not on file  Social Needs  . Financial resource strain: Not hard at all  . Food insecurity:    Worry: Never true    Inability: Never true  . Transportation needs:    Medical: No    Non-medical: No  Tobacco Use  . Smoking status: Former Research scientist (life sciences)  . Smokeless tobacco: Never Used  Substance and Sexual Activity  . Alcohol use: No    Alcohol/week: 0.0 oz  . Drug use: No  . Sexual activity: Never  Lifestyle  . Physical activity:    Days per week: Not on file    Minutes per session: Not on file  . Stress: Not on file  Relationships  . Social connections:    Talks on phone: Not on file   Gets together: Not on file    Attends religious service: Not on file    Active member of club or organization: Not on file    Attends meetings of clubs or organizations: Not on file    Relationship status: Widowed  Other Topics Concern  . Not on file  Social History Narrative  . Not on file   Allergies  Allergen Reactions  . Eggs Or Egg-Derived Products   . Hydrocodone-Guaifenesin Other (See Comments)    Cant sleep   . Influenza Vaccines Other (See Comments)    Nausea and vomiting and stomach pain for 10 weeks  . Mobic [Meloxicam] Swelling  . Oxybutynin     Memory loss per patient  . Sulfa Antibiotics Other (See Comments)    Cold sweats, passing out   . Codeine Rash    Cold sweats    Family History  Problem Relation Age of Onset  . Arthritis Unknown   . Heart disease Unknown   . Hypertension Unknown   . Hematuria Father      Past medical history, social, surgical and family history all reviewed in electronic medical record.  No pertanent information unless stated regarding to the chief complaint.   Review of Systems:Review of systems updated  and as accurate as of 02/04/18  No headache, visual changes, nausea, vomiting, diarrhea, constipation, dizziness, abdominal pain, skin rash, fevers, chills, night sweats, weight loss, swollen lymph nodes, body aches,  chest pain, shortness of breath, mood changes.  Positive muscle aches and joint swelling  Objective  Blood pressure (!) 162/92, pulse 67, height 5\' 4"  (1.626 m), weight 152 lb (68.9 kg), SpO2 90 %. Systems examined below as of 02/04/18   General: No apparent distress alert and oriented x3 mood and affect normal, dressed appropriately.  HEENT: Pupils equal, extraocular movements intact  Respiratory: Patient's speak in full sentences and does not appear short of breath  Cardiovascular: No lower extremity edema, non tender, no erythema  Skin: Warm dry intact with no signs of infection or rash on extremities or on axial  skeleton.  Abdomen: Soft nontender  Neuro: Cranial nerves II through XII are intact, neurovascularly intact in all extremities with 2+ DTRs and 2+ pulses.  Lymph: No lymphadenopathy of posterior or anterior cervical chain or axillae bilaterally.  Gait antalgic MSK:  Non tender with full range of motion and good stability and symmetric strength and tone of shoulders, elbows, wrist, hip, knee and ankles bilaterally.  Mild to moderate arthritic changes of multiple joints  Left foot exam shows the patient actually has some increasing swelling of the left ankle compared to the contralateral side.  Patient has negative Thompson test and no pain with compression of the calf.  Patient does dorsalis pedis pulses intact and symmetric to the contralateral side but patient does have some increasing bluish hue of the toes.  Increasing discomfort over the second and third toes as well. ]    Impression and Recommendations:     This case required medical decision making of moderate complexity.      Note: This dictation was prepared with Dragon dictation along with smaller phrase technology. Any transcriptional errors that result from this process are unintentional.

## 2018-02-07 ENCOUNTER — Ambulatory Visit
Admission: RE | Admit: 2018-02-07 | Discharge: 2018-02-07 | Disposition: A | Discharge status: Home / Self Care | Payer: PPO | Source: Ambulatory Visit | Attending: Family Medicine | Admitting: Family Medicine

## 2018-02-07 DIAGNOSIS — M79672 Pain in left foot: Secondary | ICD-10-CM

## 2018-02-07 DIAGNOSIS — R600 Localized edema: Secondary | ICD-10-CM | POA: Diagnosis not present

## 2018-02-16 ENCOUNTER — Encounter: Payer: Self-pay | Admitting: Family Medicine

## 2018-03-04 ENCOUNTER — Other Ambulatory Visit: Payer: Self-pay | Admitting: Family Medicine

## 2018-03-04 ENCOUNTER — Ambulatory Visit (INDEPENDENT_AMBULATORY_CARE_PROVIDER_SITE_OTHER): Payer: PPO | Admitting: Family Medicine

## 2018-03-04 ENCOUNTER — Telehealth: Payer: Self-pay

## 2018-03-04 ENCOUNTER — Encounter: Payer: Self-pay | Admitting: Family Medicine

## 2018-03-04 DIAGNOSIS — R32 Unspecified urinary incontinence: Secondary | ICD-10-CM | POA: Diagnosis not present

## 2018-03-04 DIAGNOSIS — I10 Essential (primary) hypertension: Secondary | ICD-10-CM

## 2018-03-04 DIAGNOSIS — E663 Overweight: Secondary | ICD-10-CM | POA: Diagnosis not present

## 2018-03-04 DIAGNOSIS — G5762 Lesion of plantar nerve, left lower limb: Secondary | ICD-10-CM

## 2018-03-04 NOTE — Telephone Encounter (Signed)
fyi

## 2018-03-04 NOTE — Assessment & Plan Note (Signed)
She notes continued mild discomfort.  She will continue to see sports medicine.

## 2018-03-04 NOTE — Patient Instructions (Addendum)
Nice to see you. Please continue your blood pressure medicine.  Please look at it when you get home to make sure that there is not atorvastatin in it.  If there is please let us know.   Please continue to work on diet and exercise.

## 2018-03-04 NOTE — Telephone Encounter (Signed)
Noted  

## 2018-03-04 NOTE — Progress Notes (Signed)
  Tommi Rumps, MD Phone: [phone]  Lauren [name] is a 77 y.o. female who presents today for f/u.  CC: htn, overweight, morton neuroma, urine incotinence  HYPERTENSION  Disease Monitoring  Home BP Monitoring [id number] systolic Chest pain- no    Dyspnea- no Medications  Compliance-  Taking amlodipine 5 mg  Edema- no  Overweight: She has been working on diet and exercise.  She walks some.  She has cut out chocolate for the most part.  She gave up sugar.  She is down 4 pants sizes.  Morton neuroma: She has seen sports medicine for this. They talked about a fusion of her ankle given her ankle pain and foot pain.  She has had multiple injections.  She does not want anything further done at this time.  Urine incontinence: She is seeing urology.  They are sending her for urodynamics.  She notes just leaks urine at times.  Talked about bladder stimulator as well as Botox.  Social History   Tobacco Use  Smoking Status Former Smoker  Smokeless Tobacco Never Used     ROS see history of present illness  Objective  Physical Exam Vitals:   03/04/18 0906  BP: 124/70  Pulse: 69  Temp: 98 F (36.7 C)  SpO2: 98%    BP Readings from Last 3 Encounters:  03/04/18 124/70  02/04/18 (!) 162/92  01/26/18 132/65   Wt Readings from Last 3 Encounters:  03/04/18 150 lb 3.2 oz (68.1 kg)  02/04/18 152 lb (68.9 kg)  01/26/18 141 lb (64 kg)    Physical Exam  Constitutional: No distress.  Cardiovascular: Normal rate, regular rhythm and normal heart sounds.  Pulmonary/Chest: Effort normal and breath sounds normal.  Musculoskeletal: She exhibits no edema.  Neurological: She is alert.  Skin: Skin is warm and dry. She is not diaphoretic.     Assessment/Plan: Please see individual problem list.  Morton's neuroma of third interspace of left foot She notes continued mild discomfort.  She will continue to see sports medicine.  HTN (hypertension) Well-controlled.  Continue current  regimen.  Overweight Relatively stable over the last month though she reports she is down for pant sizes.  Congratulated her on this.  She will continue to monitor diet and exercise.  Incontinence of urine in female She will complete testing through urology.   No orders of the defined types were placed in this encounter.   No orders of the defined types were placed in this encounter.    Tommi Rumps, MD [place]

## 2018-03-04 NOTE — Telephone Encounter (Signed)
Copied from [place] [phone]. Topic: General - Other >> Mar 04, 2018 11:28 AM Keene Breath wrote: Reason for CRM: Patient called to leave a message for Dr. Caryl Bis regarding her medication.  She stated that she has not been taking the amLODipine-atorvastatin (CADUET) 5-10 MG tablet, which apparently she said the doctor did not want her to take.   She has been taking the amLODipine (NORVASC) 5 MG tablet which the doctor said she should take.  She just wanted to clarify it to the doctor.  CB#[phone].

## 2018-03-04 NOTE — Assessment & Plan Note (Signed)
Well-controlled.  Continue current regimen. 

## 2018-03-04 NOTE — Telephone Encounter (Signed)
Last Ov 03/04/18

## 2018-03-04 NOTE — Assessment & Plan Note (Signed)
Relatively stable over the last month though she reports she is down for pant sizes.  Congratulated her on this.  She will continue to monitor diet and exercise.

## 2018-03-04 NOTE — Assessment & Plan Note (Signed)
She will complete testing through urology.

## 2018-03-16 DIAGNOSIS — H26493 Other secondary cataract, bilateral: Secondary | ICD-10-CM | POA: Diagnosis not present

## 2018-03-18 DIAGNOSIS — R350 Frequency of micturition: Secondary | ICD-10-CM | POA: Diagnosis not present

## 2018-03-18 DIAGNOSIS — N3946 Mixed incontinence: Secondary | ICD-10-CM | POA: Diagnosis not present

## 2018-03-24 DIAGNOSIS — R350 Frequency of micturition: Secondary | ICD-10-CM | POA: Diagnosis not present

## 2018-03-24 DIAGNOSIS — N3946 Mixed incontinence: Secondary | ICD-10-CM | POA: Diagnosis not present

## 2018-03-30 ENCOUNTER — Ambulatory Visit: Payer: PPO | Admitting: Family Medicine

## 2018-04-03 ENCOUNTER — Encounter: Payer: Self-pay | Admitting: Family Medicine

## 2018-04-03 ENCOUNTER — Ambulatory Visit (INDEPENDENT_AMBULATORY_CARE_PROVIDER_SITE_OTHER): Payer: PPO | Admitting: Family Medicine

## 2018-04-03 ENCOUNTER — Ambulatory Visit: Payer: PPO | Admitting: Family Medicine

## 2018-04-03 VITALS — BP 142/60 | HR 80 | Temp 98.3°F | Resp 16 | Ht 64.0 in | Wt 147.5 lb

## 2018-04-03 DIAGNOSIS — H401132 Primary open-angle glaucoma, bilateral, moderate stage: Secondary | ICD-10-CM | POA: Diagnosis not present

## 2018-04-03 DIAGNOSIS — H26492 Other secondary cataract, left eye: Secondary | ICD-10-CM | POA: Diagnosis not present

## 2018-04-03 DIAGNOSIS — H9201 Otalgia, right ear: Secondary | ICD-10-CM | POA: Diagnosis not present

## 2018-04-03 DIAGNOSIS — H66001 Acute suppurative otitis media without spontaneous rupture of ear drum, right ear: Secondary | ICD-10-CM | POA: Diagnosis not present

## 2018-04-03 DIAGNOSIS — H26493 Other secondary cataract, bilateral: Secondary | ICD-10-CM | POA: Diagnosis not present

## 2018-04-03 DIAGNOSIS — Z961 Presence of intraocular lens: Secondary | ICD-10-CM | POA: Diagnosis not present

## 2018-04-03 DIAGNOSIS — H18413 Arcus senilis, bilateral: Secondary | ICD-10-CM | POA: Diagnosis not present

## 2018-04-03 MED ORDER — AMOXICILLIN 875 MG PO TABS: 875.0000 mg | ORAL_TABLET | Freq: Two times a day (BID) | ORAL | 0 refills | Status: DC

## 2018-04-03 MED ORDER — SALINE SPRAY 0.65 % NA SOLN: 1.0000 | NASAL | 0 refills | Status: DC | PRN

## 2018-04-03 NOTE — Progress Notes (Signed)
   Subjective:    Patient ID: Lauren [name], female    DOB: [birth year], 77 y.o.   MRN: [id number]  HPI  Patient presents to clinic complaining of right ear pain for past 6 days.  States she began to notice pain while in church and cold air was blowing in the building.  States pain in her right ear is gotten worse throughout the week.  Denies fever chills.  Denies nasal congestion or postnasal drip.  Patient does not take any over-the-counter allergy medication.  Patient Active Problem List   Diagnosis Date Noted  . Overweight 03/04/2018  . Low serum vitamin B12 10/13/2017  . Morton's neuroma of third interspace of left foot 09/22/2017  . Loss of transverse plantar arch of left foot 09/22/2017  . Pain and swelling of left lower leg 08/20/2017  . Memory deficit 07/02/2017  . HTN (hypertension) 07/02/2017  . Dry skin 08/31/2016  . History of shingles 08/31/2016  . Left foot pain 05/24/2016  . Osteoporosis 02/21/2016  . Elevated LDL cholesterol level 11/26/2015  . Incontinence of urine in female 10/05/2015   Social History   Tobacco Use  . Smoking status: Former Research scientist (life sciences)  . Smokeless tobacco: Never Used  Substance Use Topics  . Alcohol use: No    Alcohol/week: 0.0 standard drinks   Review of Systems  Constitutional: Negative for chills, diaphoresis and fever.  HENT: Positive for ear pain. Negative for congestion, postnasal drip, sinus pain and sore throat.   Eyes: Negative for pain, discharge and itching.  Respiratory: Negative for cough, shortness of breath and wheezing.   Cardiovascular: Negative for chest pain and palpitations.  Gastrointestinal: Negative.   Genitourinary: Negative.   Skin: Negative for color change, pallor and rash.  Neurological: Negative for dizziness, syncope, light-headedness and headaches.      Objective:   Physical Exam  Constitutional: She is oriented to person, place, and time. She appears well-developed and well-nourished. No distress.  HENT:    Head: Normocephalic and atraumatic.  Right Ear: External ear normal. There is tenderness. Tympanic membrane is erythematous and bulging. A middle ear effusion is present.  Left Ear: Tympanic membrane and external ear normal.  Eyes: Conjunctivae and EOM are normal. Right eye exhibits no discharge. Left eye exhibits no discharge. No scleral icterus.  Neck: Neck supple. No tracheal deviation present.  Cardiovascular: Normal rate, regular rhythm and normal heart sounds.  Pulmonary/Chest: Effort normal and breath sounds normal. No respiratory distress. She has no wheezes. She has no rales.  Neurological: She is alert and oriented to person, place, and time.  Skin: Skin is warm and dry. No pallor.  Psychiatric: She has a normal mood and affect. Her behavior is normal.  Nursing note and vitals reviewed.     Vitals:   04/03/18 0952  BP: (!) 142/60  Pulse: 80  Resp: 16  Temp: 98.3 F (36.8 C)  SpO2: 98%   Assessment & Plan:   Otitis media - Amoxicillin BID for 10 days. Increase fluid intake.  Ear pain - Saline nasal spray to help open up Ear, Nose, Throat. Can also use tylenol as needed for pain.    Follow up if symptoms do not improve or worsen

## 2018-04-03 NOTE — Patient Instructions (Signed)
Great to meet you!  Increase fluids, use saline nasal spray as needed, be sure to finish full Amoxicillin course. You can eat a yogurt daily with antibiotics to help replace good bacteria.

## 2018-04-13 ENCOUNTER — Ambulatory Visit (INDEPENDENT_AMBULATORY_CARE_PROVIDER_SITE_OTHER): Payer: PPO | Admitting: Family Medicine

## 2018-04-13 ENCOUNTER — Telehealth: Payer: Self-pay

## 2018-04-13 ENCOUNTER — Encounter: Payer: Self-pay | Admitting: Family Medicine

## 2018-04-13 VITALS — BP 118/62 | HR 75 | Temp 98.0°F | Resp 18 | Ht 64.0 in | Wt 149.1 lb

## 2018-04-13 DIAGNOSIS — E7800 Pure hypercholesterolemia, unspecified: Secondary | ICD-10-CM | POA: Diagnosis not present

## 2018-04-13 DIAGNOSIS — Z9849 Cataract extraction status, unspecified eye: Secondary | ICD-10-CM | POA: Insufficient documentation

## 2018-04-13 DIAGNOSIS — Z1212 Encounter for screening for malignant neoplasm of rectum: Secondary | ICD-10-CM

## 2018-04-13 DIAGNOSIS — H66001 Acute suppurative otitis media without spontaneous rupture of ear drum, right ear: Secondary | ICD-10-CM | POA: Diagnosis not present

## 2018-04-13 DIAGNOSIS — R32 Unspecified urinary incontinence: Secondary | ICD-10-CM | POA: Diagnosis not present

## 2018-04-13 DIAGNOSIS — I10 Essential (primary) hypertension: Secondary | ICD-10-CM

## 2018-04-13 DIAGNOSIS — Z1211 Encounter for screening for malignant neoplasm of colon: Secondary | ICD-10-CM

## 2018-04-13 DIAGNOSIS — H6690 Otitis media, unspecified, unspecified ear: Secondary | ICD-10-CM | POA: Insufficient documentation

## 2018-04-13 LAB — COMPREHENSIVE METABOLIC PANEL
ALK PHOS: 53 U/L (ref 39–117)
ALT: 10 U/L (ref 0–35)
AST: 16 U/L (ref 0–37)
Albumin: 4.2 g/dL (ref 3.5–5.2)
BILIRUBIN TOTAL: 0.5 mg/dL (ref 0.2–1.2)
BUN: 19 mg/dL (ref 6–23)
CO2: 30 mEq/L (ref 19–32)
Calcium: 9.7 mg/dL (ref 8.4–10.5)
Chloride: 106 mEq/L (ref 96–112)
Creatinine, Ser: 0.98 mg/dL (ref 0.40–1.20)
GFR: 58.75 mL/min — AB (ref >60.00)
GLUCOSE: 95 mg/dL (ref 70–99)
Potassium: 4 mEq/L (ref 3.5–5.1)
Sodium: 142 mEq/L (ref 135–145)
TOTAL PROTEIN: 6.7 g/dL (ref 6.0–8.3)

## 2018-04-13 LAB — LDL CHOLESTEROL, DIRECT: LDL DIRECT: 103 mg/dL

## 2018-04-13 NOTE — Telephone Encounter (Signed)
Left message to return call, ok for pec to speak to patient about message below 

## 2018-04-13 NOTE — Assessment & Plan Note (Signed)
She will continue to see urology. 

## 2018-04-13 NOTE — Telephone Encounter (Signed)
-----   Message from Leone Haven, MD sent at 04/13/2018  8:34 AM EDT ----- It looks like the patient is potentially due for a colonoscopy. Could you contact her and see if she has had one recently and if not would she like a referral for one? Thanks. Eric.

## 2018-04-13 NOTE — Patient Instructions (Signed)
Nice to see you. Please monitor your right ear and if it does not continue to improve please let us know. Please continue to work on dietary changes.

## 2018-04-13 NOTE — Progress Notes (Signed)
  Tommi Rumps, MD Phone: [phone]  Lauren [name] is a 77 y.o. female who presents today for f/u.  CC: htn, hld, OM, cataracts, urine incontinence  HYPERTENSION  Disease Monitoring  Home BP Monitoring [id number] systolic Chest pain- no    Dyspnea- no Medications  Compliance-  Taking amlodipine.  Edema- no  Otitis media: Seen for this previously in her right ear.  Notes her symptoms have improved significantly.  Occasionally she will have some aching that comes and goes.  No congestion.  No fevers.  She has completed amoxicillin.  Elevated LDL: Exercise is somewhat limited by left foot issue that is being managed by sports medicine.  She has tried to watch her diet and is eating a lot more vegetables and a lot less meat.  Cataracts: She underwent cataract surgery last year and subsequently developed clouding in her eyes.  She had laser treatment on the left eye and is going for the right eye this week.  Urinary incontinence: This is a chronic issue followed by urology.  They are planning to place a stimulator to help with this.  She did trial this and it was very beneficial.    Social History   Tobacco Use  Smoking Status Former Smoker  Smokeless Tobacco Never Used     ROS see history of present illness  Objective  Physical Exam Vitals:   04/13/18 0800  BP: 118/62  Pulse: 75  Resp: 18  Temp: 98 F (36.7 C)  SpO2: 98%    BP Readings from Last 3 Encounters:  04/13/18 118/62  04/03/18 (!) 142/60  03/04/18 124/70   Wt Readings from Last 3 Encounters:  04/13/18 149 lb 2 oz (67.6 kg)  04/03/18 147 lb 8 oz (66.9 kg)  03/04/18 150 lb 3.2 oz (68.1 kg)    Physical Exam  Constitutional: No distress.  HENT:  Normal TMs bilaterally  Cardiovascular: Normal rate, regular rhythm and normal heart sounds.  Pulmonary/Chest: Effort normal and breath sounds normal.  Musculoskeletal: She exhibits no edema.  Neurological: She is alert.  Skin: Skin is warm and dry. She is  not diaphoretic.     Assessment/Plan: Please see individual problem list.  HTN (hypertension) Well-controlled.  Continue amlodipine.  Check CMP.  Incontinence of urine in female She will continue to see urology.  Elevated LDL cholesterol level Check LDL.  Continue dietary changes.  Otitis media Status post treatment for this.  Still with some right ear discomfort intermittently though it is improving.  If it does not continue to improve over the next 1 to 2 weeks she will let us know.  Status post cataract extraction She will see ophthalmology as planned.   Orders Placed This Encounter  Procedures  . Direct LDL  . Comp Met (CMET)    No orders of the defined types were placed in this encounter.    Tommi Rumps, MD [place]

## 2018-04-13 NOTE — Assessment & Plan Note (Signed)
Well-controlled.  Continue amlodipine.  Check CMP. 

## 2018-04-13 NOTE — Assessment & Plan Note (Signed)
Status post treatment for this.  Still with some right ear discomfort intermittently though it is improving.  If it does not continue to improve over the next 1 to 2 weeks she will let us know.

## 2018-04-13 NOTE — Assessment & Plan Note (Signed)
Check LDL.  Continue dietary changes.

## 2018-04-13 NOTE — Assessment & Plan Note (Signed)
She will see ophthalmology as planned. 

## 2018-04-14 ENCOUNTER — Telehealth: Payer: Self-pay

## 2018-04-14 NOTE — Telephone Encounter (Signed)
Patient called and advised of the questions by Dr. Caryl Bis below on 04/14/18, she says no to all the questions. I advised Cologuard will be ordered based on her "no" responses. She says let Dr. Caryl Bis know she really appreciates it and will be on the look out for the Cologuard.

## 2018-04-14 NOTE — Telephone Encounter (Signed)
cologuard ordered and waiting for order to be signed by PCP

## 2018-04-14 NOTE — Telephone Encounter (Signed)
See other message

## 2018-04-14 NOTE — Telephone Encounter (Signed)
Please see encounter from [facility]

## 2018-04-14 NOTE — Telephone Encounter (Signed)
Please find out if she has a history of colon polyps, family history of colon cancer, or if she has had any rectal bleeding recently.  If she answers no to these questions we can order Cologuard.  Thanks.

## 2018-04-14 NOTE — Telephone Encounter (Signed)
>>   Apr 13, 2018  5:02 PM Leonides Schanz, Jacinto Reap wrote: Reason for CRM: Pt states she has not had a colonoscopy recently. Pt states she would prefer to have the cologuard sent to her home. Cb# [phone]

## 2018-04-14 NOTE — Telephone Encounter (Signed)
Left message to return call, ok for pec to speak to patient about message below 

## 2018-04-14 NOTE — Telephone Encounter (Signed)
Copied from [place] [id number]. Topic: General - Other >> Apr 13, 2018  5:02 PM Yvette Rack wrote: Reason for CRM: Pt states she has not had a colonoscopy recently. Pt states she would prefer to have the cologuard sent to her home. Cb# [phone]

## 2018-04-15 NOTE — Telephone Encounter (Signed)
faxed

## 2018-04-21 DIAGNOSIS — H26491 Other secondary cataract, right eye: Secondary | ICD-10-CM | POA: Diagnosis not present

## 2018-04-26 HISTORY — PX: SACRAL NERVE STIMULATOR PLACEMENT: SHX2367

## 2018-05-08 DIAGNOSIS — Z1211 Encounter for screening for malignant neoplasm of colon: Secondary | ICD-10-CM | POA: Diagnosis not present

## 2018-05-08 DIAGNOSIS — Z1212 Encounter for screening for malignant neoplasm of rectum: Secondary | ICD-10-CM | POA: Diagnosis not present

## 2018-05-08 LAB — COLOGUARD: Cologuard: POSITIVE

## 2018-05-20 ENCOUNTER — Telehealth: Payer: Self-pay

## 2018-05-20 DIAGNOSIS — R195 Other fecal abnormalities: Secondary | ICD-10-CM

## 2018-05-20 DIAGNOSIS — N3941 Urge incontinence: Secondary | ICD-10-CM | POA: Diagnosis not present

## 2018-05-20 NOTE — Telephone Encounter (Signed)
Copied from [place] [id number]. Topic: General - Other >> May 20, 2018  4:06 PM Carolyn Stare wrote:  Raquel Sarna with [employer] call to ask if abnormal cologuard results have been received    [phone]

## 2018-05-21 ENCOUNTER — Telehealth: Payer: Self-pay | Admitting: Family Medicine

## 2018-05-21 DIAGNOSIS — R195 Other fecal abnormalities: Secondary | ICD-10-CM

## 2018-05-21 NOTE — Telephone Encounter (Signed)
Called and spoke with pt. Pt advised and voiced understanding. Pt stated that's he just had bladder surgery yesterday that the inner stem surgery and now has a surgical implant. Pt stated that she does have a follow up appointment with surgeon and will discuss with him when or if she is OK to go through with a colonoscopy and how long she should wait before having a colonoscopy done.    Pt stated that after her OV with him she will let us know when to place the referral work like to heal from this surgery first before she does something else.

## 2018-05-21 NOTE — Telephone Encounter (Signed)
Called and spoke with Adult nurse. They are aware that we did receive this fax.   Sent to PCP to be made aware as well. Advise what next step will be.   Thanks

## 2018-05-21 NOTE — Telephone Encounter (Signed)
Please call the patient and let her know that her Cologuard screening test was positive.  This could indicate that she has colon polyps.  Less likely it would indicate that there could be a cancer.  Or there may be no abnormal lesions.  She needs to be referred to GI to undergo colonoscopy to evaluate for any underlying lesions.  I will place a referral once you speak with her.  Thanks.

## 2018-05-21 NOTE — Telephone Encounter (Signed)
Please contact the patient sometime next week to follow-up with her and see if she had seen the urologist for follow-up yet.  Thanks.

## 2018-05-21 NOTE — Telephone Encounter (Signed)
See other phone note

## 2018-05-21 NOTE — Telephone Encounter (Signed)
Noted. We will await her call back to place the referral.

## 2018-05-26 DIAGNOSIS — N3946 Mixed incontinence: Secondary | ICD-10-CM | POA: Diagnosis not present

## 2018-05-26 DIAGNOSIS — R350 Frequency of micturition: Secondary | ICD-10-CM | POA: Diagnosis not present

## 2018-05-28 ENCOUNTER — Ambulatory Visit: Payer: Self-pay

## 2018-05-28 ENCOUNTER — Encounter: Payer: Self-pay | Admitting: Family Medicine

## 2018-05-28 ENCOUNTER — Ambulatory Visit (INDEPENDENT_AMBULATORY_CARE_PROVIDER_SITE_OTHER): Payer: PPO | Admitting: Family Medicine

## 2018-05-28 VITALS — BP 144/62 | HR 81 | Ht 64.0 in | Wt 148.0 lb

## 2018-05-28 DIAGNOSIS — M79672 Pain in left foot: Secondary | ICD-10-CM

## 2018-05-28 DIAGNOSIS — M19079 Primary osteoarthritis, unspecified ankle and foot: Secondary | ICD-10-CM | POA: Insufficient documentation

## 2018-05-28 NOTE — Telephone Encounter (Signed)
Patient states she has been for her follow up on Tuesday, October 1st.   She said she would like to wait a few weeks before having the colonoscopy due to being sore. She would like to do her colonoscopy with Dr Carlean Purl. Please Advise.   She said she went to see Dr Tamala Julian today regarding her foot. She finally knows what is wrong with her foot. Nephrology and Arthritis. She will see him every 3 months and get a shot in that foot. He also gave her a handicap form to get a handicap sticker.

## 2018-05-28 NOTE — Assessment & Plan Note (Signed)
Patient given injection.  Tolerated procedure well.  Discussed icing regimen and home exercise discussed which activities to do which wants to avoid.  Follow-up again in 4 to 8 weeks can repeat injections every 12 weeks if necessary.  Patient is already wearing good shoes on a regular basis.

## 2018-05-28 NOTE — Patient Instructions (Signed)
Good to see you  Ice is your friend Injected the foot.  I hope it calls it down  Can repeat every 3 months if needed Make an appointment in 12 weeks

## 2018-05-28 NOTE — Telephone Encounter (Signed)
Called pt and left a VM to call back. CRM created and sent to [facility] pool. 

## 2018-05-28 NOTE — Progress Notes (Signed)
[name] [address], [place] [address] Phone: [phone] Subjective:    I [name] am serving as a Education administrator for Dr. Hulan Saas.   CC: Foot pain  [id number]  Lauren [name] is a 77 y.o. female coming in with complaint of left foot pain. States that her foot is not doing good. Between the 2nd and 3rd toe her foot turns white and she can't bend it. She then gets a cramp in her lower leg.  Patient did have an MRI showing the patient did have significant arthritic changes around the navicular and lateral cuneiform.  Patient states that she is only been able to ambulate approximately 100 feet without having to stop.  Trying to wear the good shoes on a more regular basis.  Rates the severity of pain at the moment is 9 out of 10      Past Medical History:  Diagnosis Date  . Allergic rhinitis   . Chickenpox   . Elevated LDL cholesterol level 11/26/2015  . Incontinence of urine in female 10/05/2015  . Migraines   . Urinary incontinence    Past Surgical History:  Procedure Laterality Date  . CHOLECYSTECTOMY  1995  . TONSILLECTOMY  1962   Social History   Socioeconomic History  . Marital status: Widowed    Spouse name: Not on file  . Number of children: Not on file  . Years of education: Not on file  . Highest education level: Not on file  Occupational History  . Not on file  Social Needs  . Financial resource strain: Not hard at all  . Food insecurity:    Worry: Never true    Inability: Never true  . Transportation needs:    Medical: No    Non-medical: No  Tobacco Use  . Smoking status: Former Research scientist (life sciences)  . Smokeless tobacco: Never Used  Substance and Sexual Activity  . Alcohol use: No    Alcohol/week: 0.0 standard drinks  . Drug use: No  . Sexual activity: Never  Lifestyle  . Physical activity:    Days per week: Not on file    Minutes per session: Not on file  . Stress: Not on file  Relationships  . Social connections:      Talks on phone: Not on file    Gets together: Not on file    Attends religious service: Not on file    Active member of club or organization: Not on file    Attends meetings of clubs or organizations: Not on file    Relationship status: Widowed  Other Topics Concern  . Not on file  Social History Narrative  . Not on file   Allergies  Allergen Reactions  . Eggs Or Egg-Derived Products   . Gabapentin Other (See Comments)    Unable to sleep  . Hydrochlorothiazide Other (See Comments)    Leg pain and aches  . Hydrocodone-Guaifenesin Other (See Comments)    Cant sleep   . Influenza Vaccines Other (See Comments)    Nausea and vomiting and stomach pain for 10 weeks  . Mobic [Meloxicam] Swelling  . Oxybutynin     Memory loss per patient  . Sulfa Antibiotics Other (See Comments)    Cold sweats, passing out   . Codeine Rash    Cold sweats    Family History  Problem Relation Age of Onset  . Arthritis Unknown   . Heart disease Unknown   . Hypertension Unknown   .  Hematuria Father      Current Facility-Administered Medications (Endocrine & Metabolic):  .  betamethasone acetate-betamethasone sodium phosphate (CELESTONE) injection 3 mg  Current Outpatient Medications (Cardiovascular):  .  amLODipine (NORVASC) 5 MG tablet, TAKE 1 TABLET(5 MG) BY MOUTH DAILY         Current Outpatient Medications (Other):  Marland Kitchen  Ascorbic Acid (VITAMIN C) 100 MG tablet, Take 100 mg by mouth daily. Marland Kitchen  latanoprost (XALATAN) 0.005 % ophthalmic solution,      Past medical history, social, surgical and family history all reviewed in electronic medical record.  No pertanent information unless stated regarding to the chief complaint.   Review of Systems:  No headache, visual changes, nausea, vomiting, diarrhea, constipation, dizziness, abdominal pain, skin rash, fevers, chills, night sweats, weight loss, swollen lymph nodes, body aches, joint swelling, muscle aches, chest pain, shortness of  breath, mood changes.   Objective  Blood pressure (!) 144/62, pulse 81, height 5\' 4"  (1.626 m), weight 148 lb (67.1 kg), SpO2 97 %.    General: No apparent distress alert and oriented x3 mood and affect normal, dressed appropriately.  HEENT: Pupils equal, extraocular movements intact  Respiratory: Patient's speak in full sentences and does not appear short of breath  Cardiovascular: No lower extremity edema, non tender, no erythema  Skin: Warm dry intact with no signs of infection or rash on extremities or on axial skeleton.  Abdomen: Soft nontender  Neuro: Cranial nerves II through XII are intact, neurovascularly intact in all extremities with 2+ DTRs and 2+ pulses.  Lymph: No lymphadenopathy of posterior or anterior cervical chain or axillae bilaterally.  MSK:  Non tender with full range of motion and good stability and symmetric strength and tone of shoulders, elbows, wrist, hip, knee and ankles bilaterally.  Arthritic changes of multiple joints Antalgic gait Left foot exam shows the patient does have a rigid midfoot.  Severely tender to palpation over the midfoot itself.  Patient neurovascularly does seem to be intact.  Does have some abnormal blood vessels and vascularity noted in the area.   Procedure: Real-time Ultrasound Guided Injection of left midfoot injection Device: GE Logiq Q7 Ultrasound guided injection is preferred based studies that show increased duration, increased effect, greater accuracy, decreased procedural pain, increased response rate, and decreased cost with ultrasound guided versus blind injection.  Verbal informed consent obtained.  Time-out conducted.  Noted no overlying erythema, induration, or other signs of local infection.  Skin prepped in a sterile fashion.  Local anesthesia: Topical Ethyl chloride.  With sterile technique and under real time ultrasound guidance with a 25-gauge half inch needle patient was injected with 0.5 cc of 0.5% Marcaine and 0.5 cc  of Kenalog 40 mg/mL into the midfoot on the left foot:   Completed without difficulty  Pain immediately resolved suggesting accurate placement of the medication.  Advised to call if fevers/chills, erythema, induration, drainage, or persistent bleeding.  Images permanently stored and available for review in the ultrasound unit.  Impression: Technically successful ultrasound guided injection.   Impression and Recommendations:     This case required medical decision making of moderate complexity. The above documentation has been reviewed and is accurate and complete Lyndal Pulley, DO       Note: This dictation was prepared with Dragon dictation along with smaller phrase technology. Any transcriptional errors that result from this process are unintentional.

## 2018-05-28 NOTE — Telephone Encounter (Signed)
Please advise 

## 2018-05-29 NOTE — Telephone Encounter (Signed)
Duplicated message.

## 2018-05-29 NOTE — Telephone Encounter (Signed)
Noted.  I will place a referral to Dr. Carlean Purl.  They will contact her to get her appointment set up.

## 2018-05-29 NOTE — Telephone Encounter (Signed)
Sent to PCP ?

## 2018-06-01 NOTE — Telephone Encounter (Signed)
Called and spoke with pt. Pt advised and voiced understanding. Pt stated that they have already called her for an appt. Pt plan to hold off on an appt at least a month since she has had multiple surgeries recently and having to be put to sleep.   Pt stated that she would reach out to Dr.Gessner's office once she is ready for an appt.

## 2018-06-14 ENCOUNTER — Other Ambulatory Visit: Payer: Self-pay | Admitting: Family Medicine

## 2018-06-14 DIAGNOSIS — I10 Essential (primary) hypertension: Secondary | ICD-10-CM

## 2018-06-15 ENCOUNTER — Telehealth: Payer: Self-pay | Admitting: Family Medicine

## 2018-06-15 NOTE — Telephone Encounter (Signed)
Pt  dropped off a letter for Dr. Caryl Bis. She is requesting a call back in regards to the letter  Please contact pt at [phone]

## 2018-06-15 NOTE — Telephone Encounter (Signed)
Placed in Dr. Ellen Henri color folder upfront

## 2018-06-16 NOTE — Telephone Encounter (Signed)
I have reviewed the message that the patient brought in.  I would be happy to see the patient in the office to discuss this though she could ask the urologist who placed it if there are certain things that she cannot have such as MRIs or other procedures or imaging studies as they would best since they placed the device.

## 2018-06-16 NOTE — Telephone Encounter (Signed)
Called pt and left a detailed VM with Dr. Ellen Henri message. Will try and call patient back again tomorrow.

## 2018-06-16 NOTE — Telephone Encounter (Signed)
Placed on Dr. Ellen Henri desk to review.   Thanks, [place]

## 2018-06-17 NOTE — Telephone Encounter (Signed)
Pt is returning call to [place]- states her VM sounded like she was underwater and pt cannot understand. Please advise.

## 2018-06-17 NOTE — Telephone Encounter (Signed)
Patient returning call to [place]. Please advise.  CB#: [phone]

## 2018-06-17 NOTE — Telephone Encounter (Signed)
Called and spoke with pt. Pt advised and voiced understanding.  

## 2018-07-02 ENCOUNTER — Encounter: Payer: Self-pay | Admitting: Family Medicine

## 2018-07-06 ENCOUNTER — Encounter: Payer: Self-pay | Admitting: Internal Medicine

## 2018-07-13 ENCOUNTER — Encounter: Payer: Self-pay | Admitting: Family Medicine

## 2018-08-03 ENCOUNTER — Other Ambulatory Visit: Payer: Self-pay | Admitting: Family Medicine

## 2018-08-03 DIAGNOSIS — I10 Essential (primary) hypertension: Secondary | ICD-10-CM

## 2018-08-03 NOTE — Telephone Encounter (Signed)
Copied from [place] [id number]. Topic: Quick Communication - Rx Refill/Question >> Aug 03, 2018  1:05 PM Waldemar Dickens, Sade R wrote: Medication: amLODipine (NORVASC) 5 MG tablet  Has the patient contacted their pharmacy? Yes  Preferred Pharmacy (with phone number or street name): [id number] - [address], [place] [phone] (Phone) [phone] (Fax)    Agent: Please be advised that RX refills may take up to 3 business days. We ask that you follow-up with your pharmacy.

## 2018-08-05 MED ORDER — AMLODIPINE BESYLATE 5 MG PO TABS: ORAL_TABLET | ORAL | 0 refills | Status: DC

## 2018-08-06 ENCOUNTER — Ambulatory Visit (INDEPENDENT_AMBULATORY_CARE_PROVIDER_SITE_OTHER): Payer: PPO | Admitting: Internal Medicine

## 2018-08-06 ENCOUNTER — Encounter: Payer: Self-pay | Admitting: Internal Medicine

## 2018-08-06 VITALS — BP 140/60 | HR 80 | Ht 63.0 in | Wt 149.4 lb

## 2018-08-06 DIAGNOSIS — Z800 Family history of malignant neoplasm of digestive organs: Secondary | ICD-10-CM

## 2018-08-06 DIAGNOSIS — R195 Other fecal abnormalities: Secondary | ICD-10-CM

## 2018-08-06 NOTE — Progress Notes (Signed)
Lauren [name] 77 y.o. 01-05-1941 [id number]  Assessment & Plan:   Encounter Diagnoses  Name Primary?  . Positive colorectal cancer screening using Cologuard test Yes  . Family history of colon cancer - daughter     Schedule colonoscopy. The risks and benefits as well as alternatives of endoscopic procedure(s) have been discussed and reviewed. All questions answered. The patient agrees to proceed.    Subjective:   Chief Complaint: Positive Cologuard  HPI The patient is here because of a positive Cologuard.  Does not have any active GI symptoms.  Her daughter was just diagnosed with colon cancer and her son is about to have a colonoscopy because of that.  She reports that she "thinks that came from their father's side because there was a colon cancer and ovarian cancer on his side" Allergies  Allergen Reactions  . Eggs Or Egg-Derived Products   . Gabapentin Other (See Comments)    Unable to sleep  . Hydrochlorothiazide Other (See Comments)    Leg pain and aches  . Hydrocodone-Guaifenesin Other (See Comments)    Cant sleep   . Influenza Vaccines Other (See Comments)    Nausea and vomiting and stomach pain for 10 weeks  . Mobic [Meloxicam] Swelling  . Oxybutynin     Memory loss per patient  . Sulfa Antibiotics Other (See Comments)    Cold sweats, passing out   . Codeine Rash    Cold sweats    Current Meds  Medication Sig  . amLODipine (NORVASC) 5 MG tablet TAKE 1 TABLET(5 MG) BY MOUTH DAILY  . Ascorbic Acid (VITAMIN C) 100 MG tablet Take 100 mg by mouth daily.  Marland Kitchen latanoprost (XALATAN) 0.005 % ophthalmic solution Place 1 drop into both eyes at bedtime.    Current Facility-Administered Medications for the 08/06/18 encounter (Office Visit) with Gatha Mayer, MD  Medication  . betamethasone acetate-betamethasone sodium phosphate (CELESTONE) injection 3 mg   Past Medical History:  Diagnosis Date  . Allergic rhinitis   . Chickenpox   . Elevated LDL cholesterol  level 11/26/2015  . Gallstones   . Glaucoma   . HTN (hypertension)   . Incontinence of urine in female 10/05/2015  . Kidney stones   . Migraines   . Urinary incontinence    Past Surgical History:  Procedure Laterality Date  . CHOLECYSTECTOMY  1995  . TONSILLECTOMY  1962  . TUBAL LIGATION     Social History   Social History Narrative   Widowed   1 son and 1 daughter   She is a retired Radiation protection practitioner   Former smoker   No alcohol or caffeine or drugs or any tobacco   family history includes Arthritis in her father; Atrial fibrillation in her mother; COPD in her mother; Heart disease in her father and mother; Hematuria in her father; Hypertension in her mother.   Review of Systems Overactive bladder with urinary leakage, recent Medtronic implant.  Some muscle cramps in the legs.  Objective:   Physical Exam BP 140/60 (BP Location: Left Arm, Patient Position: Sitting, Cuff Size: Normal)   Pulse 80   Ht 5\' 3"  (1.6 m) Comment: height measured without shoes  Wt 149 lb 6 oz (67.8 kg)   BMI 26.46 kg/m  @BP  140/60 (BP Location: Left Arm, Patient Position: Sitting, Cuff Size: Normal)   Pulse 80   Ht 5\' 3"  (1.6 m) Comment: height measured without shoes  Wt 149 lb 6 oz (67.8 kg)   BMI 26.46 kg/m @  General:  NAD Eyes:   anicteric Lungs:  clear Heart::  S1S2 no rubs, murmurs or gallops Abdomen:  soft and nontender, BS+ Ext:   no edema, cyanosis or clubbing

## 2018-08-06 NOTE — Patient Instructions (Signed)
If you are age 75 or older, your body mass index should be between 23-30. Your Body mass index is 26.46 kg/m. If this is out of the aforementioned range listed, please consider follow up with your Primary Care Provider.  If you are age 18 or younger, your body mass index should be between 19-25. Your Body mass index is 26.46 kg/m. If this is out of the aformentioned range listed, please consider follow up with your Primary Care Provider.   You have been scheduled for a colonoscopy. Please follow written instructions given to you at your visit today.  Please pick up your prep supplies at the pharmacy within the next 1-3 days. If you use inhalers (even only as needed), please bring them with you on the day of your procedure. Your physician has requested that you go to www.startemmi.com and enter the access code given to you at your visit today. This web site gives a general overview about your procedure. However, you should still follow specific instructions given to you by our office regarding your preparation for the procedure.  It was a pleasure to see you today!  Dr.Gessner

## 2018-08-07 ENCOUNTER — Encounter: Payer: Self-pay | Admitting: Internal Medicine

## 2018-08-18 ENCOUNTER — Encounter: Payer: Self-pay | Admitting: Internal Medicine

## 2018-08-27 ENCOUNTER — Encounter: Payer: Self-pay | Admitting: Internal Medicine

## 2018-08-27 ENCOUNTER — Encounter: Payer: PPO | Admitting: Internal Medicine

## 2018-08-27 ENCOUNTER — Ambulatory Visit (AMBULATORY_SURGERY_CENTER): Payer: PPO | Admitting: Internal Medicine

## 2018-08-27 VITALS — BP 138/62 | HR 82 | Temp 97.7°F | Resp 16 | Ht 63.0 in | Wt 149.0 lb

## 2018-08-27 DIAGNOSIS — R195 Other fecal abnormalities: Secondary | ICD-10-CM

## 2018-08-27 DIAGNOSIS — D123 Benign neoplasm of transverse colon: Secondary | ICD-10-CM

## 2018-08-27 DIAGNOSIS — D122 Benign neoplasm of ascending colon: Secondary | ICD-10-CM

## 2018-08-27 DIAGNOSIS — I10 Essential (primary) hypertension: Secondary | ICD-10-CM | POA: Diagnosis not present

## 2018-08-27 DIAGNOSIS — D120 Benign neoplasm of cecum: Secondary | ICD-10-CM

## 2018-08-27 DIAGNOSIS — K5730 Diverticulosis of large intestine without perforation or abscess without bleeding: Secondary | ICD-10-CM

## 2018-08-27 DIAGNOSIS — D124 Benign neoplasm of descending colon: Secondary | ICD-10-CM

## 2018-08-27 DIAGNOSIS — D125 Benign neoplasm of sigmoid colon: Secondary | ICD-10-CM | POA: Diagnosis not present

## 2018-08-27 HISTORY — PX: COLONOSCOPY: SHX174

## 2018-08-27 MED ORDER — SODIUM CHLORIDE 0.9 % IV SOLN: 500.0000 mL | Freq: Once | INTRAVENOUS | Status: DC

## 2018-08-27 NOTE — Patient Instructions (Addendum)
I found and removed 11 tiny to medium polyps that all look benign.  You also have a condition called diverticulosis - common and not usually a problem. Please read the handout provided.  It will probably make sense to repeat a colonoscopy 1 time - let me review the polyp pathology and then make a recommendation.  I appreciate the opportunity to care for you. Gatha Mayer, MD, FACG   YOU HAD AN ENDOSCOPIC PROCEDURE TODAY AT [facility]:   Refer to the procedure report that was given to you for any specific questions about what was found during the examination.  If the procedure report does not answer your questions, please call your gastroenterologist to clarify.  If you requested that your care partner not be given the details of your procedure findings, then the procedure report has been included in a sealed envelope for you to review at your convenience later.  YOU SHOULD EXPECT: Some feelings of bloating in the abdomen. Passage of more gas than usual.  Walking can help get rid of the air that was put into your GI tract during the procedure and reduce the bloating. If you had a lower endoscopy (such as a colonoscopy or flexible sigmoidoscopy) you may notice spotting of blood in your stool or on the toilet paper. If you underwent a bowel prep for your procedure, you may not have a normal bowel movement for a few days.  Please Note:  You might notice some irritation and congestion in your nose or some drainage.  This is from the oxygen used during your procedure.  There is no need for concern and it should clear up in a day or so.  SYMPTOMS TO REPORT IMMEDIATELY:   Following lower endoscopy (colonoscopy or flexible sigmoidoscopy):  Excessive amounts of blood in the stool  Significant tenderness or worsening of abdominal pains  Swelling of the abdomen that is new, acute  Fever of 100F or higher    For urgent or emergent issues, a gastroenterologist can be  reached at any hour by calling [phone].   DIET:  We do recommend a small meal at first, but then you may proceed to your regular diet.  Drink plenty of fluids but you should avoid alcoholic beverages for 24 hours.  ACTIVITY:  You should plan to take it easy for the rest of today and you should NOT DRIVE or use heavy machinery until tomorrow (because of the sedation medicines used during the test).    FOLLOW UP: Our staff will call the number listed on your records the next business day following your procedure to check on you and address any questions or concerns that you may have regarding the information given to you following your procedure. If we do not reach you, we will leave a message.  However, if you are feeling well and you are not experiencing any problems, there is no need to return our call.  We will assume that you have returned to your regular daily activities without incident.  If any biopsies were taken you will be contacted by phone or by letter within the next 1-3 weeks.  Please call us at [phone] if you have not heard about the biopsies in 3 weeks.    SIGNATURES/CONFIDENTIALITY: You and/or your care partner have signed paperwork which will be entered into your electronic medical record.  These signatures attest to the fact that that the information above on your After Visit Summary has been reviewed  and is understood.  Full responsibility of the confidentiality of this discharge information lies with you and/or your care-partner. 

## 2018-08-27 NOTE — Progress Notes (Signed)
[name] [address], [place] [address] Phone: [phone] Subjective:   [name], am serving as a scribe for Dr. Hulan Saas.   CC: Left foot pain follow-up  [id number]  Lauren [name] is a 78 y.o. female coming in with complaint of left foot pain. Said that the injection did help but that it is wearing off. Pain is increasing. Patient notes a decrease in ROM. Feels that her ankle  Is doing relatively well.  More pain in the foot than anything else.  Has responded well to midfoot injections in the past.  Last one was 3 months ago     Past Medical History:  Diagnosis Date  . Allergic rhinitis   . Chickenpox   . Elevated LDL cholesterol level 11/26/2015  . Gallstones   . Glaucoma   . HTN (hypertension)   . Incontinence of urine in female 10/05/2015  . Kidney stones   . Migraines   . Urinary incontinence    Past Surgical History:  Procedure Laterality Date  . CHOLECYSTECTOMY  1995  . SACRAL NERVE STIMULATOR PLACEMENT  04/2018   To control overactive bladder  . TONSILLECTOMY  1962  . TUBAL LIGATION     Social History   Socioeconomic History  . Marital status: Widowed    Spouse name: Not on file  . Number of children: 2  . Years of education: Not on file  . Highest education level: Not on file  Occupational History  . Occupation: retired  Scientific laboratory technician  . Financial resource strain: Not hard at all  . Food insecurity:    Worry: Never true    Inability: Never true  . Transportation needs:    Medical: No    Non-medical: No  Tobacco Use  . Smoking status: Former Smoker    Types: Cigarettes    Last attempt to quit: 08/06/1993    Years since quitting: 25.0  . Smokeless tobacco: Never Used  Substance and Sexual Activity  . Alcohol use: No    Alcohol/week: 0.0 standard drinks  . Drug use: No  . Sexual activity: Never  Lifestyle  . Physical activity:    Days per week: Not on file    Minutes per session: Not on  file  . Stress: Not on file  Relationships  . Social connections:    Talks on phone: Not on file    Gets together: Not on file    Attends religious service: Not on file    Active member of club or organization: Not on file    Attends meetings of clubs or organizations: Not on file    Relationship status: Widowed  Other Topics Concern  . Not on file  Social History Narrative   Widowed   1 son and 1 daughter   She is a retired Radiation protection practitioner   Former smoker   No alcohol or caffeine or drugs or any tobacco   Allergies  Allergen Reactions  . Eggs Or Egg-Derived Products   . Gabapentin Other (See Comments)    Unable to sleep  . Hydrochlorothiazide Other (See Comments)    Leg pain and aches  . Hydrocodone-Guaifenesin Other (See Comments)    Cant sleep   . Influenza Vaccines Other (See Comments)    Nausea and vomiting and stomach pain for 10 weeks  . Mobic [Meloxicam] Swelling  . Oxybutynin     Memory loss per patient  . Sulfa Antibiotics Other (See Comments)  Cold sweats, passing out   . Codeine Rash    Cold sweats    Family History  Problem Relation Age of Onset  . Hematuria Father   . Heart disease Father   . Arthritis Father   . Heart disease Mother   . COPD Mother   . Atrial fibrillation Mother   . Hypertension Mother      Current Facility-Administered Medications (Endocrine & Metabolic):  .  betamethasone acetate-betamethasone sodium phosphate (CELESTONE) injection 3 mg  Current Outpatient Medications (Cardiovascular):  .  amLODipine (NORVASC) 5 MG tablet, TAKE 1 TABLET(5 MG) BY MOUTH DAILY         Current Outpatient Medications (Other):  Marland Kitchen  Ascorbic Acid (VITAMIN C) 100 MG tablet, Take 100 mg by mouth daily. Marland Kitchen  latanoprost (XALATAN) 0.005 % ophthalmic solution, Place 1 drop into both eyes at bedtime.      Past medical history, social, surgical and family history all reviewed in electronic medical record.  No pertanent information unless stated  regarding to the chief complaint.   Review of Systems:  No headache, visual changes, nausea, vomiting, diarrhea, constipation, dizziness, abdominal pain, skin rash, fevers, chills, night sweats, weight loss, swollen lymph nodes, body aches, joint swelling, muscle aches, chest pain, shortness of breath, mood changes.   Objective  There were no vitals taken for this visit. Systems examined below as of    General: No apparent distress alert and oriented x3 mood and affect normal, dressed appropriately.  HEENT: Pupils equal, extraocular movements intact  Respiratory: Patient's speak in full sentences and does not appear short of breath  Cardiovascular: No lower extremity edema, non tender, no erythema  Skin: Warm dry intact with no signs of infection or rash on extremities or on axial skeleton.  Abdomen: Soft nontender  Neuro: Cranial nerves II through XII are intact, neurovascularly intact in all extremities with 2+ DTRs and 2+ pulses.  Lymph: No lymphadenopathy of posterior or anterior cervical chain or axillae bilaterally.  Gait antalgic MSK:  tender with full range of motion and good stability and symmetric strength and tone of shoulders, elbows, wrist, hip, knee and bilaterally.  Left foot exam shows the patient does have a bossing of the dorsal aspect of the foot.  Patient does have a rigid midfoot.  Pes cavus noted.  Some splaying between the first and second toes. Right foot exam shows some arthritic changes but nontender  Procedure: Real-time Ultrasound Guided Injection of left midfoot Device: GE Logiq Q7 Ultrasound guided injection is preferred based studies that show increased duration, increased effect, greater accuracy, decreased procedural pain, increased response rate, and decreased cost with ultrasound guided versus blind injection.  Verbal informed consent obtained.  Time-out conducted.  Noted no overlying erythema, induration, or other signs of local infection.  Skin prepped  in a sterile fashion.  Local anesthesia: Topical Ethyl chloride.  With sterile technique and under real time ultrasound guidance: With a 25-gauge half inch needle patient was injected with 0.5 cc of 0.5% Marcaine and 0.5 cc of Kenalog 40 mg/mL into the midfoot between the third and fourth metatarsal heads. Completed without difficulty  Pain immediately resolved suggesting accurate placement of the medication.  Advised to call if fevers/chills, erythema, induration, drainage, or persistent bleeding.  Images permanently stored and available for review in the ultrasound unit.  Impression: Technically successful ultrasound guided injection.    Impression and Recommendations:     This case required medical decision making of moderate complexity. The above documentation has  been reviewed and is accurate and complete Lyndal Pulley, DO       Note: This dictation was prepared with Dragon dictation along with smaller phrase technology. Any transcriptional errors that result from this process are unintentional.

## 2018-08-27 NOTE — Op Note (Addendum)
[place] Patient Name: Lauren [name] Procedure Date: 08/27/2018 1:56 PM MRN: [id number] Endoscopist: Gatha Mayer , MD Age: 78 Referring MD:  Date of Birth: Jun 16, 1941 Gender: Female Account #: [id number] Procedure:                Colonoscopy Indications:              Positive Cologuard test Medicines:                Midazolam 3 mg IV, Fentanyl 100 micrograms IV,                            Monitored Anesthesia Care Procedure:                Pre-Anesthesia Assessment:                           - Prior to the procedure, a History and Physical                            was performed, and patient medications and                            allergies were reviewed. The patient's tolerance of                            previous anesthesia was also reviewed. The risks                            and benefits of the procedure and the sedation                            options and risks were discussed with the patient.                            All questions were answered, and informed consent                            was obtained. Prior Anticoagulants: The patient has                            taken no previous anticoagulant or antiplatelet                            agents. ASA Grade Assessment: II - A patient with                            mild systemic disease. After reviewing the risks                            and benefits, the patient was deemed in                            satisfactory condition to undergo the procedure.  After obtaining informed consent, the colonoscope                            was passed under direct vision. Throughout the                            procedure, the patient's blood pressure, pulse, and                            oxygen saturations were monitored continuously. The                            Model PCF-H190DL [id number]) scope was introduced                            through the anus and advanced to the the  cecum,                            identified by appendiceal orifice and ileocecal                            valve. The colonoscopy was performed without                            difficulty. The patient tolerated the procedure                            well. The quality of the bowel preparation was                            excellent. The ileocecal valve, appendiceal                            orifice, and rectum were photographed. The bowel                            preparation used was Miralax. Scope In: 2:04:09 PM Scope Out: 2:37:07 PM Scope Withdrawal Time: 0 hours 28 minutes 15 seconds  Total Procedure Duration: 0 hours 32 minutes 58 seconds  Findings:                 Eleven sessile polyps were found in the sigmoid                            colon, descending colon, transverse colon,                            ascending colon and cecum. The polyps were 1 to 10                            mm in size. These polyps were removed with a cold                            snare. Resection and retrieval were complete.  Verification of patient identification for the                            specimen was done. Estimated blood loss was minimal.                           Many small and large-mouthed diverticula were found                            in the entire colon.                           The exam was otherwise without abnormality on                            direct and retroflexion views. Complications:            No immediate complications. Estimated Blood Loss:     Estimated blood loss was minimal. Impression:               - Eleven 1 to 10 mm polyps in the sigmoid colon, in                            the descending colon, in the transverse colon, in                            the ascending colon and in the cecum, removed with                            a cold snare. Resected and retrieved.                           - Diverticulosis in the entire examined  colon.                           - The examination was otherwise normal on direct                            and retroflexion views. Recommendation:           - Patient has a contact number available for                            emergencies. The signs and symptoms of potential                            delayed complications were discussed with the                            patient. Return to normal activities tomorrow.                            Written discharge instructions were provided to the  patient.                           - Resume previous diet.                           - Continue present medications.                           - Repeat colonoscopy is recommended for                            surveillance. The colonoscopy date will be                            determined after pathology results from today's                            exam become available for review. Gatha Mayer, MD 08/27/2018 2:51:57 PM This report has been signed electronically.

## 2018-08-27 NOTE — Progress Notes (Signed)
Called to room to assist during endoscopic procedure.  Patient ID and intended procedure confirmed with present staff. Received instructions for my participation in the procedure from the performing physician.  

## 2018-08-27 NOTE — Progress Notes (Signed)
Pt's states no medical or surgical changes since previsit or office visit. 

## 2018-08-27 NOTE — Progress Notes (Signed)
Report given to PACU, vss 

## 2018-08-28 ENCOUNTER — Telehealth: Payer: Self-pay | Admitting: *Deleted

## 2018-08-28 ENCOUNTER — Ambulatory Visit: Payer: Self-pay

## 2018-08-28 ENCOUNTER — Encounter: Payer: Self-pay | Admitting: Family Medicine

## 2018-08-28 ENCOUNTER — Ambulatory Visit: Payer: PPO | Admitting: Family Medicine

## 2018-08-28 VITALS — BP 122/74 | HR 78 | Ht 63.0 in | Wt 149.0 lb

## 2018-08-28 DIAGNOSIS — M19079 Primary osteoarthritis, unspecified ankle and foot: Secondary | ICD-10-CM | POA: Diagnosis not present

## 2018-08-28 DIAGNOSIS — M79672 Pain in left foot: Secondary | ICD-10-CM | POA: Diagnosis not present

## 2018-08-28 NOTE — Assessment & Plan Note (Signed)
Repeat injection given today.  Discussed proper shoes, home exercise, icing regimen, which activities to do which wants to avoid.  Patient will follow-up with me again in 3 months as long as patient continues to do well.

## 2018-08-28 NOTE — Telephone Encounter (Signed)
No answer, left message to call if questions or concerns. 

## 2018-08-28 NOTE — Telephone Encounter (Signed)
Left message on f/u callback 

## 2018-09-02 ENCOUNTER — Encounter: Payer: Self-pay | Admitting: Internal Medicine

## 2018-09-02 DIAGNOSIS — Z86010 Personal history of colonic polyps: Secondary | ICD-10-CM

## 2018-09-02 DIAGNOSIS — Z860101 Personal history of adenomatous and serrated colon polyps: Secondary | ICD-10-CM

## 2018-09-02 HISTORY — DX: Personal history of colonic polyps: Z86.010

## 2018-09-02 HISTORY — DX: Personal history of adenomatous and serrated colon polyps: Z86.0101

## 2018-09-02 NOTE — Progress Notes (Signed)
11 polyps max 10 mm Mix tub adenoma and ssp's Recall 1 year My Chart letter

## 2018-09-04 DIAGNOSIS — N3946 Mixed incontinence: Secondary | ICD-10-CM | POA: Diagnosis not present

## 2018-09-07 ENCOUNTER — Encounter: Payer: Self-pay | Admitting: Family Medicine

## 2018-09-07 ENCOUNTER — Ambulatory Visit (INDEPENDENT_AMBULATORY_CARE_PROVIDER_SITE_OTHER): Payer: PPO | Admitting: Family Medicine

## 2018-09-07 VITALS — BP 140/64 | HR 73 | Temp 98.0°F | Ht 63.0 in | Wt 148.2 lb

## 2018-09-07 DIAGNOSIS — Z1239 Encounter for other screening for malignant neoplasm of breast: Secondary | ICD-10-CM

## 2018-09-07 DIAGNOSIS — M810 Age-related osteoporosis without current pathological fracture: Secondary | ICD-10-CM | POA: Diagnosis not present

## 2018-09-07 DIAGNOSIS — Z86010 Personal history of colonic polyps: Secondary | ICD-10-CM

## 2018-09-07 DIAGNOSIS — R32 Unspecified urinary incontinence: Secondary | ICD-10-CM

## 2018-09-07 DIAGNOSIS — I10 Essential (primary) hypertension: Secondary | ICD-10-CM | POA: Diagnosis not present

## 2018-09-07 LAB — COMPREHENSIVE METABOLIC PANEL
ALK PHOS: 57 U/L (ref 39–117)
ALT: 13 U/L (ref 0–35)
AST: 18 U/L (ref 0–37)
Albumin: 4.2 g/dL (ref 3.5–5.2)
BUN: 22 mg/dL (ref 6–23)
CO2: 28 mEq/L (ref 19–32)
Calcium: 9.4 mg/dL (ref 8.4–10.5)
Chloride: 104 mEq/L (ref 96–112)
Creatinine, Ser: 0.85 mg/dL (ref 0.40–1.20)
GFR: 69.16 mL/min (ref >60.00)
Glucose, Bld: 89 mg/dL (ref 70–99)
POTASSIUM: 4.4 meq/L (ref 3.5–5.1)
Sodium: 141 mEq/L (ref 135–145)
TOTAL PROTEIN: 6.9 g/dL (ref 6.0–8.3)
Total Bilirubin: 0.5 mg/dL (ref 0.2–1.2)

## 2018-09-07 LAB — VITAMIN D 25 HYDROXY (VIT D DEFICIENCY, FRACTURES): VITD: 25.82 ng/mL — AB (ref 30.00–100.00)

## 2018-09-07 NOTE — Assessment & Plan Note (Signed)
She is aware that she needs repeat colonoscopy in 1 year.

## 2018-09-07 NOTE — Assessment & Plan Note (Signed)
Well-controlled with bladder stimulator.  She will continue to see urology.

## 2018-09-07 NOTE — Patient Instructions (Addendum)
Nice to see you. We will check lab work today and contact you with the results. Get you set up for a bone density scan. Please call to schedule your mammogram.  You will be due in March.

## 2018-09-07 NOTE — Assessment & Plan Note (Addendum)
She is due for repeat DEXA scan.  She reports she cannot have x-ray imaging given her bladder stimulator.  We will send a message to her urologist.  If she does not hear from [place] in the next 2 weeks regarding this she will contact us.  She also reports that she has an information book regarding the stimulator and she will bring this by for me to review.  Check vitamin D.

## 2018-09-07 NOTE — Assessment & Plan Note (Signed)
Still adequately controlled for age.  She will continue to monitor.

## 2018-09-07 NOTE — Progress Notes (Signed)
Tommi Rumps, MD Phone: [phone]  Lauren [name] is a 78 y.o. female who presents today for follow-up.  CC: Hypertension, osteoporosis, colon polyps, urinary incontinence  Hypertension: Noted it has run a little higher since she found out her daughter had colon cancer.  Has been running around 982 systolically.  Taking amlodipine.  No chest pain, shortness of breath, or edema.  Osteoporosis: She is not taking vitamin D or calcium.  She is due for a DEXA scan.  Colon polyps: Recently had colonoscopy which revealed 11 polyps.  They advised 1 year recall.  Her daughter recently had a colonoscopy which revealed colon cancer.  She is undergoing chemotherapy.  Urinary incontinence: She notes this is significantly improved.  She has a stimulator and has decreased caffeine intake and she is 98% dry.  She reports she cannot have x-rays given this stimulator.  Social History   Tobacco Use  Smoking Status Former Smoker  . Types: Cigarettes  . Last attempt to quit: 08/06/1993  . Years since quitting: 25.1  Smokeless Tobacco Never Used     ROS see history of present illness  Objective  Physical Exam Vitals:   09/07/18 0819  BP: 140/64  Pulse: 73  Temp: 98 F (36.7 C)  SpO2: 98%    BP Readings from Last 3 Encounters:  09/07/18 140/64  08/28/18 122/74  08/27/18 138/62   Wt Readings from Last 3 Encounters:  09/07/18 148 lb 3.2 oz (67.2 kg)  08/28/18 149 lb (67.6 kg)  08/27/18 149 lb (67.6 kg)    Physical Exam Constitutional:      General: She is not in acute distress.    Appearance: She is not diaphoretic.  Cardiovascular:     Rate and Rhythm: Normal rate and regular rhythm.     Heart sounds: Normal heart sounds.  Pulmonary:     Effort: Pulmonary effort is normal.     Breath sounds: Normal breath sounds.  Skin:    General: Skin is warm and dry.  Neurological:     Mental Status: She is alert.      Assessment/Plan: Please see individual problem list.  HTN  (hypertension) Still adequately controlled for age.  She will continue to monitor.  Osteoporosis She is due for repeat DEXA scan.  She reports she cannot have x-ray imaging given her bladder stimulator.  We will send a message to her urologist.  If she does not hear from [place] in the next 2 weeks regarding this she will contact us.  She also reports that she has an information book regarding the stimulator and she will bring this by for me to review.  Check vitamin D.  Incontinence of urine in female Well-controlled with bladder stimulator.  She will continue to see urology.  Hx of adenomatous and sessile serrated colonic polyps She is aware that she needs repeat colonoscopy in 1 year.   Orders Placed This Encounter  Procedures  . DG Bone Density    Standing Status:   Future    Standing Expiration Date:   11/06/2019    Order Specific Question:   Reason for Exam (SYMPTOM  OR DIAGNOSIS REQUIRED)    Answer:   osteoporosis follow-up    Order Specific Question:   Preferred imaging location?    Answer:   [facility]  . MM 3D SCREEN BREAST BILATERAL    Standing Status:   Future    Standing Expiration Date:   11/06/2019    Order Specific Question:   Reason for  Exam (SYMPTOM  OR DIAGNOSIS REQUIRED)    Answer:   breast csncer screening    Order Specific Question:   Preferred imaging location?    Answer:   [facility]  . Comp Met (CMET)  . Vitamin D (25 hydroxy)    No orders of the defined types were placed in this encounter.    Tommi Rumps, MD [place]

## 2018-09-09 DIAGNOSIS — H401131 Primary open-angle glaucoma, bilateral, mild stage: Secondary | ICD-10-CM | POA: Diagnosis not present

## 2018-09-15 ENCOUNTER — Telehealth: Payer: Self-pay | Admitting: Family Medicine

## 2018-09-15 NOTE — Telephone Encounter (Signed)
Pt dropped off a box with her [employer] booklets. Box is up front by color folders. Please call patient or MyChart her when Dr. Kelle Darting is finished reviewing booklets.

## 2018-09-16 NOTE — Telephone Encounter (Signed)
Santiago Glad is this still up there because I did NOT see it earlier.

## 2018-09-17 NOTE — Telephone Encounter (Signed)
Placed on Dr. Ellen Henri desk

## 2018-09-17 NOTE — Telephone Encounter (Signed)
Yes, it is a small white box sitting in front of the color folders with Dr. Ellen Henri name on it.

## 2018-09-23 NOTE — Telephone Encounter (Signed)
Noted.  I reviewed one of the booklets where it states that x-ray imaging is unlikely to affect the InterStim system.  Please let her know she can pick the booklet up.  We could discuss bone density testing further at her follow-up visit.

## 2018-09-24 NOTE — Telephone Encounter (Signed)
Called and spoke with patient. Pt advised. She stated that she is coming in for an appt tomorrow for cold symptoms so she would like to discuss this at that time.

## 2018-09-25 ENCOUNTER — Ambulatory Visit (INDEPENDENT_AMBULATORY_CARE_PROVIDER_SITE_OTHER): Payer: PPO | Admitting: Family Medicine

## 2018-09-25 ENCOUNTER — Encounter: Payer: Self-pay | Admitting: Family Medicine

## 2018-09-25 VITALS — BP 132/60 | HR 97 | Temp 97.9°F | Wt 141.0 lb

## 2018-09-25 DIAGNOSIS — B9689 Other specified bacterial agents as the cause of diseases classified elsewhere: Secondary | ICD-10-CM | POA: Diagnosis not present

## 2018-09-25 DIAGNOSIS — R062 Wheezing: Secondary | ICD-10-CM | POA: Diagnosis not present

## 2018-09-25 DIAGNOSIS — J208 Acute bronchitis due to other specified organisms: Secondary | ICD-10-CM | POA: Diagnosis not present

## 2018-09-25 MED ORDER — ALBUTEROL SULFATE HFA 108 (90 BASE) MCG/ACT IN AERS: 2.0000 | INHALATION_SPRAY | Freq: Four times a day (QID) | RESPIRATORY_TRACT | 0 refills | Status: DC | PRN

## 2018-09-25 MED ORDER — METHYLPREDNISOLONE 4 MG PO TBPK: ORAL_TABLET | ORAL | 0 refills | Status: DC

## 2018-09-25 MED ORDER — AZITHROMYCIN 250 MG PO TABS: ORAL_TABLET | ORAL | 0 refills | Status: DC

## 2018-09-25 NOTE — Progress Notes (Signed)
Subjective:    Patient ID: Lauren [name], female    DOB: 29-Nov-1940, 78 y.o.   MRN: [id number]  HPI   Patient presents to clinic complaining of cough, chest congestion for the past week.  Patient states she has been trying to use over-the-counter cough medications, but continues to feel a rattling in her chest and sometimes will bring up white or tan phlegm.  Denies chest pain.  Denies nausea/vomiting or diarrhea.  States sometimes she feels like she will wheeze when having a coughing spell.  Patient Active Problem List   Diagnosis Date Noted  . Hx of adenomatous and sessile serrated colonic polyps 09/02/2018  . Arthritis of midfoot 05/28/2018  . Status post cataract extraction 04/13/2018  . Overweight 03/04/2018  . Low serum vitamin B12 10/13/2017  . Morton's neuroma of third interspace of left foot 09/22/2017  . Loss of transverse plantar arch of left foot 09/22/2017  . Pain and swelling of left lower leg 08/20/2017  . Memory deficit 07/02/2017  . HTN (hypertension) 07/02/2017  . Dry skin 08/31/2016  . History of shingles 08/31/2016  . Left foot pain 05/24/2016  . Osteoporosis 02/21/2016  . Elevated LDL cholesterol level 11/26/2015  . Incontinence of urine in female 10/05/2015   Social History   Tobacco Use  . Smoking status: Former Smoker    Types: Cigarettes    Last attempt to quit: 08/06/1993    Years since quitting: 25.1  . Smokeless tobacco: Never Used  Substance Use Topics  . Alcohol use: No    Alcohol/week: 0.0 standard drinks   Review of Systems  Constitutional: Negative for chills, fatigue and fever.  HENT: Negative for congestion, ear pain, sinus pain and sore throat.   Eyes: Negative.   Respiratory: +cough, shortness of breath and wheezing.   Cardiovascular: Negative for chest pain, palpitations and leg swelling.  Gastrointestinal: Negative for abdominal pain, diarrhea, nausea and vomiting.  Genitourinary: Negative for dysuria, frequency and urgency.    Musculoskeletal: Negative for arthralgias and myalgias.  Skin: Negative for color change, pallor and rash.  Neurological: Negative for syncope, light-headedness and headaches.  Psychiatric/Behavioral: The patient is not nervous/anxious.       Objective:   Physical Exam Vitals signs and nursing note reviewed.  Constitutional:      General: She is not in acute distress.    Appearance: She is not toxic-appearing or diaphoretic.  HENT:     Head: Normocephalic and atraumatic.     Nose:     Comments: +post nasal drip    Mouth/Throat:     Mouth: Mucous membranes are moist.  Eyes:     General: No scleral icterus.    Extraocular Movements: Extraocular movements intact.     Conjunctiva/sclera: Conjunctivae normal.  Neck:     Musculoskeletal: Neck supple. No neck rigidity.  Cardiovascular:     Rate and Rhythm: Normal rate and regular rhythm.  Pulmonary:     Effort: Pulmonary effort is normal. No respiratory distress.     Breath sounds: Wheezing and rhonchi present. No rales.     Comments: Scattered upper resp wheezes and ronchi Musculoskeletal:     Right lower leg: No edema.     Left lower leg: No edema.  Lymphadenopathy:     Cervical: No cervical adenopathy.  Skin:    General: Skin is warm and dry.     Coloration: Skin is not pale.  Neurological:     Mental Status: She is alert and oriented to person,  place, and time.  Psychiatric:        Mood and Affect: Mood normal.        Behavior: Behavior normal.     Vitals:   09/25/18 1544  BP: 132/60  Pulse: 97  Temp: 97.9 F (36.6 C)  SpO2: 96%      Assessment & Plan:   Acute bacterial bronchitis/wheezes-suspect patient's symptoms began as a viral upper respiratory, but now changed to bacterial bronchitis due to discoloration of phlegm.  Patient will take Z-Pak, use albuterol inhaler as needed for shortness of breath or wheezing and take steroid taper.  Advised to trial over-the-counter Mucinex tablets or Delsym cough syrup to  help reduce cough.  Advised to rest, increase fluid intake and do good handwashing.  Advised to return to clinic if current symptoms persist or worsen.  Otherwise she will keep already scheduled follow-up as planned with PCP

## 2018-09-28 ENCOUNTER — Telehealth: Payer: Self-pay | Admitting: *Deleted

## 2018-09-28 ENCOUNTER — Encounter: Payer: Self-pay | Admitting: Family Medicine

## 2018-09-28 NOTE — Telephone Encounter (Signed)
Sent to [employer] as an [employer]

## 2018-09-28 NOTE — Telephone Encounter (Signed)
Copied from [facility] #[id number]. Topic: General - Other >> Sep 28, 2018  8:29 AM [name] wrote: Reason for CRM:pt called and stated that Dr Caryl Bis wants her to get a mammogram and a bone density test and does not want to have these in [place] because of a bad experience. Would like to have this done at [facility] imaging.  Pt states that she can do Mondays wednesdays and fridays in the morning. Please advise

## 2018-10-12 ENCOUNTER — Ambulatory Visit: Payer: Self-pay | Admitting: *Deleted

## 2018-10-12 NOTE — Progress Notes (Signed)
[name] [place], [place] [address] Phone: [phone] Subjective:   [name], am serving as a scribe for Dr. Hulan Saas.   CC: Left foot pain  [id number]  Lauren [name] is a 78 y.o. female coming in with complaint of left foot pain. Has been having an increase in pain and swelling. Pain occurring over the metatarsal heads.  Known arthritic changes.  Patient states that this was different.  Was sitting in a waiting room and started having spontaneous swelling.  Increasing pain around the toes.  This happened 5 days ago.  The swelling is improving slowly but still has more pain.     Past Medical History:  Diagnosis Date  . Allergic rhinitis   . Chickenpox   . Elevated LDL cholesterol level 11/26/2015  . Gallstones   . Glaucoma   . HTN (hypertension)   . Hx of adenomatous and sessile serrated colonic polyps 09/02/2018  . Incontinence of urine in female 10/05/2015  . Kidney stones   . Migraines   . Urinary incontinence    Past Surgical History:  Procedure Laterality Date  . CHOLECYSTECTOMY  1995  . SACRAL NERVE STIMULATOR PLACEMENT  04/2018   To control overactive bladder  . TONSILLECTOMY  1962  . TUBAL LIGATION     Social History   Socioeconomic History  . Marital status: Widowed    Spouse name: Not on file  . Number of children: 2  . Years of education: Not on file  . Highest education level: Not on file  Occupational History  . Occupation: retired  Scientific laboratory technician  . Financial resource strain: Not hard at all  . Food insecurity:    Worry: Never true    Inability: Never true  . Transportation needs:    Medical: No    Non-medical: No  Tobacco Use  . Smoking status: Former Smoker    Types: Cigarettes    Last attempt to quit: 08/06/1993    Years since quitting: 25.2  . Smokeless tobacco: Never Used  Substance and Sexual Activity  . Alcohol use: No    Alcohol/week: 0.0 standard drinks  . Drug use: No  .  Sexual activity: Never  Lifestyle  . Physical activity:    Days per week: Not on file    Minutes per session: Not on file  . Stress: Not on file  Relationships  . Social connections:    Talks on phone: Not on file    Gets together: Not on file    Attends religious service: Not on file    Active member of club or organization: Not on file    Attends meetings of clubs or organizations: Not on file    Relationship status: Widowed  Other Topics Concern  . Not on file  Social History Narrative   Widowed   1 son and 1 daughter   She is a retired Radiation protection practitioner   Former smoker   No alcohol or caffeine or drugs or any tobacco   Allergies  Allergen Reactions  . Eggs Or Egg-Derived Products   . Gabapentin Other (See Comments)    Unable to sleep  . Hydrochlorothiazide Other (See Comments)    Leg pain and aches  . Hydrocodone-Guaifenesin Other (See Comments)    Cant sleep   . Influenza Vaccines Other (See Comments)    Nausea and vomiting and stomach pain for 10 weeks  . Mobic [Meloxicam] Swelling  . Oxybutynin  Memory loss per patient  . Sulfa Antibiotics Other (See Comments)    Cold sweats, passing out   . Codeine Rash    Cold sweats    Family History  Problem Relation Age of Onset  . Hematuria Father   . Heart disease Father   . Arthritis Father   . Heart disease Mother   . COPD Mother   . Atrial fibrillation Mother   . Hypertension Mother   . Colon cancer Daughter     Current Outpatient Medications (Endocrine & Metabolic):  .  methylPREDNISolone (MEDROL DOSEPAK) 4 MG TBPK tablet, Take according to pack instructions.  Current Facility-Administered Medications (Endocrine & Metabolic):  .  betamethasone acetate-betamethasone sodium phosphate (CELESTONE) injection 3 mg  Current Outpatient Medications (Cardiovascular):  .  amLODipine (NORVASC) 5 MG tablet, TAKE 1 TABLET(5 MG) BY MOUTH DAILY   Current Outpatient Medications (Respiratory):  .  albuterol (PROVENTIL  HFA;VENTOLIN HFA) 108 (90 Base) MCG/ACT inhaler, Inhale 2 puffs into the lungs every 6 (six) hours as needed for wheezing or shortness of breath.       Current Outpatient Medications (Other):  Marland Kitchen  Ascorbic Acid (VITAMIN C) 100 MG tablet, Take 100 mg by mouth daily. Marland Kitchen  azithromycin (ZITHROMAX) 250 MG tablet, Take 2 tablets on day 1. Take 1 tablet on day 2-5. Marland Kitchen  latanoprost (XALATAN) 0.005 % ophthalmic solution, Place 1 drop into both eyes at bedtime.      Past medical history, social, surgical and family history all reviewed in electronic medical record.  No pertanent information unless stated regarding to the chief complaint.   Review of Systems:  No headache, visual changes, nausea, vomiting, diarrhea, constipation, dizziness, abdominal pain, skin rash, fevers, chills, night sweats, weight loss, swollen lymph nodes, body aches, joint swelling, chest pain, shortness of breath, mood changes.  Positive muscle aches  Objective  Blood pressure 128/68, pulse 100, height 5\' 3"  (1.6 m), weight 141 lb (64 kg), SpO2 96 %.    General: No apparent distress alert and oriented x3 mood and affect normal, dressed appropriately.  HEENT: Pupils equal, extraocular movements intact  Respiratory: Patient's speak in full sentences and does not appear short of breath  Cardiovascular: 1+lower extremity edema, non tender, no erythema  Skin: Warm dry intact with no signs of infection or rash on extremities or on axial skeleton.  Abdomen: Soft nontender  Neuro: Cranial nerves II through XII are intact, neurovascularly intact in all extremities with 2+ DTRs and 2+ pulses.  Lymph: No lymphadenopathy of posterior or anterior cervical chain or axillae bilaterally.  Gait antalgic favoring the left foot MSK: Arthritic changes.   Patient's left foot exam shows the patient did have what appears to be a spontaneous rupture of a blood vessel on the dorsal aspect of the foot.  Patient does have 1-2+ pitting edema noted  of the lower extremity on the left which is seems to be more than the right.  No pain over the calf.  No pain in the popliteal area.  Pain more at the metatarsal heads more on the lateral aspect of the foot.  Limited musculoskeletal ultrasound shows significant soft tissue and dorsal foot swelling noted but no true bony abnormality other than patient's previous arthritis that has been noted.  No new neuroma, appears to have some possible small vascular injury noted but seems to be fully compressible. Impression and Recommendations:     This case required medical decision making of moderate complexity. The above documentation has been reviewed and  is accurate and complete Lyndal Pulley, DO       Note: This dictation was prepared with Dragon dictation along with smaller phrase technology. Any transcriptional errors that result from this process are unintentional.

## 2018-10-12 NOTE — Telephone Encounter (Signed)
Pt has already been scheduled for tomorrow at 3:45pm.

## 2018-10-12 NOTE — Telephone Encounter (Signed)
I returned her call.   She is c/o of her left foot and ankle being swollen.   This is a chronic problem in this foot.   Denies redness, warmth.    She sees Dr. Hulan Saas at [facility] for this.   I attempted to get her an appt with him however nothing was available as soon as she needed to be seen. I called the flow coordinator and spoke with Lauren [name].   She is going to give a message to Dr. Thompson Caul assistant, Lauren [name] and have her call Lauren [name] back with an appt. The pt was agreeable to this plan.  Triage notes sent to Dr. Thompson Caul nurse pool.  Reason for Disposition . [1] MILD swelling of both ankles (i.e., pedal edema) AND [2] is a chronic symptom (recurrent or ongoing AND present > 4 weeks)  Answer Assessment - Initial Assessment Questions 1. ONSET: "When did the swelling start?" (e.g., minutes, hours, days)     Started last THursday.    I get a shot in the top of my foot every 3 months from Dr. Charlann Boxer. 2. LOCATION: "What part of the leg is swollen?"  "Are both legs swollen or just one leg?"     My left foot and ankle were swollen.    From Thursday until today I've not had salt.   I slept with my left leg on a pillow and this morning it was fine but now it's starting to swell again.   This is an ongoing chronic problem. 3. SEVERITY: "How bad is the swelling?" (e.g., localized; mild, moderate, severe)  - Localized - small area of swelling localized to one leg  - MILD pedal edema - swelling limited to foot and ankle, pitting edema < 1/4 inch (6 mm) deep, rest and elevation eliminate most or all swelling  - MODERATE edema - swelling of lower leg to knee, pitting edema > 1/4 inch (6 mm) deep, rest and elevation only partially reduce swelling  - SEVERE edema - swelling extends above knee, facial or hand swelling present      See above 4. REDNESS: "Does the swelling look red or infected?"     No redness or warmth. 5. PAIN: "Is the swelling painful to touch?" If so, ask: "How painful  is it?"   (Scale 1-10; mild, moderate or severe)     Yes but it's the same place on top of my foot. 6. FEVER: "Do you have a fever?" If so, ask: "What is it, how was it measured, and when did it start?"      No 7. CAUSE: "What do you think is causing the leg swelling?"     This is a chronic problem. 8. MEDICAL HISTORY: "Do you have a history of heart failure, kidney disease, liver failure, or cancer?"     Not asked.  This is chronic problem. 9. RECURRENT SYMPTOM: "Have you had leg swelling before?" If so, ask: "When was the last time?" "What happened that time?"     Yes 10. OTHER SYMPTOMS: "Do you have any other symptoms?" (e.g., chest pain, difficulty breathing)       No 11. PREGNANCY: "Is there any chance you are pregnant?" "When was your last menstrual period?"       Not asked  Protocols used: LEG SWELLING AND EDEMA-A-AH

## 2018-10-13 ENCOUNTER — Other Ambulatory Visit (INDEPENDENT_AMBULATORY_CARE_PROVIDER_SITE_OTHER): Payer: PPO

## 2018-10-13 ENCOUNTER — Encounter: Payer: Self-pay | Admitting: Family Medicine

## 2018-10-13 ENCOUNTER — Ambulatory Visit: Payer: Self-pay

## 2018-10-13 ENCOUNTER — Ambulatory Visit: Payer: PPO | Admitting: Family Medicine

## 2018-10-13 VITALS — BP 128/68 | HR 100 | Ht 63.0 in | Wt 141.0 lb

## 2018-10-13 DIAGNOSIS — M79662 Pain in left lower leg: Secondary | ICD-10-CM | POA: Diagnosis not present

## 2018-10-13 DIAGNOSIS — M7989 Other specified soft tissue disorders: Secondary | ICD-10-CM | POA: Diagnosis not present

## 2018-10-13 DIAGNOSIS — M2550 Pain in unspecified joint: Secondary | ICD-10-CM

## 2018-10-13 DIAGNOSIS — M216X2 Other acquired deformities of left foot: Secondary | ICD-10-CM | POA: Diagnosis not present

## 2018-10-13 DIAGNOSIS — G5762 Lesion of plantar nerve, left lower limb: Secondary | ICD-10-CM

## 2018-10-13 DIAGNOSIS — M79672 Pain in left foot: Secondary | ICD-10-CM

## 2018-10-13 LAB — COMPREHENSIVE METABOLIC PANEL
ALBUMIN: 4.1 g/dL (ref 3.5–5.2)
ALT: 13 U/L (ref 0–35)
AST: 18 U/L (ref 0–37)
Alkaline Phosphatase: 60 U/L (ref 39–117)
BUN: 21 mg/dL (ref 6–23)
CHLORIDE: 105 meq/L (ref 96–112)
CO2: 29 mEq/L (ref 19–32)
Calcium: 9.1 mg/dL (ref 8.4–10.5)
Creatinine, Ser: 0.88 mg/dL (ref 0.40–1.20)
GFR: 62.5 mL/min (ref >60.00)
Glucose, Bld: 81 mg/dL (ref 70–99)
Potassium: 3.5 mEq/L (ref 3.5–5.1)
Sodium: 142 mEq/L (ref 135–145)
Total Bilirubin: 0.5 mg/dL (ref 0.2–1.2)
Total Protein: 6.8 g/dL (ref 6.0–8.3)

## 2018-10-13 LAB — CBC WITH DIFFERENTIAL/PLATELET
BASOS ABS: 0.1 10*3/uL (ref 0.0–0.1)
Basophils Relative: 1.1 % (ref 0.0–3.0)
Eosinophils Absolute: 0.1 10*3/uL (ref 0.0–0.7)
Eosinophils Relative: 1.3 % (ref 0.0–5.0)
HCT: 39.1 % (ref 36.0–46.0)
HEMOGLOBIN: 13.2 g/dL (ref 12.0–15.0)
Lymphocytes Relative: 28.4 % (ref 12.0–46.0)
Lymphs Abs: 1.5 10*3/uL (ref 0.7–4.0)
MCHC: 33.8 g/dL (ref 30.0–36.0)
MCV: 87.6 fl (ref 78.0–100.0)
Monocytes Absolute: 0.4 10*3/uL (ref 0.1–1.0)
Monocytes Relative: 6.7 % (ref 3.0–12.0)
Neutro Abs: 3.4 10*3/uL (ref 1.4–7.7)
Neutrophils Relative %: 62.5 % (ref 43.0–77.0)
Platelets: 251 10*3/uL (ref 150.0–400.0)
RBC: 4.46 Mil/uL (ref 3.87–5.11)
RDW: 13.6 % (ref 11.5–15.5)
WBC: 5.4 10*3/uL (ref 4.0–10.5)

## 2018-10-13 LAB — C-REACTIVE PROTEIN: CRP: <1 mg/dL (ref 0.5–20.0)

## 2018-10-13 LAB — IBC PANEL
Iron: 94 ug/dL (ref 42–145)
Saturation Ratios: 28.8 % (ref 20.0–50.0)
Transferrin: 233 mg/dL (ref 212.0–360.0)

## 2018-10-13 LAB — FERRITIN: FERRITIN: 164.6 ng/mL (ref 10.0–291.0)

## 2018-10-13 LAB — URIC ACID: Uric Acid, Serum: 3.7 mg/dL (ref 2.4–7.0)

## 2018-10-13 LAB — SEDIMENTATION RATE: Sed Rate: 25 mm/hr (ref 0–30)

## 2018-10-13 NOTE — Patient Instructions (Signed)
Good to see you  Looks like a spontaneous rupture of a blood vessel.  We will get labs to make sure it is all right Compression socks  Could look for recovery air compression leg as well  When you can please elevate feet above heart.  I will release labs in my chart, worsening pain or swelling please contact us or go to emergency room  See me again in 2 weeks

## 2018-10-13 NOTE — Assessment & Plan Note (Signed)
Patient has had spontaneous swelling like this previously.  At that time seem to be secondary to more of the underlying arthritis.  Today did not seem to be as severe limited ultrasound did not show any true clot.  We discussed the possibility of a Doppler which patient declined.  Patient will get labs though.  Recent illness and will make sure that patient does not have a thrombocytopenia.  Patient was recently on prednisone that could have contributed to more fragility of the blood vessels.  Do not feel that the neuroma is contributing to most of the discomfort and pain at this time but will consider the injection at follow-up in 2 weeks.  Patient will do compression until then.

## 2018-10-14 LAB — D-DIMER, QUANTITATIVE: D-Dimer, Quant: 0.75 mcg/mL FEU — ABNORMAL HIGH (ref <0.50)

## 2018-10-16 ENCOUNTER — Encounter: Payer: Self-pay | Admitting: Family Medicine

## 2018-10-16 DIAGNOSIS — M79605 Pain in left leg: Secondary | ICD-10-CM

## 2018-10-18 ENCOUNTER — Encounter: Payer: Self-pay | Admitting: Family Medicine

## 2018-10-19 ENCOUNTER — Ambulatory Visit
Admission: RE | Admit: 2018-10-19 | Discharge: 2018-10-19 | Disposition: A | Discharge status: Home / Self Care | Payer: PPO | Source: Ambulatory Visit | Attending: Family Medicine | Admitting: Family Medicine

## 2018-10-19 DIAGNOSIS — M7989 Other specified soft tissue disorders: Secondary | ICD-10-CM | POA: Diagnosis not present

## 2018-10-19 DIAGNOSIS — M79605 Pain in left leg: Secondary | ICD-10-CM

## 2018-10-19 DIAGNOSIS — M79662 Pain in left lower leg: Secondary | ICD-10-CM | POA: Diagnosis not present

## 2018-10-26 NOTE — Progress Notes (Signed)
[name] [address] [place], [place] [address] Phone: [phone] Subjective:     CC:   [id number]   10/13/2018: Patient has had spontaneous swelling like this previously.  At that time seem to be secondary to more of the underlying arthritis.  Today did not seem to be as severe limited ultrasound did not show any true clot.  We discussed the possibility of a Doppler which patient declined.  Patient will get labs though.  Recent illness and will make sure that patient does not have a thrombocytopenia.  Patient was recently on prednisone that could have contributed to more fragility of the blood vessels.  Do not feel that the neuroma is contributing to most of the discomfort and pain at this time but will consider the injection at follow-up in 2 weeks.  Patient will do compression until then.  Update 10/27/2018 Lauren [name] is a 78 y.o. female coming in with complaint of left foot pain. Patient states that her leg is not getting any better. She continues to have pain on the bottom of her foot. Pain is moving throughout the foot. Is wearing compression sleeve today.  Patient states that initially whenever she seems to stand up unfortunately started having worsening swelling no within minutes.  Feels that the compression has helped a little bit but not severe.  Patient did change her mind and did have a ABI that was abnormal Doppler unremarkable for any type of clot formation     Past Medical History:  Diagnosis Date  . Allergic rhinitis   . Chickenpox   . Elevated LDL cholesterol level 11/26/2015  . Gallstones   . Glaucoma   . HTN (hypertension)   . Hx of adenomatous and sessile serrated colonic polyps 09/02/2018  . Incontinence of urine in female 10/05/2015  . Kidney stones   . Migraines   . Urinary incontinence    Past Surgical History:  Procedure Laterality Date  . CHOLECYSTECTOMY  1995  . SACRAL NERVE STIMULATOR PLACEMENT  04/2018   To control  overactive bladder  . TONSILLECTOMY  1962  . TUBAL LIGATION     Social History   Socioeconomic History  . Marital status: Widowed    Spouse name: Not on file  . Number of children: 2  . Years of education: Not on file  . Highest education level: Not on file  Occupational History  . Occupation: retired  Scientific laboratory technician  . Financial resource strain: Not hard at all  . Food insecurity:    Worry: Never true    Inability: Never true  . Transportation needs:    Medical: No    Non-medical: No  Tobacco Use  . Smoking status: Former Smoker    Types: Cigarettes    Last attempt to quit: 08/06/1993    Years since quitting: 25.2  . Smokeless tobacco: Never Used  Substance and Sexual Activity  . Alcohol use: No    Alcohol/week: 0.0 standard drinks  . Drug use: No  . Sexual activity: Never  Lifestyle  . Physical activity:    Days per week: Not on file    Minutes per session: Not on file  . Stress: Not on file  Relationships  . Social connections:    Talks on phone: Not on file    Gets together: Not on file    Attends religious service: Not on file    Active member of club or organization: Not on file    Attends  meetings of clubs or organizations: Not on file    Relationship status: Widowed  Other Topics Concern  . Not on file  Social History Narrative   Widowed   1 son and 1 daughter   She is a retired Radiation protection practitioner   Former smoker   No alcohol or caffeine or drugs or any tobacco   Allergies  Allergen Reactions  . Eggs Or Egg-Derived Products   . Gabapentin Other (See Comments)    Unable to sleep  . Hydrochlorothiazide Other (See Comments)    Leg pain and aches  . Hydrocodone-Guaifenesin Other (See Comments)    Cant sleep   . Influenza Vaccines Other (See Comments)    Nausea and vomiting and stomach pain for 10 weeks  . Mobic [Meloxicam] Swelling  . Oxybutynin     Memory loss per patient  . Sulfa Antibiotics Other (See Comments)    Cold sweats, passing out   .  Codeine Rash    Cold sweats    Family History  Problem Relation Age of Onset  . Hematuria Father   . Heart disease Father   . Arthritis Father   . Heart disease Mother   . COPD Mother   . Atrial fibrillation Mother   . Hypertension Mother   . Colon cancer Daughter      Current Facility-Administered Medications (Endocrine & Metabolic):  .  betamethasone acetate-betamethasone sodium phosphate (CELESTONE) injection 3 mg  Current Outpatient Medications (Cardiovascular):  .  amLODipine (NORVASC) 5 MG tablet, TAKE 1 TABLET(5 MG) BY MOUTH DAILY         Current Outpatient Medications (Other):  Marland Kitchen  Ascorbic Acid (VITAMIN C) 100 MG tablet, Take 100 mg by mouth daily. Marland Kitchen  latanoprost (XALATAN) 0.005 % ophthalmic solution, Place 1 drop into both eyes at bedtime.      Past medical history, social, surgical and family history all reviewed in electronic medical record.  No pertanent information unless stated regarding to the chief complaint.   Review of Systems:  No headache, visual changes, nausea, vomiting, diarrhea, constipation, dizziness, abdominal pain, skin rash, fevers, chills, night sweats, weight loss, swollen lymph nodes, body aches, joint swelling,  chest pain, shortness of breath, mood changes.  Positive muscle aches  Objective  Blood pressure 130/80, pulse 82, height 5\' 3"  (1.6 m), weight 141 lb (64 kg), SpO2 98 %.    General: No apparent distress alert and oriented x3 mood and affect normal, dressed appropriately.  HEENT: Pupils equal, extraocular movements intact  Respiratory: Patient's speak in full sentences and does not appear short of breath  Cardiovascular: lower extremity edema, non tender, no erythema  Skin: Warm dry intact with no signs of infection or rash on extremities or on axial skeleton.  Abdomen: Soft nontender  Neuro: Cranial nerves II through XII are intact, neurovascularly intact in all extremities with 2+ DTRs and 2+ pulses.  Lymph: No  lymphadenopathy of posterior or anterior cervical chain or axillae bilaterally.  Gait antalgic MSK:  tender with mild limited range of motion and good stability and symmetric strength and tone of shoulders, elbows, wrist, hip, knee and bilaterally.  Left leg some mild swelling around the ankle more than the right.  Patient is tender to palpation over the fourth especially the midfoot.  Seems to have more swelling in this area as well.  Still continued pain between the metatarsal heads as well.  In the transverse arch noted and positive squeeze test with pain between the third and fourth metatarsals.  Musculoskeletal ultrasound was performed and interpreted by Lyndal Pulley  Limited ultrasound shows the patient does have a cystic formation noted more proximal to the midfoot but not quite to the ankle joint.  Mostly over the third and fourth metatarsal area.  Procedure: Real-time Ultrasound Guided Injection of left foot cyst aspiration Device: GE Logiq Q7 Ultrasound guided injection is preferred based studies that show increased duration, increased effect, greater accuracy, decreased procedural pain, increased response rate, and decreased cost with ultrasound guided versus blind injection.  Verbal informed consent obtained.  Time-out conducted.  Noted no overlying erythema, induration, or other signs of local infection.  Skin prepped in a sterile fashion.  Local anesthesia: Topical Ethyl chloride.  With sterile technique and under real time ultrasound guidance: An 18-gauge 1-1/2 inch needle patient was injected with 0.5 cc of 0.5% Marcaine and aspirated significant amount of gel-like material.  Then injected 1/2 cc of 0.5% Marcaine. Completed without difficulty  Pain immediately resolved suggesting accurate placement of the medication.  Advised to call if fevers/chills, erythema, induration, drainage, or persistent bleeding.  Images permanently stored and available for review in the ultrasound  unit.  Impression: Technically successful ultrasound guided injection.   Impression and Recommendations:     This case required medical decision making of moderate complexity. The above documentation has been reviewed and is accurate and complete Lyndal Pulley, DO       Note: This dictation was prepared with Dragon dictation along with smaller phrase technology. Any transcriptional errors that result from this process are unintentional.

## 2018-10-27 ENCOUNTER — Ambulatory Visit: Payer: Self-pay

## 2018-10-27 ENCOUNTER — Encounter: Payer: Self-pay | Admitting: Family Medicine

## 2018-10-27 ENCOUNTER — Ambulatory Visit: Payer: PPO | Admitting: Family Medicine

## 2018-10-27 VITALS — BP 130/80 | HR 82 | Ht 63.0 in | Wt 141.0 lb

## 2018-10-27 DIAGNOSIS — M71372 Other bursal cyst, left ankle and foot: Secondary | ICD-10-CM | POA: Diagnosis not present

## 2018-10-27 DIAGNOSIS — G5762 Lesion of plantar nerve, left lower limb: Secondary | ICD-10-CM | POA: Diagnosis not present

## 2018-10-27 DIAGNOSIS — M79672 Pain in left foot: Secondary | ICD-10-CM

## 2018-10-27 DIAGNOSIS — M79662 Pain in left lower leg: Secondary | ICD-10-CM

## 2018-10-27 DIAGNOSIS — M216X2 Other acquired deformities of left foot: Secondary | ICD-10-CM

## 2018-10-27 DIAGNOSIS — M7989 Other specified soft tissue disorders: Secondary | ICD-10-CM

## 2018-10-27 NOTE — Patient Instructions (Addendum)
God to see you  Lauren [name] is your friend Bonne Dolores draining the cyst today and hope it helps Go up in the size of the compression  See me again in 3ish weeks

## 2018-10-27 NOTE — Assessment & Plan Note (Addendum)
Aspirated significant amount of cyst material.  Seem to be somewhat ganglion or potentially synovial.  Patient given home exercises, encourage compression of the proper sizing.  Patient was to make certain changes.  Follow-up again in 4 to 6 weeks.  Could be causing some of distal pain.

## 2018-10-27 NOTE — Assessment & Plan Note (Signed)
Stable and continue to monitor may need injection again

## 2018-11-08 ENCOUNTER — Other Ambulatory Visit: Payer: Self-pay | Admitting: Family Medicine

## 2018-11-08 DIAGNOSIS — I10 Essential (primary) hypertension: Secondary | ICD-10-CM

## 2018-11-16 ENCOUNTER — Ambulatory Visit: Payer: PPO | Admitting: Family Medicine

## 2018-11-25 ENCOUNTER — Inpatient Hospital Stay: Admission: RE | Admit: 2018-11-25 | Payer: PPO | Source: Ambulatory Visit

## 2018-11-25 ENCOUNTER — Other Ambulatory Visit: Payer: PPO

## 2018-11-27 ENCOUNTER — Ambulatory Visit: Payer: PPO | Admitting: Family Medicine

## 2018-12-22 ENCOUNTER — Ambulatory Visit: Payer: PPO | Admitting: Family Medicine

## 2018-12-24 ENCOUNTER — Ambulatory Visit: Payer: PPO | Admitting: Family Medicine

## 2018-12-24 ENCOUNTER — Ambulatory Visit: Payer: Self-pay

## 2018-12-24 ENCOUNTER — Encounter: Payer: Self-pay | Admitting: Family Medicine

## 2018-12-24 VITALS — BP 160/68 | HR 106 | Ht 63.0 in | Wt 141.0 lb

## 2018-12-24 DIAGNOSIS — M19079 Primary osteoarthritis, unspecified ankle and foot: Secondary | ICD-10-CM | POA: Diagnosis not present

## 2018-12-24 DIAGNOSIS — M79672 Pain in left foot: Secondary | ICD-10-CM

## 2018-12-24 NOTE — Patient Instructions (Signed)
Good ot see you  Thank you so much for everything  See me again in when you need me Go (ugly) shoe shopping [employer]

## 2018-12-24 NOTE — Progress Notes (Signed)
[name] [address] [place], [place] [address] Phone: [phone] Subjective:     CC: Foot pain follow-up  CZY:[id number]   10/27/2018: Stable and continue to monitor may need injection again  Update 12/24/2018: Lauren [name] is a 78 y.o. female coming in with complaint of left foot pain. Has had intermittent swelling in the ankle and foot for 1 month. Is unable to wear shoes with laces as this increases pain over the transverse arch.  Patient states that the cyst aspiration was not helpful.  Having more pain in the foot itself.  Patient thinks it is more the joint than the cyst that we found previously.  Patient denies any fevers chills or any abnormal weight loss.  Has not noticed any swelling of the foot.      Past Medical History:  Diagnosis Date  . Allergic rhinitis   . Chickenpox   . Elevated LDL cholesterol level 11/26/2015  . Gallstones   . Glaucoma   . HTN (hypertension)   . Hx of adenomatous and sessile serrated colonic polyps 09/02/2018  . Incontinence of urine in female 10/05/2015  . Kidney stones   . Migraines   . Urinary incontinence    Past Surgical History:  Procedure Laterality Date  . CHOLECYSTECTOMY  1995  . SACRAL NERVE STIMULATOR PLACEMENT  04/2018   To control overactive bladder  . TONSILLECTOMY  1962  . TUBAL LIGATION     Social History   Socioeconomic History  . Marital status: Widowed    Spouse name: Not on file  . Number of children: 2  . Years of education: Not on file  . Highest education level: Not on file  Occupational History  . Occupation: retired  Scientific laboratory technician  . Financial resource strain: Not hard at all  . Food insecurity:    Worry: Never true    Inability: Never true  . Transportation needs:    Medical: No    Non-medical: No  Tobacco Use  . Smoking status: Former Smoker    Types: Cigarettes    Last attempt to quit: 08/06/1993    Years since quitting: 25.4  . Smokeless tobacco: Never Used   Substance and Sexual Activity  . Alcohol use: No    Alcohol/week: 0.0 standard drinks  . Drug use: No  . Sexual activity: Never  Lifestyle  . Physical activity:    Days per week: Not on file    Minutes per session: Not on file  . Stress: Not on file  Relationships  . Social connections:    Talks on phone: Not on file    Gets together: Not on file    Attends religious service: Not on file    Active member of club or organization: Not on file    Attends meetings of clubs or organizations: Not on file    Relationship status: Widowed  Other Topics Concern  . Not on file  Social History Narrative   Widowed   1 son and 1 daughter   She is a retired Radiation protection practitioner   Former smoker   No alcohol or caffeine or drugs or any tobacco   Allergies  Allergen Reactions  . Eggs Or Egg-Derived Products   . Gabapentin Other (See Comments)    Unable to sleep  . Hydrochlorothiazide Other (See Comments)    Leg pain and aches  . Hydrocodone-Guaifenesin Other (See Comments)    Cant sleep   . Influenza Vaccines Other (See Comments)  Nausea and vomiting and stomach pain for 10 weeks  . Mobic [Meloxicam] Swelling  . Oxybutynin     Memory loss per patient  . Sulfa Antibiotics Other (See Comments)    Cold sweats, passing out   . Codeine Rash    Cold sweats    Family History  Problem Relation Age of Onset  . Hematuria Father   . Heart disease Father   . Arthritis Father   . Heart disease Mother   . COPD Mother   . Atrial fibrillation Mother   . Hypertension Mother   . Colon cancer Daughter      Current Facility-Administered Medications (Endocrine & Metabolic):  .  betamethasone acetate-betamethasone sodium phosphate (CELESTONE) injection 3 mg  Current Outpatient Medications (Cardiovascular):  .  amLODipine (NORVASC) 5 MG tablet, TAKE 1 TABLET(5 MG) BY MOUTH DAILY         Current Outpatient Medications (Other):  Marland Kitchen  Ascorbic Acid (VITAMIN C) 100 MG tablet, Take 100 mg by mouth  daily. Marland Kitchen  latanoprost (XALATAN) 0.005 % ophthalmic solution, Place 1 drop into both eyes at bedtime.      Past medical history, social, surgical and family history all reviewed in electronic medical record.  No pertanent information unless stated regarding to the chief complaint.   Review of Systems:  No headache, visual changes, nausea, vomiting, diarrhea, constipation, dizziness, abdominal pain, skin rash, fevers, chills, night sweats, weight loss, swollen lymph nodes, body aches, joint swelling, muscle aches, chest pain, shortness of breath, mood changes.   Objective  Blood pressure (!) 160/68, pulse (!) 106, height 5\' 3"  (1.6 m), weight 141 lb (64 kg), SpO2 96 %. Systems examined below as of    General: No apparent distress alert and oriented x3 mood and affect normal, dressed appropriately.  HEENT: Pupils equal, extraocular movements intact  Respiratory: Patient's speak in full sentences and does not appear short of breath  Cardiovascular: No lower extremity edema, non tender, no erythema  Skin: Warm dry intact with no signs of infection or rash on extremities or on axial skeleton.  Abdomen: Soft nontender  Neuro: Cranial nerves II through XII are intact, neurovascularly intact in all extremities with 2+ DTRs and 2+ pulses.  Lymph: No lymphadenopathy of posterior or anterior cervical chain or axillae bilaterally.  Gait antalgic MSK:  tender with mild limited range of motion and good stability and symmetric strength and tone of shoulders, elbows, wrist, hip, knee and ankles bilaterally.  Arthritic changes of multiple joints  Foot exam shows rigid midfoot.  Significant arthritic changes of the midfoot.  Patient also has breakdown the transverse arch.  Positive squeeze test of the foot.  Tenderness still over the midfoot as well.  Neurovascular intact distally.  Procedure: Real-time Ultrasound Guided Injection of midfoot injection Device: GE Logiq Q7 Ultrasound guided injection is  preferred based studies that show increased duration, increased effect, greater accuracy, decreased procedural pain, increased response rate, and decreased cost with ultrasound guided versus blind injection.  Verbal informed consent obtained.  Time-out conducted.  Noted no overlying erythema, induration, or other signs of local infection.  Skin prepped in a sterile fashion.  Local anesthesia: Topical Ethyl chloride.  With sterile technique and under real time ultrasound guidance: With a 25-gauge half inch needle patient's midfoot above the third metatarsal was visualized.  Injected 0.5 cc of 0.5% Marcaine and 0.5 cc of Kenalog 40 mg/mL Completed without difficulty  Pain immediately resolved suggesting accurate placement of the medication.  Advised to call  if fevers/chills, erythema, induration, drainage, or persistent bleeding.  Images permanently stored and available for review in the ultrasound unit.  Impression: Technically successful ultrasound guided injection.    Impression and Recommendations:     This case required medical decision making of moderate complexity. The above documentation has been reviewed and is accurate and complete Lyndal Pulley, DO       Note: This dictation was prepared with Dragon dictation along with smaller phrase technology. Any transcriptional errors that result from this process are unintentional.        \

## 2018-12-24 NOTE — Assessment & Plan Note (Signed)
Patient given another injection today.  Tolerated the procedure well.  Discussed icing regimen and home exercise.  Discussed which activities to do which was to avoid.  Increase activity as tolerated.  Discussed again about more rigid soled shoe that I think will be helpful.  Follow-up again 12 weeks

## 2018-12-25 ENCOUNTER — Encounter: Payer: Self-pay | Admitting: Family Medicine

## 2018-12-25 ENCOUNTER — Ambulatory Visit: Payer: PPO | Admitting: Family Medicine

## 2019-01-05 ENCOUNTER — Ambulatory Visit: Payer: PPO

## 2019-02-06 ENCOUNTER — Ambulatory Visit
Admission: RE | Admit: 2019-02-06 | Discharge: 2019-02-06 | Disposition: A | Discharge status: Home / Self Care | Payer: PPO | Source: Ambulatory Visit | Attending: Family Medicine | Admitting: Family Medicine

## 2019-02-06 ENCOUNTER — Other Ambulatory Visit: Payer: Self-pay

## 2019-02-06 DIAGNOSIS — Z1231 Encounter for screening mammogram for malignant neoplasm of breast: Secondary | ICD-10-CM | POA: Diagnosis not present

## 2019-02-06 DIAGNOSIS — Z1239 Encounter for other screening for malignant neoplasm of breast: Secondary | ICD-10-CM

## 2019-02-24 ENCOUNTER — Other Ambulatory Visit: Payer: Self-pay

## 2019-02-24 ENCOUNTER — Ambulatory Visit: Payer: Self-pay

## 2019-02-24 ENCOUNTER — Encounter: Payer: Self-pay | Admitting: Family Medicine

## 2019-02-24 ENCOUNTER — Ambulatory Visit: Payer: PPO | Admitting: Family Medicine

## 2019-02-24 VITALS — BP 142/82 | HR 75 | Ht 63.0 in | Wt 142.0 lb

## 2019-02-24 DIAGNOSIS — M79672 Pain in left foot: Secondary | ICD-10-CM

## 2019-02-24 DIAGNOSIS — M19079 Primary osteoarthritis, unspecified ankle and foot: Secondary | ICD-10-CM

## 2019-02-24 NOTE — Patient Instructions (Signed)
Good to see you  If the steroids do not work may want to look into the PRP  Otherwise keep trucking along  See me again in 8ish weeks Be safe!

## 2019-02-24 NOTE — Progress Notes (Signed)
[name] [address] [place], [place] [address] Phone: [phone] Subjective:   Lauren [name], am serving as a scribe for Dr. Hulan [name].     CC: Left foot pain  [id number]  Lauren [name] is a 78 y.o. female coming in with complaint of left foot swelling. Begins in the morning and by lunch the swelling goes up into her calf. Pain is unchanged in left foot. Notes a knot distal to the 5th digit. Pain is constant.  Patient states he was in severe enough to stop smoking on a regular basis.  Patient is fairly rigid.     Past Medical History:  Diagnosis Date  . Allergic rhinitis   . Chickenpox   . Elevated LDL cholesterol level 11/26/2015  . Gallstones   . Glaucoma   . HTN (hypertension)   . Hx of adenomatous and sessile serrated colonic polyps 09/02/2018  . Incontinence of urine in female 10/05/2015  . Kidney stones   . Migraines   . Urinary incontinence    Past Surgical History:  Procedure Laterality Date  . CHOLECYSTECTOMY  1995  . SACRAL NERVE STIMULATOR PLACEMENT  04/2018   To control overactive bladder  . TONSILLECTOMY  1962  . TUBAL LIGATION     Social History   Socioeconomic History  . Marital status: Widowed    Spouse name: Not on file  . Number of children: 2  . Years of education: Not on file  . Highest education level: Not on file  Occupational History  . Occupation: retired  Scientific laboratory technician  . Financial resource strain: Not hard at all  . Food insecurity    Worry: Never true    Inability: Never true  . Transportation needs    Medical: [name]    Non-medical: [name]  Tobacco Use  . Smoking status: Former Smoker    Types: Cigarettes    Quit date: 08/06/1993    Years since quitting: 25.5  . Smokeless tobacco: Never Used  Substance and Sexual Activity  . Alcohol use: [name]    Alcohol/week: 0.0 standard drinks  . Drug use: [name]  . Sexual activity: Never  Lifestyle  . Physical activity    Days per week: Not on file    Minutes  per session: Not on file  . Stress: Not on file  Relationships  . Social Herbalist on phone: Not on file    Gets together: Not on file    Attends religious service: Not on file    Active member of club or organization: Not on file    Attends meetings of clubs or organizations: Not on file    Relationship status: Widowed  Other Topics Concern  . Not on file  Social History Narrative   Widowed   1 son and 1 daughter   She is a retired Radiation protection practitioner   Former smoker   [name] alcohol or caffeine or drugs or any tobacco   Allergies  Allergen Reactions  . Eggs Or Egg-Derived Products   . Gabapentin Other (See Comments)    Unable to sleep  . Hydrochlorothiazide Other (See Comments)    Leg pain and aches  . Hydrocodone-Guaifenesin Other (See Comments)    Cant sleep   . Influenza Vaccines Other (See Comments)    Nausea and vomiting and stomach pain for 10 weeks  . Mobic [Meloxicam] Swelling  . Oxybutynin     Memory loss per patient  . Sulfa Antibiotics Other (  See Comments)    Cold sweats, passing out   . Codeine Rash    Cold sweats    Family History  Problem Relation Age of Onset  . Hematuria Father   . Heart disease Father   . Arthritis Father   . Heart disease Mother   . COPD Mother   . Atrial fibrillation Mother   . Hypertension Mother   . Colon cancer Daughter      Current Facility-Administered Medications (Endocrine & Metabolic):  .  betamethasone acetate-betamethasone sodium phosphate (CELESTONE) injection 3 mg  Current Outpatient Medications (Cardiovascular):  .  amLODipine (NORVASC) 5 MG tablet, TAKE 1 TABLET(5 MG) BY MOUTH DAILY         Current Outpatient Medications (Other):  Marland Kitchen  Ascorbic Acid (VITAMIN C) 100 MG tablet, Take 100 mg by mouth daily. Marland Kitchen  latanoprost (XALATAN) 0.005 % ophthalmic solution, Place 1 drop into both eyes at bedtime.      Past medical history, social, surgical and family history all reviewed in electronic medical record.   [name] pertanent information unless stated regarding to the chief complaint.   Review of Systems:  [name] headache, visual changes, nausea, vomiting, diarrhea, constipation, dizziness, abdominal pain, skin rash, fevers, chills, night sweats, weight loss, swollen lymph nodes, body aches, joint swelling,chest pain, shortness of breath, mood changes.  Positive muscle aches  Objective  Blood pressure (!) 142/82, pulse 75, height 5\' 3"  (1.6 m), weight 142 lb (64.4 kg), SpO2 95 %.    General: [name] apparent distress alert and oriented x3 mood and affect normal, dressed appropriately.  HEENT: Pupils equal, extraocular movements intact  Respiratory: Patient's speak in full sentences and does not appear short of breath  Cardiovascular: [name] lower extremity edema, non tender, [name] erythema  Skin: Warm dry intact with [name] signs of infection or rash on extremities or on axial skeleton.  Abdomen: Soft nontender  Neuro: Cranial nerves II through XII are intact, neurovascularly intact in all extremities with 2+ DTRs and 2+ pulses.  Lymph: [name] lymphadenopathy of posterior or anterior cervical chain or axillae bilaterally.  Gait antalgic.  MSK:  tender with mild limited range of motion and good stability and symmetric strength and tone of shoulders, elbows, wrist, hip, knee bilaterally.  Foot exam shows the patient does have breakdown of the longitudinal and transverse arch a little bit.  Rigid midfoot noted.  Severe tenderness over the third metatarsal, fourth metatarsal as well.  Callus formation on the plantar aspect of the fifth metatarsal head  Procedure: Real-time Ultrasound Guided Injection of third left side metatarsal joint Device: GE Logiq Q7 Ultrasound guided injection is preferred based studies that show increased duration, increased effect, greater accuracy, decreased procedural pain, increased response rate, and decreased cost with ultrasound guided versus blind injection.  Verbal informed consent obtained.   Time-out conducted.  Noted [name] overlying erythema, induration, or other signs of local infection.  Skin prepped in a sterile fashion.  Local anesthesia: Topical Ethyl chloride.  With sterile technique and under real time ultrasound guidance: With a 25-gauge half inch needle injected with 0.5 cc of 0.5% Marcaine and 0.5 cc of Kenalog 40 mg/mL Completed without difficulty  Pain immediately resolved suggesting accurate placement of the medication.  Advised to call if fevers/chills, erythema, induration, drainage, or persistent bleeding.  Images permanently stored and available for review in the ultrasound unit.  Impression: Technically successful ultrasound guided injection.   Impression and Recommendations:     This case required medical decision making of  moderate complexity. The above documentation has been reviewed and is accurate and complete Lyndal Pulley, DO       Note: This dictation was prepared with Dragon dictation along with smaller phrase technology. Any transcriptional errors that result from this process are unintentional.

## 2019-02-24 NOTE — Assessment & Plan Note (Signed)
Repeat injection given today.  Third tarsal phalangeal space.  Tolerated the procedure well.  Did have a very mild neuroma.  Still has some abnormal vascularity but has had MRI showing only severe arthritis.  Patient is wearing a more rigid shoes.  I think this is beneficial.  We discussed the possibility of PRP.  Follow-up again in 8 to 10 weeks

## 2019-03-17 DIAGNOSIS — H401131 Primary open-angle glaucoma, bilateral, mild stage: Secondary | ICD-10-CM | POA: Diagnosis not present

## 2019-03-26 ENCOUNTER — Ambulatory Visit
Admission: RE | Admit: 2019-03-26 | Discharge: 2019-03-26 | Disposition: A | Discharge status: Home / Self Care | Payer: PPO | Source: Ambulatory Visit | Attending: Family Medicine | Admitting: Family Medicine

## 2019-03-26 ENCOUNTER — Other Ambulatory Visit: Payer: Self-pay

## 2019-03-26 DIAGNOSIS — M810 Age-related osteoporosis without current pathological fracture: Secondary | ICD-10-CM | POA: Diagnosis not present

## 2019-03-26 DIAGNOSIS — Z780 Asymptomatic menopausal state: Secondary | ICD-10-CM | POA: Diagnosis not present

## 2019-03-26 DIAGNOSIS — M8588 Other specified disorders of bone density and structure, other site: Secondary | ICD-10-CM | POA: Diagnosis not present

## 2019-03-29 ENCOUNTER — Telehealth: Payer: Self-pay | Admitting: Family Medicine

## 2019-03-29 DIAGNOSIS — Z20828 Contact with and (suspected) exposure to other viral communicable diseases: Secondary | ICD-10-CM

## 2019-03-29 DIAGNOSIS — Z20822 Contact with and (suspected) exposure to covid-19: Secondary | ICD-10-CM

## 2019-03-29 NOTE — Telephone Encounter (Signed)
Copied from [place] [id number]. Topic: Quick Communication - See Telephone Encounter >> Mar 29, 2019 12:18 PM Nils Flack wrote: CRM for notification. See Telephone encounter for: 03/29/19. Pt would like to have call back about bone density -  Also her son was positive for covid, and pt was around him briefly, she has also been around his daughter - she has no symptoms Pt wants to know what to do, does she need to be quarantined?  Cb is [phone]

## 2019-03-30 ENCOUNTER — Ambulatory Visit: Payer: PPO | Admitting: Family Medicine

## 2019-03-30 NOTE — Telephone Encounter (Signed)
Patient says she will contact PCP after she finished with quarantine and set up an appointment to discuss treatment options for osteoporosis. Patient ask if PCP would place order for testing and she will go on Friday to [facility] location for testing.

## 2019-03-30 NOTE — Telephone Encounter (Signed)
Ordered

## 2019-03-30 NOTE — Telephone Encounter (Signed)
Patient son tested positive for COVID last Friday and she was around him on Thursday before she sat across from him about 5 feet a part no mask while eating but did have on before and after.  Patient is having no symptoms right now.   Patient Bone density results are in chart from 02/26/19 she is asking should she increase calcium? Advised to wai to hear from PCP.

## 2019-03-30 NOTE — Telephone Encounter (Signed)
Pt is checking on the below message

## 2019-03-30 NOTE — Telephone Encounter (Signed)
She should quarantine at home given her exposure. She should not leave the house and should not have any visitors. If she needs any food or other materials she should have someone bring them to her house and drop them at the door without the door being open. We could arrange for testing for her 7 days after her exposure if she is willing to be tested. She will need to quarantine for 14 days after her exposure to ensure that she does not develop any symptoms. There is a result note regarding the bone density test. Please see that to really it to her. Thanks.

## 2019-03-30 NOTE — Telephone Encounter (Signed)
Patient would love to participate with the COVID 19 monitoring.

## 2019-03-30 NOTE — Telephone Encounter (Signed)
Order placed. Please see if she would like to take part in the [place] mychart monitoring program. Thanks.

## 2019-03-31 ENCOUNTER — Encounter: Payer: Self-pay | Admitting: Family Medicine

## 2019-04-01 ENCOUNTER — Telehealth: Payer: Self-pay

## 2019-04-01 ENCOUNTER — Encounter: Payer: Self-pay | Admitting: Family Medicine

## 2019-04-01 NOTE — Telephone Encounter (Signed)
I called the patient to see if she wanted to schedule with the provider, patient stated she was exposed to covid and she would be tested on Friday and she would call in to schedule once her results are back.  Davonta Stroot,cma

## 2019-04-02 ENCOUNTER — Other Ambulatory Visit: Payer: Self-pay

## 2019-04-02 DIAGNOSIS — Z20822 Contact with and (suspected) exposure to covid-19: Secondary | ICD-10-CM

## 2019-04-03 LAB — NOVEL CORONAVIRUS, NAA: SARS-CoV-2, NAA: NOT DETECTED

## 2019-04-12 ENCOUNTER — Ambulatory Visit (INDEPENDENT_AMBULATORY_CARE_PROVIDER_SITE_OTHER): Payer: PPO

## 2019-04-12 ENCOUNTER — Other Ambulatory Visit: Payer: Self-pay

## 2019-04-12 DIAGNOSIS — Z0000 Encounter for general adult medical examination without abnormal findings: Secondary | ICD-10-CM

## 2019-04-12 NOTE — Patient Instructions (Addendum)
  Lauren [name] , Thank you for taking time to come for your Medicare Wellness Visit. I appreciate your ongoing commitment to your health goals. Please review the following plan we discussed and let me know if I can assist you in the future.   These are the goals we discussed: Goals    . Maintain Healthy Lifestyle     Stay active Stay hydrated  Healthy diet ( monitor chocolate intake)       This is a list of the screening recommended for you and due dates:  Health Maintenance  Topic Date Due  . Flu Shot  03/27/2019  . Colon Cancer Screening  08/28/2019  . Tetanus Vaccine  08/31/2019  . DEXA scan (bone density measurement)  Completed  . Pneumonia vaccines  Completed

## 2019-04-12 NOTE — Progress Notes (Signed)
Subjective:   Lauren [name] is a 78 y.o. female who presents for Medicare Annual (Subsequent) preventive examination.  Review of Systems:  No ROS.  Medicare Wellness Virtual Visit.  Visual/audio telehealth visit, UTA vital signs.   See social history for additional risk factors.   Cardiac Risk Factors include: advanced age (>55men, >25 women);hypertension     Objective:     Vitals: There were no vitals taken for this visit.  There is no height or weight on file to calculate BMI.  Advanced Directives 04/12/2019 12/10/2017 11/25/2016 11/08/2015  Does Patient Have a Medical Advance Directive? Yes Yes Yes Yes  Type of Paramedic of Blue;Living will Living will Living will;Healthcare Power of [place];Living will  Does patient want to make changes to medical advance directive? No - Patient declined No - Patient declined No - Patient declined No - Patient declined  Copy of [place] in Chart? No - copy requested - No - copy requested Yes    Tobacco Social History   Tobacco Use  Smoking Status Former Smoker  . Types: Cigarettes  . Quit date: 08/06/1993  . Years since quitting: 25.6  Smokeless Tobacco Never Used     Counseling given: Not Answered   Clinical Intake:  Pre-visit preparation completed: Yes        Diabetes: No  How often do you need to have someone help you when you read instructions, pamphlets, or other written materials from your doctor or pharmacy?: 1 - Never  Interpreter Needed?: No     Past Medical History:  Diagnosis Date  . Allergic rhinitis   . Chickenpox   . Elevated LDL cholesterol level 11/26/2015  . Gallstones   . Glaucoma   . HTN (hypertension)   . Hx of adenomatous and sessile serrated colonic polyps 09/02/2018  . Incontinence of urine in female 10/05/2015  . Kidney stones   . Migraines   . Urinary incontinence    Past Surgical History:  Procedure Laterality Date   . CHOLECYSTECTOMY  1995  . SACRAL NERVE STIMULATOR PLACEMENT  04/2018   To control overactive bladder  . TONSILLECTOMY  1962  . TUBAL LIGATION     Family History  Problem Relation Age of Onset  . Hematuria Father   . Heart disease Father   . Arthritis Father   . Heart disease Mother   . COPD Mother   . Atrial fibrillation Mother   . Hypertension Mother   . Colon cancer Daughter    Social History   Socioeconomic History  . Marital status: Widowed    Spouse name: Not on file  . Number of children: 2  . Years of education: Not on file  . Highest education level: Not on file  Occupational History  . Occupation: retired  Scientific laboratory technician  . Financial resource strain: Not hard at all  . Food insecurity    Worry: Never true    Inability: Never true  . Transportation needs    Medical: No    Non-medical: No  Tobacco Use  . Smoking status: Former Smoker    Types: Cigarettes    Quit date: 08/06/1993    Years since quitting: 25.6  . Smokeless tobacco: Never Used  Substance and Sexual Activity  . Alcohol use: No    Alcohol/week: 0.0 standard drinks  . Drug use: No  . Sexual activity: Never  Lifestyle  . Physical activity    Days per week: Not  on file    Minutes per session: Not on file  . Stress: Not on file  Relationships  . Social Herbalist on phone: Not on file    Gets together: Not on file    Attends religious service: Not on file    Active member of club or organization: Not on file    Attends meetings of clubs or organizations: Not on file    Relationship status: Widowed  Other Topics Concern  . Not on file  Social History Narrative   Widowed   1 son and 1 daughter   She is a retired Radiation protection practitioner   Former smoker   No alcohol or caffeine or drugs or any tobacco    Outpatient Encounter Medications as of 04/12/2019  Medication Sig  . amLODipine (NORVASC) 5 MG tablet TAKE 1 TABLET(5 MG) BY MOUTH DAILY  . latanoprost (XALATAN) 0.005 % ophthalmic  solution Place 1 drop into both eyes at bedtime.   . Ascorbic Acid (VITAMIN C) 100 MG tablet Take 100 mg by mouth daily.   Facility-Administered Encounter Medications as of 04/12/2019  Medication  . betamethasone acetate-betamethasone sodium phosphate (CELESTONE) injection 3 mg    Activities of Daily Living In your present state of health, do you have any difficulty performing the following activities: 04/12/2019  Hearing? N  Vision? N  Difficulty concentrating or making decisions? N  Walking or climbing stairs? N  Dressing or bathing? N  Doing errands, shopping? N  Preparing Food and eating ? N  Using the Toilet? N  In the past six months, have you accidently leaked urine? N  Do you have problems with loss of bowel control? N  Managing your Medications? N  Managing your Finances? N  Housekeeping or managing your Housekeeping? N  Some recent data might be hidden    Patient Care Team: Leone Haven, MD as PCP - General (Family Medicine)    Assessment:   This is a routine wellness examination for Lauren [name].  I connected with patient 04/12/19 at  8:30 AM EDT by an audio enabled telemedicine application and verified that I am speaking with the correct person using two identifiers. Patient stated full name and DOB. Patient gave permission to continue with virtual visit. Patient's location was at home and Nurse's location was at [place] office.   Health Maintenance Due: Influenza vaccine 2020- discussed; to be completed in season with doctor or local pharmacy.   Dexa Scan- 02/2019-[facility] Mammogram- 02/2019-[facility] Update all pending maintenance due as appropriate.   See completed HM at the end of note.   Eye- Visual acuity not assessed. Virtual visit. Wears reading glasses.  Followed by their ophthalmologist every 12 months. Glaucoma; drops in use.  Dental- dentures.  Hearing-demonstrates normal hearing during visit.  Safety  Patient feels safe at home-  yes Patient does have smoke detectors at home- yes Patient does wear sunscreen or protective clothing when in direct sunlight - yes Patient does wear seat belt when in a moving vehicle - yes Patient drives- yes Adequate lighting in walkways free from debris- yes Grab bars and handrails used as appropriate- yes Ambulates without any assistive device. Alarm system- yes  Social  Alcohol intake - no    Smoking history- former    Smokers in home? None Illicit drug use? None  Covid-19 precautions and sickness symptoms discussed. Wears mask as appropriate.   Activities of Daily Living Patient denies needing assistance with: household chores, feeding themselves, getting from  bed to chair, getting to the toilet, bathing/showering, dressing, managing money, or preparing meals.  No failures at ADL's or IADL's.   Memory: Patient is alert. Patient denies difficulty focusing or concentrating. Correctly identified the president of the [place], season and recall. Patient likes to sew for brain stimulation.  BMI- discussed the importance of a healthy diet, water intake and the benefits of aerobic exercise.  Educational material provided.  Physical activity- yard work, active around the home  Diet: healthy; limited red meat Water: good intake Caffeine: 3 blocks of hershey bar daily  Advanced Directive End of life planning; Advance aging; Advanced directives discussed.  Copy of current HCPOA/Living Will requested.    Other Providers Patient Care Team: Leone Haven, MD as PCP - General (Family Medicine)  Urologist- Dr. Danae Chen Sports Medicine- Dr. Tamala Julian  Exercise Activities and Dietary recommendations Current Exercise Habits: Home exercise routine, Intensity: Mild  Goals    . Maintain Healthy Lifestyle     Stay active Stay hydrated  Healthy diet ( monitor chocolate intake)       Fall Risk Fall Risk  04/12/2019 09/07/2018 12/10/2017 07/09/2017 11/25/2016  Falls in the past year? 0  1 No No No  Number falls in past yr: - 0 - - -  Injury with Fall? - 0 - - -   Timed Get Up and Go performed: no, virtual visit.  Depression Screen PHQ 2/9 Scores 04/12/2019 09/07/2018 12/10/2017 07/09/2017  PHQ - 2 Score 0 0 0 0    Depression- PHQ 2 &9 complete.  There are no signs/symptoms or verbal communication regarding depression, irritability, anhedonia, sadness/tearfullness.   Cognitive Function MMSE - Mini Mental State Exam 07/02/2017 11/25/2016 11/08/2015  Orientation to time 5 5 5   Orientation to Place 5 5 5   Registration 3 3 3   Attention/ Calculation 3 5 5   Recall 3 3 3   Language- name 2 objects 2 2 2   Language- repeat 1 1 1   Language- follow 3 step command 3 3 3   Language- read & follow direction 1 1 1   Write a sentence 1 1 1   Copy design 1 1 1   Total score 28 30 30      6CIT Screen 04/12/2019 12/10/2017  What Year? 0 points 0 points  What month? 0 points 0 points  What time? 0 points 0 points  Count back from 20 0 points 0 points  Months in reverse 0 points 0 points  Repeat phrase 0 points 0 points  Total Score 0 0    Immunization History  Administered Date(s) Administered  . Pneumococcal Conjugate-13 12/10/2017  . Pneumococcal Polysaccharide-23 10/05/2015, 10/14/2016   Immunizations The following Immunizations were discussed: Influenza, shingles, pneumonia, and tetanus.   Screening Tests Health Maintenance  Topic Date Due  . INFLUENZA VACCINE  03/27/2019  . COLONOSCOPY  08/28/2019  . TETANUS/TDAP  08/31/2019  . DEXA SCAN  Completed  . PNA vac Low Risk Adult  Completed      Plan:   Keep all routine maintenance appointments.  Follow up 05/17/19  Medicare Attestation I have personally reviewed: The patient's medical and social history Their use of alcohol, tobacco or illicit drugs Their current medications and supplements The patient's functional ability including ADLs,fall risks, home safety risks, cognitive, and hearing and visual impairment Diet  and physical activities Evidence for depression  In addition, I have reviewed and discussed with patient certain preventive protocols, quality metrics, and best practice recommendations. A written personalized care plan for preventive services as well  as general preventive health recommendations were provided to patient.     Varney Biles, LPN  [id number]

## 2019-04-13 NOTE — Progress Notes (Signed)
I have reviewed the above note and agree.  Eric Sonnenberg, M.D.  

## 2019-04-28 ENCOUNTER — Ambulatory Visit: Payer: PPO | Admitting: Family Medicine

## 2019-05-13 ENCOUNTER — Other Ambulatory Visit: Payer: Self-pay

## 2019-05-17 ENCOUNTER — Encounter: Payer: Self-pay | Admitting: Family Medicine

## 2019-05-17 ENCOUNTER — Ambulatory Visit (INDEPENDENT_AMBULATORY_CARE_PROVIDER_SITE_OTHER): Payer: PPO | Admitting: Family Medicine

## 2019-05-17 ENCOUNTER — Other Ambulatory Visit: Payer: Self-pay

## 2019-05-17 VITALS — BP 122/75 | HR 75 | Temp 98.7°F | Ht 62.0 in | Wt 150.8 lb

## 2019-05-17 DIAGNOSIS — I10 Essential (primary) hypertension: Secondary | ICD-10-CM | POA: Diagnosis not present

## 2019-05-17 DIAGNOSIS — E538 Deficiency of other specified B group vitamins: Secondary | ICD-10-CM

## 2019-05-17 DIAGNOSIS — Z800 Family history of malignant neoplasm of digestive organs: Secondary | ICD-10-CM | POA: Diagnosis not present

## 2019-05-17 DIAGNOSIS — H6983 Other specified disorders of Eustachian tube, bilateral: Secondary | ICD-10-CM | POA: Diagnosis not present

## 2019-05-17 DIAGNOSIS — E7800 Pure hypercholesterolemia, unspecified: Secondary | ICD-10-CM | POA: Diagnosis not present

## 2019-05-17 DIAGNOSIS — M810 Age-related osteoporosis without current pathological fracture: Secondary | ICD-10-CM | POA: Diagnosis not present

## 2019-05-17 LAB — LIPID PANEL
Cholesterol: 195 mg/dL (ref 0–200)
HDL: 77.5 mg/dL (ref >39.00)
LDL Cholesterol: 106 mg/dL — ABNORMAL HIGH (ref 0–99)
NonHDL: 117.79
Total CHOL/HDL Ratio: 3
Triglycerides: 60 mg/dL (ref 0.0–149.0)
VLDL: 12 mg/dL (ref 0.0–40.0)

## 2019-05-17 LAB — COMPREHENSIVE METABOLIC PANEL
ALT: 10 U/L (ref 0–35)
AST: 18 U/L (ref 0–37)
Albumin: 4.2 g/dL (ref 3.5–5.2)
Alkaline Phosphatase: 55 U/L (ref 39–117)
BUN: 18 mg/dL (ref 6–23)
CO2: 30 mEq/L (ref 19–32)
Calcium: 9.6 mg/dL (ref 8.4–10.5)
Chloride: 107 mEq/L (ref 96–112)
Creatinine, Ser: 0.8 mg/dL (ref 0.40–1.20)
GFR: 69.66 mL/min (ref >60.00)
Glucose, Bld: 95 mg/dL (ref 70–99)
Potassium: 4.1 mEq/L (ref 3.5–5.1)
Sodium: 143 mEq/L (ref 135–145)
Total Bilirubin: 0.6 mg/dL (ref 0.2–1.2)
Total Protein: 6.2 g/dL (ref 6.0–8.3)

## 2019-05-17 LAB — VITAMIN D 25 HYDROXY (VIT D DEFICIENCY, FRACTURES): VITD: 17.98 ng/mL — ABNORMAL LOW (ref 30.00–100.00)

## 2019-05-17 LAB — VITAMIN B12: Vitamin B-12: 200 pg/mL — ABNORMAL LOW (ref 211–911)

## 2019-05-17 MED ORDER — FLUTICASONE PROPIONATE 50 MCG/ACT NA SUSP: 2.0000 | Freq: Every day | NASAL | 6 refills | Status: DC

## 2019-05-17 NOTE — Patient Instructions (Signed)
Nice to see you. We will get lab work today and contact you with the results. Please try the Flonase and see if that helps with your ears. There are vitamin C gelcaps that you should be able to order over the Internet.

## 2019-05-17 NOTE — Progress Notes (Signed)
  Tommi Rumps, MD Phone: [phone]  Lauren [name] is a 78 y.o. female who presents today for follow-up.  Osteoporosis: Seen on recent DEXA scan.  She does not take vitamin D.  She was on Fosamax though had lots of constipation.  Ear itching: Patient notes this is been going on intermittently for quite some time.  She wonders if there is wax.  Sometimes her ears feel full.  No tinnitus or hearing loss.  Hypertension: Typically 120s-137.  Taking amlodipine.  No chest pain, shortness of breath, or edema.  Low serum B12: Borderline levels previously though methylmalonic acid and homocystine testing did not reveal a B12 deficiency.  She wonders about B12 shots.  Family history of colon cancer: Her daughter was diagnosed with a cancerous polyp.  She underwent surgery and chemo.  She has done well.  The patient is scheduled for a follow-up colonoscopy in January as she had 11 polyps that were benign on most recent colonoscopy.  Social History   Tobacco Use  Smoking Status Former Smoker  . Types: Cigarettes  . Quit date: 08/06/1993  . Years since quitting: 25.8  Smokeless Tobacco Never Used     ROS see history of present illness  Objective  Physical Exam Vitals:   05/17/19 0904  BP: 122/75  Pulse: 75  Temp: 98.7 F (37.1 C)  SpO2: 99%    BP Readings from Last 3 Encounters:  05/17/19 122/75  02/24/19 (!) 142/82  12/24/18 (!) 160/68   Wt Readings from Last 3 Encounters:  05/17/19 150 lb 12.8 oz (68.4 kg)  02/24/19 142 lb (64.4 kg)  12/24/18 141 lb (64 kg)    Physical Exam Constitutional:      General: She is not in acute distress.    Appearance: She is not diaphoretic.  HENT:     Right Ear: Tympanic membrane and ear canal normal.     Left Ear: Tympanic membrane and ear canal normal.  Cardiovascular:     Rate and Rhythm: Normal rate and regular rhythm.     Heart sounds: Normal heart sounds.  Pulmonary:     Effort: Pulmonary effort is normal.     Breath  sounds: Normal breath sounds.  Musculoskeletal:     Right lower leg: No edema.     Left lower leg: No edema.  Skin:    General: Skin is warm and dry.  Neurological:     Mental Status: She is alert.      Assessment/Plan: Please see individual problem list.  HTN (hypertension) Adequately controlled.  Continue current regimen.  Osteoporosis Bone density scan with osteoporosis.  We will check a vitamin D level and then determine if we could try Prolia.  Eustachian tube dysfunction, bilateral I suspect your itching is related to this.  She will trial Flonase.  Low serum vitamin B12 Check B12.  Consider supplementation if low.  Family history of colon cancer In her daughter.  She has a colonoscopy set up for January.   Orders Placed This Encounter  Procedures  . B12  . Vitamin D (25 hydroxy)  . Comp Met (CMET)  . Lipid panel    Meds ordered this encounter  Medications  . fluticasone (FLONASE) 50 MCG/ACT nasal spray    Sig: Place 2 sprays into both nostrils daily.    Dispense:  16 g    Refill:  [place], MD [place]

## 2019-05-20 ENCOUNTER — Other Ambulatory Visit: Payer: Self-pay | Admitting: Family Medicine

## 2019-05-20 DIAGNOSIS — E559 Vitamin D deficiency, unspecified: Secondary | ICD-10-CM

## 2019-05-20 MED ORDER — VITAMIN D (ERGOCALCIFEROL) 1.25 MG (50000 UNIT) PO CAPS: 50000.0000 [IU] | ORAL_CAPSULE | ORAL | 0 refills | Status: DC

## 2019-05-22 DIAGNOSIS — H6983 Other specified disorders of Eustachian tube, bilateral: Secondary | ICD-10-CM | POA: Insufficient documentation

## 2019-05-22 DIAGNOSIS — Z800 Family history of malignant neoplasm of digestive organs: Secondary | ICD-10-CM | POA: Insufficient documentation

## 2019-05-22 NOTE — Assessment & Plan Note (Signed)
Check B12.  Consider supplementation if low.

## 2019-05-22 NOTE — Assessment & Plan Note (Signed)
In her daughter.  She has a colonoscopy set up for January.

## 2019-05-22 NOTE — Assessment & Plan Note (Signed)
I suspect your itching is related to this.  She will trial Flonase.

## 2019-05-22 NOTE — Assessment & Plan Note (Signed)
Bone density scan with osteoporosis.  We will check a vitamin D level and then determine if we could try Prolia.

## 2019-05-22 NOTE — Assessment & Plan Note (Signed)
Adequately controlled.  Continue current regimen. 

## 2019-05-25 ENCOUNTER — Other Ambulatory Visit: Payer: Self-pay

## 2019-05-25 ENCOUNTER — Ambulatory Visit (INDEPENDENT_AMBULATORY_CARE_PROVIDER_SITE_OTHER): Payer: Self-pay | Admitting: *Deleted

## 2019-05-25 DIAGNOSIS — E538 Deficiency of other specified B group vitamins: Secondary | ICD-10-CM

## 2019-05-25 MED ORDER — CYANOCOBALAMIN 1000 MCG/ML IJ SOLN
1000.0000 ug | Freq: Once | INTRAMUSCULAR | Status: AC
  Administered 2019-05-25: 1000 ug via INTRAMUSCULAR

## 2019-05-25 NOTE — Progress Notes (Signed)
See Lab Note from 05/20/19 Patient presented for B 12 injection to left deltoid, patient voiced no concerns nor showed any signs of distress during injection

## 2019-05-26 ENCOUNTER — Encounter: Payer: Self-pay | Admitting: *Deleted

## 2019-05-28 ENCOUNTER — Other Ambulatory Visit: Payer: Self-pay

## 2019-05-28 ENCOUNTER — Ambulatory Visit: Payer: Self-pay

## 2019-05-28 ENCOUNTER — Encounter: Payer: Self-pay | Admitting: Family Medicine

## 2019-05-28 ENCOUNTER — Ambulatory Visit: Payer: PPO | Admitting: Family Medicine

## 2019-05-28 VITALS — BP 130/80 | HR 81 | Ht 62.0 in | Wt 150.0 lb

## 2019-05-28 DIAGNOSIS — M79672 Pain in left foot: Secondary | ICD-10-CM | POA: Diagnosis not present

## 2019-05-28 NOTE — Assessment & Plan Note (Signed)
Left foot pain generally doing well at the moment.  We discussed icing regimen and home exercises.  Given an injection for more than neuroma this time eventually deferred.  Patient does have some dependent edema likely secondary to the amlodipine.  Will send message to primary care provider to see if we can do a different medication change with patient swelling seemed to be much worse recently.  No sign of any DVT with no pain with the calf.  Discussed proper shoes.  Follow-up again in 4 weeks

## 2019-05-28 NOTE — Patient Instructions (Signed)
Hold the Amlodipine  Elevate legs  Compression socks See me again in 4 weeks

## 2019-05-28 NOTE — Progress Notes (Signed)
[name] [address], [place] [address] Phone: [phone] Subjective:   I Lauren [name] am serving as a Education administrator for Dr. Hulan Saas.   CC: Left foot pain  [id number]   02/24/2019 Repeat injection given today.  Third tarsal phalangeal space.  Tolerated the procedure well.  Did have a very mild neuroma.  Still has some abnormal vascularity but has had MRI showing only severe arthritis.  Patient is wearing a more rigid shoes.  I think this is beneficial.  We discussed the possibility of PRP.  Follow-up again in 8 to 10 weeks  05/28/2019 Lauren [name] is a 78 y.o. female coming in with complaint of left foot pain. Left foot is swollen. Right is starting to swell.  Known arthritic changes.  Patient is having worsening swelling.  Feels like this is contributing to more of the aches and pains.  Last injection has been quite sometime in the left foot.  Starting to affect daily activities.     Past Medical History:  Diagnosis Date  . Allergic rhinitis   . Chickenpox   . Elevated LDL cholesterol level 11/26/2015  . Gallstones   . Glaucoma   . HTN (hypertension)   . Hx of adenomatous and sessile serrated colonic polyps 09/02/2018  . Incontinence of urine in female 10/05/2015  . Kidney stones   . Migraines   . Urinary incontinence    Past Surgical History:  Procedure Laterality Date  . CHOLECYSTECTOMY  1995  . SACRAL NERVE STIMULATOR PLACEMENT  04/2018   To control overactive bladder  . TONSILLECTOMY  1962  . TUBAL LIGATION     Social History   Socioeconomic History  . Marital status: Widowed    Spouse name: Not on file  . Number of children: 2  . Years of education: Not on file  . Highest education level: Not on file  Occupational History  . Occupation: retired  Scientific laboratory technician  . Financial resource strain: Not hard at all  . Food insecurity    Worry: Never true    Inability: Never true  . Transportation needs    Medical: No   Non-medical: No  Tobacco Use  . Smoking status: Former Smoker    Types: Cigarettes    Quit date: 08/06/1993    Years since quitting: 25.8  . Smokeless tobacco: Never Used  Substance and Sexual Activity  . Alcohol use: No    Alcohol/week: 0.0 standard drinks  . Drug use: No  . Sexual activity: Never  Lifestyle  . Physical activity    Days per week: Not on file    Minutes per session: Not on file  . Stress: Not on file  Relationships  . Social Herbalist on phone: Not on file    Gets together: Not on file    Attends religious service: Not on file    Active member of club or organization: Not on file    Attends meetings of clubs or organizations: Not on file    Relationship status: Widowed  Other Topics Concern  . Not on file  Social History Narrative   Widowed   1 son and 1 daughter   She is a retired Radiation protection practitioner   Former smoker   No alcohol or caffeine or drugs or any tobacco   Allergies  Allergen Reactions  . Eggs Or Egg-Derived Products   . Gabapentin Other (See Comments)    Unable to sleep  . Hydrochlorothiazide  Other (See Comments)    Leg pain and aches  . Hydrocodone-Guaifenesin Other (See Comments)    Cant sleep   . Influenza Vaccines Other (See Comments)    Nausea and vomiting and stomach pain for 10 weeks  . Mobic [Meloxicam] Swelling  . Oxybutynin     Memory loss per patient  . Sulfa Antibiotics Other (See Comments)    Cold sweats, passing out   . Codeine Rash    Cold sweats    Family History  Problem Relation Age of Onset  . Hematuria Father   . Heart disease Father   . Arthritis Father   . Heart disease Mother   . COPD Mother   . Atrial fibrillation Mother   . Hypertension Mother   . Colon cancer Daughter      Current Facility-Administered Medications (Endocrine & Metabolic):  .  betamethasone acetate-betamethasone sodium phosphate (CELESTONE) injection 3 mg  Current Outpatient Medications (Cardiovascular):  .  amLODipine  (NORVASC) 5 MG tablet, TAKE 1 TABLET(5 MG) BY MOUTH DAILY         Current Outpatient Medications (Other):  Marland Kitchen  Ascorbic Acid (VITAMIN C) 100 MG tablet, Take 100 mg by mouth daily. Marland Kitchen  latanoprost (XALATAN) 0.005 % ophthalmic solution, Place 1 drop into both eyes at bedtime.  .  Vitamin D, Ergocalciferol, (DRISDOL) 1.25 MG (50000 UT) CAPS capsule, Take 1 capsule (50,000 Units total) by mouth every 7 (seven) days.     Past medical history, social, surgical and family history all reviewed in electronic medical record.  No pertanent information unless stated regarding to the chief complaint.   Review of Systems:  No headache, visual changes, nausea, vomiting, diarrhea, constipation, dizziness, abdominal pain, skin rash, fevers, chills, night sweats, weight loss, swollen lymph nodes, body aches, joint swelling, muscle aches, chest pain, shortness of breath, mood changes.   Objective  Blood pressure 130/80, pulse 81, height 5\' 2"  (1.575 m), weight 150 lb (68 kg), SpO2 96 %.    General: No apparent distress alert and oriented x3 mood and affect normal, dressed appropriately.  HEENT: Pupils equal, extraocular movements intact  Respiratory: Patient's speak in full sentences and does not appear short of breath  Cardiovascular: 1+ lower extremity edema, non tender, no erythema more swelling though noted of the left than the right Skin: Warm dry intact with no signs of infection or rash on extremities or on axial skeleton.  Abdomen: Soft nontender  Neuro: Cranial nerves II through XII are intact, neurovascularly intact in all extremities with 2+ DTRs and 2+ pulses.  Lymph: No lymphadenopathy of posterior or anterior cervical chain or axillae bilaterally.  Gait antalgic MSK:  tender with limited range of motion and good stability and symmetric strength and tone of shoulders, elbows, wrist, hip, knee bilaterally.   Left foot exam shows the patient does have arthritic changes.  Positive squeeze  test with pain between the second and third metatarsal heads.  Patient does have significant amount of soft tissue swelling  Procedure: Real-time Ultrasound Guided Injection of left foot Device: GE Logiq Q7 Ultrasound guided injection is preferred based studies that show increased duration, increased effect, greater accuracy, decreased procedural pain, increased response rate, and decreased cost with ultrasound guided versus blind injection.  Verbal informed consent obtained.  Time-out conducted.  Noted no overlying erythema, induration, or other signs of local infection.  Skin prepped in a sterile fashion.  Local anesthesia: Topical Ethyl chloride.  With sterile technique and under real time ultrasound guidance: Left  foot neuroma injected with 0.5 cc of 0.5% Marcaine and 0.5 cc of Kenalog 40 mg/mL Completed without difficulty  Pain immediately resolved suggesting accurate placement of the medication.  Advised to call if fevers/chills, erythema, induration, drainage, or persistent bleeding.  Images permanently stored and available for review in the ultrasound unit.  Impression: Technically successful ultrasound guided injection.    Impression and Recommendations:     This case required medical decision making of moderate complexity. The above documentation has been reviewed and is accurate and complete Lyndal Pulley, DO       Note: This dictation was prepared with Dragon dictation along with smaller phrase technology. Any transcriptional errors that result from this process are unintentional.

## 2019-06-01 ENCOUNTER — Ambulatory Visit: Payer: PPO

## 2019-06-02 ENCOUNTER — Telehealth: Payer: Self-pay | Admitting: Family Medicine

## 2019-06-02 ENCOUNTER — Other Ambulatory Visit: Payer: Self-pay

## 2019-06-02 NOTE — Telephone Encounter (Signed)
Pt wants Dr Josephina Gip to know that Dr Tamala Julian dc'd her amLODipine (NORVASC) 5 MG tablet.  Her ankles were 3 times their size, and since she stopped the norvasc, they are normal and she feel great.  Her bp was 144/80.  Pt would like to know if you think she should go on another bp med.  Pt will be in for her B12 tomorrow.

## 2019-06-03 ENCOUNTER — Ambulatory Visit (INDEPENDENT_AMBULATORY_CARE_PROVIDER_SITE_OTHER): Payer: PPO

## 2019-06-03 ENCOUNTER — Other Ambulatory Visit: Payer: Self-pay

## 2019-06-03 DIAGNOSIS — E538 Deficiency of other specified B group vitamins: Secondary | ICD-10-CM | POA: Diagnosis not present

## 2019-06-03 MED ORDER — CYANOCOBALAMIN 1000 MCG/ML IJ SOLN
1000.0000 ug | Freq: Once | INTRAMUSCULAR | Status: AC
  Administered 2019-06-03: 1000 ug via INTRAMUSCULAR

## 2019-06-03 NOTE — Telephone Encounter (Signed)
I called to inform the patient of the message from the provider and the voicemail has not been set up.  Will try later.  Nina,cma

## 2019-06-03 NOTE — Progress Notes (Addendum)
Patient presented today for B12 injection.  Administered IM in the right deltoid.  Patient tolerated well with no signs of distress.    

## 2019-06-03 NOTE — Telephone Encounter (Signed)
Please see if she can check her BP daily for one week and then get [place] the readings. I would like to see what her bp dose off medication prior to starting another one. If it is elevated at home we can plan on starting a new medication. Thanks.

## 2019-06-04 ENCOUNTER — Encounter: Payer: Self-pay | Admitting: Family Medicine

## 2019-06-04 DIAGNOSIS — I10 Essential (primary) hypertension: Secondary | ICD-10-CM

## 2019-06-04 MED ORDER — LOSARTAN POTASSIUM 50 MG PO TABS: 50.0000 mg | ORAL_TABLET | Freq: Every day | ORAL | 3 refills | Status: DC

## 2019-06-04 NOTE — Telephone Encounter (Signed)
Spoke with the patient on the phone.  Advised we would start her on losartan.  She will need to have lab work and a BP check in 7 to 10 days.  We will try to arrange for her B12 injection to be changed to that date as well.  Advised I would need to check with our clinical pharmacist regarding the flu shot.  She reports having had a flu shot in the mid 1960s where she had significant GI symptoms and felt poorly for about 10 weeks.  She reports no additional ear itching so no need for Flonase or second-generation antihistamines to treat that

## 2019-06-07 ENCOUNTER — Ambulatory Visit: Payer: Self-pay

## 2019-06-07 ENCOUNTER — Telehealth: Payer: Self-pay

## 2019-06-07 NOTE — Telephone Encounter (Signed)
See other phone note

## 2019-06-07 NOTE — Telephone Encounter (Signed)
Patient returned a call to me via [facility] and I informed her the provider agrees for her to stop the Losartan, pt states she has not taken it today and the H/a, floaters and dizziness did go away.  Patient also states she has 90 pills of the amlodipine and does not need a new Rx at this time.  She wanted the provider to know that this morning she took her Bp in the rt arm 169/84 and in the lft 164/85.  Pt states she will watch for swelling but is not having any other symptoms.  Lauren [name],cma

## 2019-06-07 NOTE — Telephone Encounter (Signed)
She should stop the losartan. Please see if she has continued to have headaches and seeing floaters. Please see if her dizziness has improved. We can certainly place her back on the amlodipine as it was helpful for her BP though she will need to monitor for swelling. Please make sure she is not having any chest pain, shortness of breath, fainting episodes, numbness, or weakness. Thanks.

## 2019-06-07 NOTE — Telephone Encounter (Signed)
Incoming call from Patient who reports thath  She had a visit with Dr.  Caryl Bis. Last week was placed on Losartan.  States ever since she has taken the medication.  She has bee dizzy and had a headache  And flutters.   Blood pressures run 186/80. Patient States that she has stopped taking the Losartan. Would like to go back on amlodopine.  Every other day.   Pt.  Request a return call from Dr.  Caryl Bis to return call.  [phone]   Reason for Disposition . [id number] Systolic BP  >= 99991111 OR Diastolic >= A999333 AND A999333 missed most recent dose of blood pressure medication  Answer Assessment - Initial Assessment Questions 1. BLOOD PRESSURE: "What is the blood pressure?" "Did you take at least two measurements 5 minutes apart?"    183/080 2. ONSET: "When did you take your blood pressure?"     Sat.  3. HOW: "How did you obtain the blood pressure?" (e.g., visiting nurse, automatic home BP monitor)     Auto 4. HISTORY: "Do you have a history of high blood pressure?"     *No Answer*yes 5. MEDICATIONS: "Are you taking any medications for blood pressure?" "Have you missed any doses recently?"     *No Answer*Since Subnday 6. OTHER SYMPTOMS: "Do you have any symptoms?" (e.g., headache, chest pain, blurred vision, difficulty breathing, weakness)    dizzzy 7. PREGNANCY: "Is there any chance you are pregnant?" "When was your last menstrual period?"    Na  Protocols used: HIGH BLOOD PRESSURE-A-AH

## 2019-06-07 NOTE — Telephone Encounter (Signed)
Lmtcb.   Left message and informed patient that I will call back later today.  Lauren [name],cma

## 2019-06-08 NOTE — Telephone Encounter (Signed)
Noted.  She should take the amlodipine 5 mg once daily.  We should get her scheduled for a BP check in about 2 weeks.

## 2019-06-08 NOTE — Telephone Encounter (Signed)
I called and spoke with pateint and she is keeping her BP readings and will send them to provider through mychart.  Lauren [name],cma

## 2019-06-09 ENCOUNTER — Telehealth: Payer: Self-pay

## 2019-06-09 NOTE — Telephone Encounter (Signed)
Copied from [place] [id number]. Topic: General - Other >> Jun 09, 2019  3:06 PM Rainey Pines A wrote: Patient wanted to inform Dr. Caryl Bis that her Morning bp reading is 149/79 and the floaters has deceased and that she is feeling better since shes been taking the amlodipine.

## 2019-06-09 NOTE — Telephone Encounter (Signed)
Noted. She should monitor her BP over the next week and then call us with her readings to see if we need to add any other medication or increase the dose of the amlodpine.

## 2019-06-09 NOTE — Telephone Encounter (Signed)
Lmtcb. Christian for [employer] to schedule a nurse visit for a BP check in 2 weeks.  Nina,cma

## 2019-06-10 ENCOUNTER — Encounter: Payer: Self-pay | Admitting: Family Medicine

## 2019-06-10 ENCOUNTER — Other Ambulatory Visit: Payer: Self-pay

## 2019-06-10 ENCOUNTER — Telehealth: Payer: Self-pay

## 2019-06-10 ENCOUNTER — Ambulatory Visit (INDEPENDENT_AMBULATORY_CARE_PROVIDER_SITE_OTHER): Payer: PPO

## 2019-06-10 ENCOUNTER — Other Ambulatory Visit (INDEPENDENT_AMBULATORY_CARE_PROVIDER_SITE_OTHER): Payer: PPO

## 2019-06-10 DIAGNOSIS — I10 Essential (primary) hypertension: Secondary | ICD-10-CM | POA: Diagnosis not present

## 2019-06-10 DIAGNOSIS — E559 Vitamin D deficiency, unspecified: Secondary | ICD-10-CM | POA: Diagnosis not present

## 2019-06-10 DIAGNOSIS — E538 Deficiency of other specified B group vitamins: Secondary | ICD-10-CM

## 2019-06-10 LAB — BASIC METABOLIC PANEL
BUN: 22 mg/dL (ref 6–23)
CO2: 30 mEq/L (ref 19–32)
Calcium: 9.5 mg/dL (ref 8.4–10.5)
Chloride: 104 mEq/L (ref 96–112)
Creatinine, Ser: 0.92 mg/dL (ref 0.40–1.20)
GFR: 59.27 mL/min — ABNORMAL LOW (ref >60.00)
Glucose, Bld: 94 mg/dL (ref 70–99)
Potassium: 3.4 mEq/L — ABNORMAL LOW (ref 3.5–5.1)
Sodium: 142 mEq/L (ref 135–145)

## 2019-06-10 LAB — VITAMIN D 25 HYDROXY (VIT D DEFICIENCY, FRACTURES): VITD: 28.11 ng/mL — ABNORMAL LOW (ref 30.00–100.00)

## 2019-06-10 MED ORDER — CYANOCOBALAMIN 1000 MCG/ML IJ SOLN
1000.0000 ug | Freq: Once | INTRAMUSCULAR | Status: AC
  Administered 2019-06-10: 1000 ug via INTRAMUSCULAR

## 2019-06-10 NOTE — Telephone Encounter (Signed)
I called and left a voicemail informing the patient to take her BP for one week and write them down and then to send them to the provider on mychart.  Nina,cma

## 2019-06-10 NOTE — Progress Notes (Signed)
Patient presented today for B12 injection.  Administered IM in left deltoid.  Patient tolerated well with no signs of distress.   

## 2019-06-10 NOTE — Telephone Encounter (Signed)
Patient presented today for nurse visit for a B12 injection and left home blood pressure readings for Dr. Caryl Bis.  Patient said that she is only taking 1 tab of amlodipine daily and has not increased dosage.  Patient denies having any symptoms.  Patient said that previous headache is gone and she is not longer having floaters in her eyes.  Patient said Dr. Caryl Bis is aware that her bp has been elevated and has been monitoring it.   Copy of readings made.  Placed in Dr. Ellen Henri box for his review and a copy sent to scan.  CB#:  [id number]  Home bp readings:  10/10- 186/80  10/11- 176/81  10/12- 149/67 (1 hour after amlodipine)  10/14- 154/78  10/15- 152/79

## 2019-06-11 MED ORDER — AMLODIPINE BESYLATE 10 MG PO TABS: 10.0000 mg | ORAL_TABLET | Freq: Every day | ORAL | 3 refills | Status: DC

## 2019-06-11 NOTE — Telephone Encounter (Signed)
BPs noted. I would like to increase the dose of the amlodipine if she is willing to go to 10 mg daily. She would have to monitor for swelling. If she has swelling issues with the amlodipine we would need to add an additional medication. Thanks.

## 2019-06-11 NOTE — Telephone Encounter (Signed)
New Rx sent to pharmacy

## 2019-06-11 NOTE — Telephone Encounter (Signed)
I called and spoke to the patient and informed her that her amlodipine will be increased to 10 mg and she is to double up on the 5 mg to get the 10 mg dosage. She states she does have a 90 day supply.  Patient states she will monitor the swelling.  Nina,cma

## 2019-06-18 ENCOUNTER — Other Ambulatory Visit: Payer: Self-pay

## 2019-06-18 ENCOUNTER — Telehealth: Payer: Self-pay

## 2019-06-18 ENCOUNTER — Ambulatory Visit (INDEPENDENT_AMBULATORY_CARE_PROVIDER_SITE_OTHER): Payer: PPO | Admitting: *Deleted

## 2019-06-18 DIAGNOSIS — Z23 Encounter for immunization: Secondary | ICD-10-CM

## 2019-06-18 NOTE — Telephone Encounter (Signed)
I called the patient to schedule her for a nurse visit to get the egg-free flu vaccine, I left a message on her voicemail to call and schedule.  Jaxie Racanelli,cma

## 2019-06-23 ENCOUNTER — Ambulatory Visit (INDEPENDENT_AMBULATORY_CARE_PROVIDER_SITE_OTHER): Payer: PPO | Admitting: *Deleted

## 2019-06-23 ENCOUNTER — Other Ambulatory Visit: Payer: Self-pay

## 2019-06-23 VITALS — BP 150/60 | HR 81 | Resp 16

## 2019-06-23 DIAGNOSIS — I10 Essential (primary) hypertension: Secondary | ICD-10-CM

## 2019-06-23 NOTE — Progress Notes (Signed)
Patient here for nurse visit BP check per order from 06/07/19.   Patient reports compliance with prescribed BP medications: yes, Amlodipine 10 mg daily  Last dose of BP medication: 0730  BP Readings from Last 3 Encounters:  06/23/19 (!) 148/60  05/28/19 130/80  05/17/19 122/75   Pulse Readings from Last 3 Encounters:  06/23/19 81  05/28/19 81  05/17/19 75     Patient verbalized understanding of instructions.   Kerin Salen, RN

## 2019-06-26 NOTE — Progress Notes (Signed)
Systolic BP is slightly elevated though diastolic is borderline low limiting medication changes. Please see if she has been checking her BP at home. No change to be made at this time.   Tommi Rumps, MD

## 2019-06-30 ENCOUNTER — Encounter: Payer: Self-pay | Admitting: Family Medicine

## 2019-06-30 ENCOUNTER — Other Ambulatory Visit: Payer: Self-pay

## 2019-06-30 ENCOUNTER — Ambulatory Visit: Payer: Self-pay

## 2019-06-30 ENCOUNTER — Telehealth: Payer: Self-pay

## 2019-06-30 ENCOUNTER — Ambulatory Visit (INDEPENDENT_AMBULATORY_CARE_PROVIDER_SITE_OTHER): Payer: PPO | Admitting: Family Medicine

## 2019-06-30 VITALS — BP 130/60 | HR 83 | Ht 62.0 in | Wt 148.0 lb

## 2019-06-30 DIAGNOSIS — M79672 Pain in left foot: Secondary | ICD-10-CM

## 2019-06-30 DIAGNOSIS — M79662 Pain in left lower leg: Secondary | ICD-10-CM | POA: Diagnosis not present

## 2019-06-30 DIAGNOSIS — M216X2 Other acquired deformities of left foot: Secondary | ICD-10-CM

## 2019-06-30 DIAGNOSIS — M7989 Other specified soft tissue disorders: Secondary | ICD-10-CM | POA: Diagnosis not present

## 2019-06-30 NOTE — Progress Notes (Signed)
[name] [address], [place] [address] Phone: [phone] Subjective:   I Lauren [name] am serving as a Education administrator for Dr. Hulan Saas.  I'm seeing this patient by the request  of:    CC: Leg swelling and foot pain follow-up  [id number]   05/28/2019 Left foot pain generally doing well at the moment.  We discussed icing regimen and home exercises.  Given an injection for more than neuroma this time eventually deferred.  Patient does have some dependent edema likely secondary to the amlodipine.  Will send message to primary care provider to see if we can do a different medication change with patient swelling seemed to be much worse recently.  No sign of any DVT with no pain with the calf.  Discussed proper shoes.  Follow-up again in 4 weeks  06/30/2019 Lauren [name] is a 78 y.o. female coming in with complaint of left foot pain. Taken off blood pressure medications which decreased swelling but increased blood pressure. Since then the patient is back on the medication. States she cannot wear shoes with laces on the top due to swelling. Can't wear stocking socks and regular socks due to swelling. Concerned with how she will keep her feet warm during the winter. Ankle/foot is swollen today. Has changed her diet as well to cut down the swelling.  Patient does state that when she came off the amlodipine was doing significantly better for those days but the systolic blood pressure went to 180s to 190s.  Was put back on the amlodipine.     Past Medical History:  Diagnosis Date  . Allergic rhinitis   . Chickenpox   . Elevated LDL cholesterol level 11/26/2015  . Gallstones   . Glaucoma   . HTN (hypertension)   . Hx of adenomatous and sessile serrated colonic polyps 09/02/2018  . Incontinence of urine in female 10/05/2015  . Kidney stones   . Migraines   . Urinary incontinence    Past Surgical History:  Procedure Laterality Date  . CHOLECYSTECTOMY  1995   . SACRAL NERVE STIMULATOR PLACEMENT  04/2018   To control overactive bladder  . TONSILLECTOMY  1962  . TUBAL LIGATION     Social History   Socioeconomic History  . Marital status: Widowed    Spouse name: Not on file  . Number of children: 2  . Years of education: Not on file  . Highest education level: Not on file  Occupational History  . Occupation: retired  Scientific laboratory technician  . Financial resource strain: Not hard at all  . Food insecurity    Worry: Never true    Inability: Never true  . Transportation needs    Medical: No    Non-medical: No  Tobacco Use  . Smoking status: Former Smoker    Types: Cigarettes    Quit date: 08/06/1993    Years since quitting: 25.9  . Smokeless tobacco: Never Used  Substance and Sexual Activity  . Alcohol use: No    Alcohol/week: 0.0 standard drinks  . Drug use: No  . Sexual activity: Never  Lifestyle  . Physical activity    Days per week: Not on file    Minutes per session: Not on file  . Stress: Not on file  Relationships  . Social Herbalist on phone: Not on file    Gets together: Not on file    Attends religious service: Not on file    Active  member of club or organization: Not on file    Attends meetings of clubs or organizations: Not on file    Relationship status: Widowed  Other Topics Concern  . Not on file  Social History Narrative   Widowed   1 son and 1 daughter   She is a retired Radiation protection practitioner   Former smoker   No alcohol or caffeine or drugs or any tobacco   Allergies  Allergen Reactions  . Eggs Or Egg-Derived Products   . Gabapentin Other (See Comments)    Unable to sleep  . Hydrochlorothiazide Other (See Comments)    Leg pain and aches  . Hydrocodone-Guaifenesin Other (See Comments)    Cant sleep   . Influenza Vaccines Other (See Comments)    Nausea and vomiting and stomach pain for 10 weeks  . Mobic [Meloxicam] Swelling  . Oxybutynin     Memory loss per patient  . Sulfa Antibiotics Other (See  Comments)    Cold sweats, passing out   . Codeine Rash    Cold sweats    Family History  Problem Relation Age of Onset  . Hematuria Father   . Heart disease Father   . Arthritis Father   . Heart disease Mother   . COPD Mother   . Atrial fibrillation Mother   . Hypertension Mother   . Colon cancer Daughter      Current Facility-Administered Medications (Endocrine & Metabolic):  .  betamethasone acetate-betamethasone sodium phosphate (CELESTONE) injection 3 mg  Current Outpatient Medications (Cardiovascular):  .  amLODipine (NORVASC) 10 MG tablet, Take 1 tablet (10 mg total) by mouth daily.         Current Outpatient Medications (Other):  Marland Kitchen  Ascorbic Acid (VITAMIN C) 100 MG tablet, Take 100 mg by mouth daily. Marland Kitchen  latanoprost (XALATAN) 0.005 % ophthalmic solution, Place 1 drop into both eyes at bedtime.  .  Vitamin D, Ergocalciferol, (DRISDOL) 1.25 MG (50000 UT) CAPS capsule, Take 1 capsule (50,000 Units total) by mouth every 7 (seven) days.     Past medical history, social, surgical and family history all reviewed in electronic medical record.  No pertanent information unless stated regarding to the chief complaint.   Review of Systems:  No headache, visual changes, nausea, vomiting, diarrhea, constipation, dizziness, abdominal pain, skin rash, fevers, chills, night sweats, weight loss, swollen lymph nodes, body aches, joint swelling,  chest pain, shortness of breath, mood changes.  Positive muscle aches  Objective  Blood pressure 130/60, pulse 83, height 5\' 2"  (0000000 m), weight 148 lb (67.1 kg), SpO2 97 %.    General: No apparent distress alert and oriented x3 mood and affect normal, dressed appropriately.  HEENT: Pupils equal, extraocular movements intact  Respiratory: Patient's speak in full sentences and does not appear short of breath  Cardiovascular: 1+ lower extremity edema, minorly tender, no erythema  Skin: Warm dry intact with no signs of infection or rash  on extremities or on axial skeleton.  Abdomen: Soft nontender  Neuro: Cranial nerves II through XII are intact, neurovascularly intact in all extremities with 2+ DTRs and 2+ pulses.  Lymph: No lymphadenopathy of posterior or anterior cervical chain or axillae bilaterally.  Gait antalgic MSK:  tender with full range of motion and good stability and symmetric strength and tone of shoulders, elbows, wrist, hip, knee and ankles bilaterally.  Moderate arthritic changes of multiple joints Patient's foot exam does show the rigid midfoot.  Patient does have severe breakdown the transverse arch.  Negative  squeeze test.  Minimal discomfort noted today.   Impression and Recommendations:      The above documentation has been reviewed and is accurate and complete Lyndal Pulley, DO       Note: This dictation was prepared with Dragon dictation along with smaller phrase technology. Any transcriptional errors that result from this process are unintentional.

## 2019-06-30 NOTE — Telephone Encounter (Signed)
Copied from [place] [id number]. Topic: General - Other >> Jun 30, 2019 12:47 PM Carolyn Stare wrote: Pt call to say Dr Gardenia Phlegm told her to ask the doctor to put her back on 5mg  amLODipine  cause he took her off the 10mg  cause of the swelling , and if it continue to swell to take the 5mg  every other day , pt now has compression stocking

## 2019-06-30 NOTE — Assessment & Plan Note (Signed)
Patient continues to have the edema.  When patient was off the amlodipine made significant progress.  Has had difficulty though with blood pressure on any other medications with side effects.  Patient wants to try 5 mg of the amlodipine daily and see if this will help.  We discussed knee-high compression stockings would also be beneficial.  Patient's foot seems to be doing relatively well overall.  No injection today.  Follow-up with me again in 2 months spent  25 minutes with patient face-to-face and had greater than 50% of counseling including as described above in assessment and plan.

## 2019-06-30 NOTE — Assessment & Plan Note (Signed)
We will continue to monitor, no neuroma, no significant exacerbation of the arthritis.

## 2019-06-30 NOTE — Patient Instructions (Signed)
Amlodipine 5 mg daily for the next week and check blood pressure If blood pressure increases go back to 10 mg and stockings I am here for any question See me again in 2 months

## 2019-07-01 NOTE — Progress Notes (Signed)
No patient does not check home BP.

## 2019-07-06 NOTE — Telephone Encounter (Signed)
I am fine with her going back to 5 mg of the amlodipine though I would suggest that we switch her to a completely different medication for her blood pressure so that she does not have to deal with the swelling.  If she is willing to change medications I can send a new one to her pharmacy.

## 2019-07-06 NOTE — Telephone Encounter (Signed)
Pt call to say Dr Gardenia Phlegm told her to ask the doctor to put her back on 5mg  amLODipine  cause he took her off the 10mg  cause of the swelling , and if it continue to swell to take the 5mg  every other day , pt now has compression stocking.  Deklin Bieler,cma

## 2019-07-08 ENCOUNTER — Ambulatory Visit (INDEPENDENT_AMBULATORY_CARE_PROVIDER_SITE_OTHER): Payer: PPO

## 2019-07-08 ENCOUNTER — Other Ambulatory Visit: Payer: Self-pay

## 2019-07-08 DIAGNOSIS — E538 Deficiency of other specified B group vitamins: Secondary | ICD-10-CM | POA: Diagnosis not present

## 2019-07-08 MED ORDER — CYANOCOBALAMIN 1000 MCG/ML IJ SOLN
1000.0000 ug | Freq: Once | INTRAMUSCULAR | Status: AC
  Administered 2019-07-08: 1000 ug via INTRAMUSCULAR

## 2019-07-08 NOTE — Progress Notes (Signed)
Patient presented today for B12 injection.  Administered IM in the left deltoid.  Patient tolerated well with no signs of distress.   

## 2019-07-13 ENCOUNTER — Telehealth: Payer: Self-pay

## 2019-07-13 MED ORDER — AMLODIPINE BESYLATE 5 MG PO TABS: ORAL_TABLET | ORAL | 0 refills | Status: DC

## 2019-07-13 NOTE — Telephone Encounter (Signed)
Chart updated and 5 mg rx sent to local pharmacy

## 2019-07-13 NOTE — Telephone Encounter (Signed)
I called and spoke with the patient and she stated that she would rather stay on the amlodipine, she is not having any swelling or anything and she stated you and her had a greed that if any swelling she would take the 5 mg every other day but she is not experiencing any problems and her BP is good.  Stephen Baruch,cma

## 2019-07-13 NOTE — Telephone Encounter (Signed)
I called to inform patient that if she is finished the Vitamin D2 Rx she needs to start on the [facility] Vitamin D2, 1000 mg one daily.  This was left on her voicemail. Yousif Edelson,cma

## 2019-07-13 NOTE — Addendum Note (Signed)
Addended by: Crecencio Mc on: 07/13/2019 03:26 PM   Modules accepted: Orders

## 2019-07-30 NOTE — Telephone Encounter (Signed)
Message sent to patient via mychart.  Nina,cma  

## 2019-08-06 ENCOUNTER — Other Ambulatory Visit: Payer: Self-pay

## 2019-08-06 ENCOUNTER — Telehealth: Payer: Self-pay | Admitting: Family Medicine

## 2019-08-06 DIAGNOSIS — E559 Vitamin D deficiency, unspecified: Secondary | ICD-10-CM

## 2019-08-06 MED ORDER — VITAMIN D (ERGOCALCIFEROL) 1.25 MG (50000 UNIT) PO CAPS: 50000.0000 [IU] | ORAL_CAPSULE | ORAL | 0 refills | Status: DC

## 2019-08-06 NOTE — Telephone Encounter (Signed)
I called and left a voicemail informing the patient that the Vitamin D Rx for 50,000 units was sent to pharmacy.  Sharra Cayabyab,cma

## 2019-08-06 NOTE — Telephone Encounter (Signed)
Patient called stating she was told to take  Vitamin D2 50,000 mg once a week. She has tried to get this medication OTC. The pharmacy stated she needs a prescription for this dosage.

## 2019-08-09 ENCOUNTER — Ambulatory Visit (INDEPENDENT_AMBULATORY_CARE_PROVIDER_SITE_OTHER): Payer: PPO

## 2019-08-09 ENCOUNTER — Other Ambulatory Visit: Payer: Self-pay

## 2019-08-09 ENCOUNTER — Telehealth: Payer: Self-pay | Admitting: *Deleted

## 2019-08-09 DIAGNOSIS — E538 Deficiency of other specified B group vitamins: Secondary | ICD-10-CM | POA: Diagnosis not present

## 2019-08-09 MED ORDER — CYANOCOBALAMIN 1000 MCG/ML IJ SOLN
1000.0000 ug | Freq: Once | INTRAMUSCULAR | Status: AC
  Administered 2019-08-09: 1000 ug via INTRAMUSCULAR

## 2019-08-09 NOTE — Progress Notes (Signed)
Patient presented for B 12 injection to left deltoid, patient voiced no concerns nor showed any signs of distress during injection. 

## 2019-08-09 NOTE — Telephone Encounter (Signed)
Patient would like to schedule visit with Dr. Tamala Julian for foot issue. Developed black mark on foot over the weekend. No new injury. No pain associated with discoloration. Requests Wednesday appt. Patient scheduled.

## 2019-08-09 NOTE — Telephone Encounter (Signed)
Pt left msg stating she would like a call back to discuss the pain she is having in her foot.

## 2019-08-11 ENCOUNTER — Ambulatory Visit: Payer: PPO | Admitting: Family Medicine

## 2019-08-11 ENCOUNTER — Encounter: Payer: Self-pay | Admitting: Family Medicine

## 2019-08-11 NOTE — Progress Notes (Deleted)
[name] [address], [place] [address] Phone: [phone] Subjective:    I'm seeing this patient by the request  of:    CC:   [id number]  Lauren [name] is a 78 y.o. female coming in with complaint of ***  Onset-  Location Duration-  Character- Aggravating factors- Reliving factors-  Therapies tried-  Severity-     Past Medical History:  Diagnosis Date  . Allergic rhinitis   . Chickenpox   . Elevated LDL cholesterol level 11/26/2015  . Gallstones   . Glaucoma   . HTN (hypertension)   . Hx of adenomatous and sessile serrated colonic polyps 09/02/2018  . Incontinence of urine in female 10/05/2015  . Kidney stones   . Migraines   . Urinary incontinence    Past Surgical History:  Procedure Laterality Date  . CHOLECYSTECTOMY  1995  . SACRAL NERVE STIMULATOR PLACEMENT  04/2018   To control overactive bladder  . TONSILLECTOMY  1962  . TUBAL LIGATION     Social History   Socioeconomic History  . Marital status: Widowed    Spouse name: Not on file  . Number of children: 2  . Years of education: Not on file  . Highest education level: Not on file  Occupational History  . Occupation: retired  Tobacco Use  . Smoking status: Former Smoker    Types: Cigarettes    Quit date: 08/06/1993    Years since quitting: 26.0  . Smokeless tobacco: Never Used  Substance and Sexual Activity  . Alcohol use: No    Alcohol/week: 0.0 standard drinks  . Drug use: No  . Sexual activity: Never  Other Topics Concern  . Not on file  Social History Narrative   Widowed   1 son and 1 daughter   She is a retired Radiation protection practitioner   Former smoker   No alcohol or caffeine or drugs or any tobacco   Social Determinants of Radio broadcast assistant Strain:   . Difficulty of Paying Living Expenses: Not on file  Food Insecurity:   . Worried About Charity fundraiser in the Last Year: Not on file  . Ran Out of Food in the Last Year: Not on file    Transportation Needs:   . Lack of Transportation (Medical): Not on file  . Lack of Transportation (Non-Medical): Not on file  Physical Activity:   . Days of Exercise per Week: Not asked  . Minutes of Exercise per Session: Not on file  Stress:   . Feeling of Stress : Not on file  Social Connections:   . Frequency of Communication with Friends and Family: Not on file  . Frequency of Social Gatherings with Friends and Family: Not on file  . Attends Religious Services: Not on file  . Active Member of Clubs or Organizations: Not on file  . Attends Archivist Meetings: Not on file  . Marital Status: Not on file   Allergies  Allergen Reactions  . Eggs Or Egg-Derived Products   . Gabapentin Other (See Comments)    Unable to sleep  . Hydrochlorothiazide Other (See Comments)    Leg pain and aches  . Hydrocodone-Guaifenesin Other (See Comments)    Cant sleep   . Influenza Vaccines Other (See Comments)    Nausea and vomiting and stomach pain for 10 weeks  . Mobic [Meloxicam] Swelling  . Oxybutynin     Memory loss per patient  . Sulfa Antibiotics  Other (See Comments)    Cold sweats, passing out   . Codeine Rash    Cold sweats    Family History  Problem Relation Age of Onset  . Hematuria Father   . Heart disease Father   . Arthritis Father   . Heart disease Mother   . COPD Mother   . Atrial fibrillation Mother   . Hypertension Mother   . Colon cancer Daughter      Current Facility-Administered Medications (Endocrine & Metabolic):  .  betamethasone acetate-betamethasone sodium phosphate (CELESTONE) injection 3 mg  Current Outpatient Medications (Cardiovascular):  .  amLODipine (NORVASC) 5 MG tablet, 1 tablet every other day         Current Outpatient Medications (Other):  Marland Kitchen  Ascorbic Acid (VITAMIN C) 100 MG tablet, Take 100 mg by mouth daily. Marland Kitchen  latanoprost (XALATAN) 0.005 % ophthalmic solution, Place 1 drop into both eyes at bedtime.  .  Vitamin D,  Ergocalciferol, (DRISDOL) 1.25 MG (50000 UT) CAPS capsule, Take 1 capsule (50,000 Units total) by mouth every 7 (seven) days.     Past medical history, social, surgical and family history all reviewed in electronic medical record.  No pertanent information unless stated regarding to the chief complaint.   Review of Systems:  No headache, visual changes, nausea, vomiting, diarrhea, constipation, dizziness, abdominal pain, skin rash, fevers, chills, night sweats, weight loss, swollen lymph nodes, body aches, joint swelling, muscle aches, chest pain, shortness of breath, mood changes.   Objective  There were no vitals taken for this visit. Systems examined below as of    General: No apparent distress alert and oriented x3 mood and affect normal, dressed appropriately.  HEENT: Pupils equal, extraocular movements intact  Respiratory: Patient's speak in full sentences and does not appear short of breath  Cardiovascular: No lower extremity edema, non tender, no erythema  Skin: Warm dry intact with no signs of infection or rash on extremities or on axial skeleton.  Abdomen: Soft nontender  Neuro: Cranial nerves II through XII are intact, neurovascularly intact in all extremities with 2+ DTRs and 2+ pulses.  Lymph: No lymphadenopathy of posterior or anterior cervical chain or axillae bilaterally.  Gait normal with good balance and coordination.  MSK:  Non tender with full range of motion and good stability and symmetric strength and tone of shoulders, elbows, wrist, hip, knee and ankles bilaterally.     Impression and Recommendations:     This case required medical decision making of moderate complexity. The above documentation has been reviewed and is accurate and complete Lyndal Pulley, DO       Note: This dictation was prepared with Dragon dictation along with smaller phrase technology. Any transcriptional errors that result from this process are unintentional.

## 2019-08-13 ENCOUNTER — Ambulatory Visit: Payer: Self-pay

## 2019-08-13 ENCOUNTER — Encounter: Payer: Self-pay | Admitting: Family Medicine

## 2019-08-13 ENCOUNTER — Ambulatory Visit (INDEPENDENT_AMBULATORY_CARE_PROVIDER_SITE_OTHER): Payer: PPO | Admitting: Family Medicine

## 2019-08-13 VITALS — BP 124/82 | HR 85 | Ht 62.0 in | Wt 148.0 lb

## 2019-08-13 DIAGNOSIS — M79672 Pain in left foot: Secondary | ICD-10-CM

## 2019-08-13 DIAGNOSIS — M216X2 Other acquired deformities of left foot: Secondary | ICD-10-CM | POA: Diagnosis not present

## 2019-08-13 DIAGNOSIS — M19079 Primary osteoarthritis, unspecified ankle and foot: Secondary | ICD-10-CM

## 2019-08-13 NOTE — Progress Notes (Signed)
[name] [place], [place] [address] Phone: [phone] Subjective:     CC:   Foot swelling and bruising follow-up  [id number]   I, Jacqualin Combes, am serving as a scribe for Dr. Hulan Saas. This visit occurred during the SARS-CoV-2 public health emergency.  Safety protocols were in place, including screening questions prior to the visit, additional usage of staff PPE, and extensive cleaning of exam room while observing appropriate contact time as indicated for disinfecting solutions.   Lauren [name] is a 78 y.o. female coming in with complaint of foot discoloration over the 2-4th toes for one week. Is concerned that this came from injection.  Patient states that there is no significant swelling otherwise.  This did not happen until the last couple days ago.  Starting to improve again at this time.     Past Medical History:  Diagnosis Date  . Allergic rhinitis   . Chickenpox   . Elevated LDL cholesterol level 11/26/2015  . Gallstones   . Glaucoma   . HTN (hypertension)   . Hx of adenomatous and sessile serrated colonic polyps 09/02/2018  . Incontinence of urine in female 10/05/2015  . Kidney stones   . Migraines   . Urinary incontinence    Past Surgical History:  Procedure Laterality Date  . CHOLECYSTECTOMY  1995  . SACRAL NERVE STIMULATOR PLACEMENT  04/2018   To control overactive bladder  . TONSILLECTOMY  1962  . TUBAL LIGATION     Social History   Socioeconomic History  . Marital status: Widowed    Spouse name: Not on file  . Number of children: 2  . Years of education: Not on file  . Highest education level: Not on file  Occupational History  . Occupation: retired  Tobacco Use  . Smoking status: Former Smoker    Types: Cigarettes    Quit date: 08/06/1993    Years since quitting: 26.0  . Smokeless tobacco: Never Used  Substance and Sexual Activity  . Alcohol use: No    Alcohol/week: 0.0 standard drinks  . Drug  use: No  . Sexual activity: Never  Other Topics Concern  . Not on file  Social History Narrative   Widowed   1 son and 1 daughter   She is a retired Radiation protection practitioner   Former smoker   No alcohol or caffeine or drugs or any tobacco   Social Determinants of Radio broadcast assistant Strain:   . Difficulty of Paying Living Expenses: Not on file  Food Insecurity:   . Worried About Charity fundraiser in the Last Year: Not on file  . Ran Out of Food in the Last Year: Not on file  Transportation Needs:   . Lack of Transportation (Medical): Not on file  . Lack of Transportation (Non-Medical): Not on file  Physical Activity:   . Days of Exercise per Week: Not asked  . Minutes of Exercise per Session: Not on file  Stress:   . Feeling of Stress : Not on file  Social Connections:   . Frequency of Communication with Friends and Family: Not on file  . Frequency of Social Gatherings with Friends and Family: Not on file  . Attends Religious Services: Not on file  . Active Member of Clubs or Organizations: Not on file  . Attends Archivist Meetings: Not on file  . Marital Status: Not on file   Allergies  Allergen Reactions  . Eggs  Or Egg-Derived Products   . Gabapentin Other (See Comments)    Unable to sleep  . Hydrochlorothiazide Other (See Comments)    Leg pain and aches  . Hydrocodone-Guaifenesin Other (See Comments)    Cant sleep   . Influenza Vaccines Other (See Comments)    Nausea and vomiting and stomach pain for 10 weeks  . Mobic [Meloxicam] Swelling  . Oxybutynin     Memory loss per patient  . Sulfa Antibiotics Other (See Comments)    Cold sweats, passing out   . Codeine Rash    Cold sweats    Family History  Problem Relation Age of Onset  . Hematuria Father   . Heart disease Father   . Arthritis Father   . Heart disease Mother   . COPD Mother   . Atrial fibrillation Mother   . Hypertension Mother   . Colon cancer Daughter      Current  Facility-Administered Medications (Endocrine & Metabolic):  .  betamethasone acetate-betamethasone sodium phosphate (CELESTONE) injection 3 mg  Current Outpatient Medications (Cardiovascular):  .  amLODipine (NORVASC) 5 MG tablet, 1 tablet every other day         Current Outpatient Medications (Other):  Marland Kitchen  Ascorbic Acid (VITAMIN C) 100 MG tablet, Take 100 mg by mouth daily. Marland Kitchen  latanoprost (XALATAN) 0.005 % ophthalmic solution, Place 1 drop into both eyes at bedtime.  .  Vitamin D, Ergocalciferol, (DRISDOL) 1.25 MG (50000 UT) CAPS capsule, Take 1 capsule (50,000 Units total) by mouth every 7 (seven) days.     Past medical history, social, surgical and family history all reviewed in electronic medical record.  No pertanent information unless stated regarding to the chief complaint.   Review of Systems:  No headache, visual changes, nausea, vomiting, diarrhea, constipation, dizziness, abdominal pain, skin rash, fevers, chills, night sweats, weight loss, swollen lymph nodes, body aches, joint swelling, , chest pain, shortness of breath, mood changes.  Positive muscle aches  Objective  Blood pressure 124/82, pulse 85, height 5\' 2"  (1.575 m), weight 148 lb (67.1 kg), SpO2 98 %.   General: No apparent distress alert and oriented x3 mood and affect normal, dressed appropriately.  HEENT: Pupils equal, extraocular movements intact  Respiratory: Patient's speak in full sentences and does not appear short of breath  Cardiovascular: No lower extremity edema, non tender, no erythema  Skin: Warm dry intact with no signs of infection or rash on extremities or on axial skeleton.  Abdomen: Soft nontender  Neuro: Cranial nerves II through XII are intact, neurovascularly intact in all extremities with 2+ DTRs and 2+ pulses.  Lymph: No lymphadenopathy of posterior or anterior cervical chain or axillae bilaterally.  Gait normal with good balance and coordination.  MSK:  tender with limited range of  motion and good stability and symmetric strength and tone of shoulders, elbows, wrist, hip, knee and ankles bilaterally.  Left foot exam shows the patient's bruising seems to be resolving on the dorsal aspect of the foot.  No significant other findings.  Nontender in the area.  Still has a rigid midfoot.  Breakdown of the transverse arch Limited musculoskeletal ultrasound was performed and interpreted by Lyndal Pulley  Limited musculoskeletal ultrasound of the dorsal aspect of the foot shows the patient does still have the underlying arthritic changes mild soft tissue edema noted.   Impression and Recommendations:      The above documentation has been reviewed and is accurate and complete Lyndal Pulley, DO  Note: This dictation was prepared with Dragon dictation along with smaller phrase technology. Any transcriptional errors that result from this process are unintentional.

## 2019-08-13 NOTE — Patient Instructions (Addendum)
  [address] 1st floor [place], [place] [address] Phone [phone]  Arnica lotion Happy Holidays!

## 2019-08-13 NOTE — Assessment & Plan Note (Signed)
Left foot does have more of a bruise noted.  Nothing severe.  Nontender.  Good vascularity and good blood flow noted is distally to the amount of bruising.  Follow-up as needed

## 2019-08-17 NOTE — Progress Notes (Signed)
I have reviewed the above note and agree.  Mamie Hundertmark, M.D.  

## 2019-08-30 ENCOUNTER — Ambulatory Visit: Payer: PPO | Admitting: Family Medicine

## 2019-08-31 ENCOUNTER — Encounter: Payer: Self-pay | Admitting: Family Medicine

## 2019-08-31 ENCOUNTER — Other Ambulatory Visit: Payer: Self-pay

## 2019-08-31 ENCOUNTER — Ambulatory Visit (INDEPENDENT_AMBULATORY_CARE_PROVIDER_SITE_OTHER): Payer: PPO | Admitting: Family Medicine

## 2019-08-31 DIAGNOSIS — G5762 Lesion of plantar nerve, left lower limb: Secondary | ICD-10-CM

## 2019-08-31 NOTE — Assessment & Plan Note (Signed)
Repeat injection given again today.  Loss of transverse arch and rigid midfoot Sowles causing some of the discomfort and pain as well.  Went for more of the joint today.  Gust icing regimen and home exercise, discussed other medications which patient has declined to take on a regular basis.  Discussed proper shoe choices and over-the-counter orthotics.  Follow-up again in 4 to 8 weeks

## 2019-08-31 NOTE — Patient Instructions (Signed)
Injected foot today See me again in

## 2019-08-31 NOTE — Progress Notes (Signed)
[facility] [place] Phone: [phone] Subjective:   [name], am serving as a scribe for Dr. Hulan Saas. This visit occurred during the SARS-CoV-2 public health emergency.  Safety protocols were in place, including screening questions prior to the visit, additional usage of staff PPE, and extensive cleaning of exam room while observing appropriate contact time as indicated for disinfecting solutions.    CC: Foot pain follow-up  [id number]   08/13/2019 Left foot does have more of a bruise noted.  Nothing severe.  Nontender.  Good vascularity and good blood flow noted is distally to the amount of bruising.  Follow-up as needed  Update 08/31/2019 Mayola MIKENZY YAO is a 79 y.o. female coming in with complaint of left foot bruising. Patient states that the swelling in her left foot has started to increase since we last saw her. Came off of Norvasc and swelling has not gone down.  Patient continues to have discomfort and pain.  Patient states that unfortunately thinks is more secondary to the arthritis she has had previously.  Starting affect daily activities on a more regular basis.  Does not remember any new injury.  States that the bruising that was seen previously has completely resolved.     Past Medical History:  Diagnosis Date  . Allergic rhinitis   . Chickenpox   . Elevated LDL cholesterol level 11/26/2015  . Gallstones   . Glaucoma   . HTN (hypertension)   . Hx of adenomatous and sessile serrated colonic polyps 09/02/2018  . Incontinence of urine in female 10/05/2015  . Kidney stones   . Migraines   . Urinary incontinence    Past Surgical History:  Procedure Laterality Date  . CHOLECYSTECTOMY  1995  . SACRAL NERVE STIMULATOR PLACEMENT  04/2018   To control overactive bladder  . TONSILLECTOMY  1962  . TUBAL LIGATION     Social History   Socioeconomic History  . Marital status: Widowed    Spouse name: Not  on file  . Number of children: 2  . Years of education: Not on file  . Highest education level: Not on file  Occupational History  . Occupation: retired  Tobacco Use  . Smoking status: Former Smoker    Types: Cigarettes    Quit date: 08/06/1993    Years since quitting: 26.0  . Smokeless tobacco: Never Used  Substance and Sexual Activity  . Alcohol use: No    Alcohol/week: 0.0 standard drinks  . Drug use: No  . Sexual activity: Never  Other Topics Concern  . Not on file  Social History Narrative   Widowed   1 son and 1 daughter   She is a retired Radiation protection practitioner   Former smoker   No alcohol or caffeine or drugs or any tobacco   Social Determinants of Radio broadcast assistant Strain:   . Difficulty of Paying Living Expenses: Not on file  Food Insecurity:   . Worried About Charity fundraiser in the Last Year: Not on file  . Ran Out of Food in the Last Year: Not on file  Transportation Needs:   . Lack of Transportation (Medical): Not on file  . Lack of Transportation (Non-Medical): Not on file  Physical Activity:   . Days of Exercise per Week: Not asked  . Minutes of Exercise per Session: Not on file  Stress:   . Feeling of Stress : Not on file  Social Connections:   .  Frequency of Communication with Friends and Family: Not on file  . Frequency of Social Gatherings with Friends and Family: Not on file  . Attends Religious Services: Not on file  . Active Member of Clubs or Organizations: Not on file  . Attends Archivist Meetings: Not on file  . Marital Status: Not on file   Allergies  Allergen Reactions  . Eggs Or Egg-Derived Products   . Gabapentin Other (See Comments)    Unable to sleep  . Hydrochlorothiazide Other (See Comments)    Leg pain and aches  . Hydrocodone-Guaifenesin Other (See Comments)    Cant sleep   . Influenza Vaccines Other (See Comments)    Nausea and vomiting and stomach pain for 10 weeks  . Mobic [Meloxicam] Swelling  .  Oxybutynin     Memory loss per patient  . Sulfa Antibiotics Other (See Comments)    Cold sweats, passing out   . Codeine Rash    Cold sweats    Family History  Problem Relation Age of Onset  . Hematuria Father   . Heart disease Father   . Arthritis Father   . Heart disease Mother   . COPD Mother   . Atrial fibrillation Mother   . Hypertension Mother   . Colon cancer Daughter      Current Facility-Administered Medications (Endocrine & Metabolic):  .  betamethasone acetate-betamethasone sodium phosphate (CELESTONE) injection 3 mg  Current Outpatient Medications (Cardiovascular):  .  amLODipine (NORVASC) 5 MG tablet, 1 tablet every other day         Current Outpatient Medications (Other):  Marland Kitchen  Ascorbic Acid (VITAMIN C) 100 MG tablet, Take 100 mg by mouth daily. Marland Kitchen  latanoprost (XALATAN) 0.005 % ophthalmic solution, Place 1 drop into both eyes at bedtime.  .  Vitamin D, Ergocalciferol, (DRISDOL) 1.25 MG (50000 UT) CAPS capsule, Take 1 capsule (50,000 Units total) by mouth every 7 (seven) days.     Past medical history, social, surgical and family history all reviewed in electronic medical record.  No pertanent information unless stated regarding to the chief complaint.   Review of Systems:  No headache, visual changes, nausea, vomiting, diarrhea, constipation, dizziness, abdominal pain, skin rash, fevers, chills, night sweats, weight loss, swollen lymph nodes, body aches, joint swelling, muscle aches, chest pain, shortness of breath, mood changes.   Objective  Blood pressure 120/74, pulse (!) 101, height 5\' 2"  (1.575 m), weight 153 lb (69.4 kg), SpO2 96 %.    General: No apparent distress alert and oriented x3 mood and affect normal, dressed appropriately.  HEENT: Pupils equal, extraocular movements intact  Respiratory: Patient's speak in full sentences and does not appear short of breath  Cardiovascular: Trace lower extremity edema, tender, no erythema and appears to  be symmetric Skin: Warm dry intact with no signs of infection or rash on extremities or on axial skeleton.  Abdomen: Soft nontender  Neuro: Cranial nerves II through XII are intact, neurovascularly intact in all extremities with 2+ DTRs and 2+ pulses.  Lymph: No lymphadenopathy of posterior or anterior cervical chain or axillae bilaterally.  Gait antalgic favoring left foot MSK:  tender with limited range of motion and good stability and symmetric strength and tone of shoulders, elbows, wrist, hip, knee and ankles bilaterally.  Moderate arthritic changes in multiple joints  Left foot exam does have very rigid midfoot.  Breakdown longitudinal and transverse arch pain.  Pain between the third and fourth metatarsals.   After verbal consent patient  was prepped with alcohol swabs and with a 25-gauge half inch needle injected into the midfoot of the left foot between the third and fourth metatarsals.  Total of 0.5 cc of 0.5% Marcaine and 0.5 cc of Kenalog 40 mg/mL    Impression and Recommendations:     This case required medical decision making of moderate complexity. The above documentation has been reviewed and is accurate and complete Lyndal Pulley, DO       Note: This dictation was prepared with Dragon dictation along with smaller phrase technology. Any transcriptional errors that result from this process are unintentional.

## 2019-09-01 ENCOUNTER — Ambulatory Visit: Payer: PPO | Admitting: Family Medicine

## 2019-09-09 ENCOUNTER — Ambulatory Visit (INDEPENDENT_AMBULATORY_CARE_PROVIDER_SITE_OTHER): Payer: PPO | Admitting: Lab

## 2019-09-09 ENCOUNTER — Other Ambulatory Visit: Payer: Self-pay

## 2019-09-09 ENCOUNTER — Telehealth: Payer: Self-pay | Admitting: Family Medicine

## 2019-09-09 DIAGNOSIS — E538 Deficiency of other specified B group vitamins: Secondary | ICD-10-CM | POA: Diagnosis not present

## 2019-09-09 MED ORDER — CYANOCOBALAMIN 1000 MCG/ML IJ SOLN
1000.0000 ug | Freq: Once | INTRAMUSCULAR | Status: AC
  Administered 2019-09-09: 1000 ug via INTRAMUSCULAR

## 2019-09-09 MED ORDER — AMLODIPINE BESYLATE 5 MG PO TABS: ORAL_TABLET | ORAL | 0 refills | Status: DC

## 2019-09-09 NOTE — Telephone Encounter (Signed)
Pt called about medication of lostartan at the pharmacy and her regular medication of amLODipine (NORVASC) 5 MG tablet and pt does not know which medication to take. Please advise and Thank you!  Pharmacy is [id number] - [address], [place] AT [facility] [address] [facility]  Call pt @ [phone].

## 2019-09-09 NOTE — Addendum Note (Signed)
Addended by: Nanci Pina on: 09/09/2019 01:04 PM   Modules accepted: Orders

## 2019-09-09 NOTE — Progress Notes (Addendum)
Pt in office today for b-12 injection in L-Deltoid. Pt tolerated well. Agree  [place]

## 2019-09-10 DIAGNOSIS — R350 Frequency of micturition: Secondary | ICD-10-CM | POA: Diagnosis not present

## 2019-09-10 DIAGNOSIS — N3946 Mixed incontinence: Secondary | ICD-10-CM | POA: Diagnosis not present

## 2019-09-30 ENCOUNTER — Other Ambulatory Visit: Payer: Self-pay | Admitting: Family Medicine

## 2019-09-30 ENCOUNTER — Other Ambulatory Visit: Payer: Self-pay

## 2019-09-30 DIAGNOSIS — E559 Vitamin D deficiency, unspecified: Secondary | ICD-10-CM

## 2019-09-30 NOTE — Telephone Encounter (Signed)
I ordered the labs for the patient and I called her to inform her that we will check her Vitamin D level before prescribing vitamin D, a message was left for her to call back and ask for Gae Bon.  Daralyn Bert,cma

## 2019-09-30 NOTE — Telephone Encounter (Signed)
Yes, please order labs, though please order a CMP instead of a BMP. Thanks.

## 2019-10-03 ENCOUNTER — Other Ambulatory Visit: Payer: Self-pay | Admitting: Family Medicine

## 2019-10-03 DIAGNOSIS — E559 Vitamin D deficiency, unspecified: Secondary | ICD-10-CM

## 2019-10-13 ENCOUNTER — Other Ambulatory Visit (INDEPENDENT_AMBULATORY_CARE_PROVIDER_SITE_OTHER): Payer: PPO

## 2019-10-13 ENCOUNTER — Other Ambulatory Visit: Payer: Self-pay

## 2019-10-13 ENCOUNTER — Ambulatory Visit (INDEPENDENT_AMBULATORY_CARE_PROVIDER_SITE_OTHER): Payer: PPO

## 2019-10-13 DIAGNOSIS — E559 Vitamin D deficiency, unspecified: Secondary | ICD-10-CM

## 2019-10-13 DIAGNOSIS — E538 Deficiency of other specified B group vitamins: Secondary | ICD-10-CM

## 2019-10-13 LAB — COMPREHENSIVE METABOLIC PANEL
ALT: 10 U/L (ref 0–35)
AST: 17 U/L (ref 0–37)
Albumin: 4.2 g/dL (ref 3.5–5.2)
Alkaline Phosphatase: 51 U/L (ref 39–117)
BUN: 20 mg/dL (ref 6–23)
CO2: 32 mEq/L (ref 19–32)
Calcium: 9.4 mg/dL (ref 8.4–10.5)
Chloride: 105 mEq/L (ref 96–112)
Creatinine, Ser: 0.88 mg/dL (ref 0.40–1.20)
GFR: 62.33 mL/min (ref >60.00)
Glucose, Bld: 88 mg/dL (ref 70–99)
Potassium: 3.7 mEq/L (ref 3.5–5.1)
Sodium: 144 mEq/L (ref 135–145)
Total Bilirubin: 0.6 mg/dL (ref 0.2–1.2)
Total Protein: 6.3 g/dL (ref 6.0–8.3)

## 2019-10-13 LAB — VITAMIN D 25 HYDROXY (VIT D DEFICIENCY, FRACTURES): VITD: 37.99 ng/mL (ref 30.00–100.00)

## 2019-10-13 LAB — VITAMIN B12: Vitamin B-12: 340 pg/mL (ref 211–911)

## 2019-10-13 MED ORDER — CYANOCOBALAMIN 1000 MCG/ML IJ SOLN
1000.0000 ug | Freq: Once | INTRAMUSCULAR | Status: AC
  Administered 2019-10-13: 1000 ug via INTRAMUSCULAR

## 2019-10-13 NOTE — Progress Notes (Signed)
I have reviewed the above note and agree.  Kennidee Heyne, M.D.  

## 2019-10-13 NOTE — Progress Notes (Signed)
Patient presented for B 12 injection to right deltoid, patient voiced no concerns nor showed any signs of distress during injection. 

## 2019-10-16 ENCOUNTER — Other Ambulatory Visit: Payer: Self-pay

## 2019-10-16 ENCOUNTER — Ambulatory Visit: Payer: PPO | Attending: Internal Medicine

## 2019-10-16 DIAGNOSIS — Z23 Encounter for immunization: Secondary | ICD-10-CM | POA: Insufficient documentation

## 2019-10-16 NOTE — Progress Notes (Signed)
   Covid-19 Vaccination Clinic  Name:  Lauren [name]    MRN: [id number] DOB: 1941/07/02  10/16/2019  [name] was observed post Covid-19 immunization for 15 minutes without incidence. She was provided with Vaccine Information Sheet and instruction to access the V-Safe system.   [name] was instructed to call 911 with any severe reactions post vaccine: Marland Kitchen Difficulty breathing  . Swelling of your face and throat  . A fast heartbeat  . A bad rash all over your body  . Dizziness and weakness    Immunizations Administered    Name Date Dose VIS Date Route   Pfizer COVID-19 Vaccine 10/16/2019  8:51 AM 0.3 mL 08/06/2019 Intramuscular   Manufacturer: [place]   Lot: [id number]   [place]: [id number]

## 2019-10-22 DIAGNOSIS — I10 Essential (primary) hypertension: Secondary | ICD-10-CM | POA: Diagnosis not present

## 2019-10-22 DIAGNOSIS — H401131 Primary open-angle glaucoma, bilateral, mild stage: Secondary | ICD-10-CM | POA: Diagnosis not present

## 2019-10-22 DIAGNOSIS — Z961 Presence of intraocular lens: Secondary | ICD-10-CM | POA: Diagnosis not present

## 2019-10-22 DIAGNOSIS — H18413 Arcus senilis, bilateral: Secondary | ICD-10-CM | POA: Diagnosis not present

## 2019-11-09 ENCOUNTER — Ambulatory Visit: Payer: PPO | Attending: Internal Medicine

## 2019-11-09 DIAGNOSIS — Z23 Encounter for immunization: Secondary | ICD-10-CM

## 2019-11-09 NOTE — Progress Notes (Signed)
   Covid-19 Vaccination Clinic  Name:  TALESHA GHENT    MRN: [id number] DOB: 24-Feb-1941  11/09/2019  Ms. Bowland was observed post Covid-19 immunization for 15 minutes without incident. She was provided with Vaccine Information Sheet and instruction to access the V-Safe system.   Ms. Fout was instructed to call 911 with any severe reactions post vaccine: Marland Kitchen Difficulty breathing  . Swelling of face and throat  . A fast heartbeat  . A bad rash all over body  . Dizziness and weakness   Immunizations Administered    Name Date Dose VIS Date Route   Pfizer COVID-19 Vaccine 11/09/2019  9:26 AM 0.3 mL 08/06/2019 Intramuscular   Manufacturer: [place]   Lot: [id number]   [place]: [id number]

## 2019-11-10 ENCOUNTER — Ambulatory Visit: Payer: PPO

## 2019-11-11 ENCOUNTER — Ambulatory Visit (INDEPENDENT_AMBULATORY_CARE_PROVIDER_SITE_OTHER): Payer: PPO

## 2019-11-11 ENCOUNTER — Other Ambulatory Visit: Payer: Self-pay

## 2019-11-11 DIAGNOSIS — E538 Deficiency of other specified B group vitamins: Secondary | ICD-10-CM

## 2019-11-11 MED ORDER — AMLODIPINE BESYLATE 5 MG PO TABS: ORAL_TABLET | ORAL | 0 refills | Status: DC

## 2019-11-11 MED ORDER — CYANOCOBALAMIN 1000 MCG/ML IJ SOLN
1000.0000 ug | Freq: Once | INTRAMUSCULAR | Status: AC
  Administered 2019-11-11: 1000 ug via INTRAMUSCULAR

## 2019-11-11 NOTE — Addendum Note (Signed)
Addended by: Fulton Mole D on: 11/11/2019 10:38 AM   Modules accepted: Orders

## 2019-11-11 NOTE — Progress Notes (Signed)
Lauren [name] presents today for injection per MD orders. B12 injection  administered IM in left Upper Arm. Administration without incident. Patient tolerated well.  Warnie Belair,cma

## 2019-11-15 ENCOUNTER — Encounter: Payer: Self-pay | Admitting: Family Medicine

## 2019-11-15 ENCOUNTER — Other Ambulatory Visit: Payer: Self-pay

## 2019-11-15 ENCOUNTER — Ambulatory Visit (INDEPENDENT_AMBULATORY_CARE_PROVIDER_SITE_OTHER): Payer: PPO | Admitting: Family Medicine

## 2019-11-15 DIAGNOSIS — M810 Age-related osteoporosis without current pathological fracture: Secondary | ICD-10-CM

## 2019-11-15 DIAGNOSIS — I10 Essential (primary) hypertension: Secondary | ICD-10-CM

## 2019-11-15 DIAGNOSIS — Z86010 Personal history of colonic polyps: Secondary | ICD-10-CM | POA: Diagnosis not present

## 2019-11-15 DIAGNOSIS — J309 Allergic rhinitis, unspecified: Secondary | ICD-10-CM | POA: Insufficient documentation

## 2019-11-15 MED ORDER — FLUTICASONE PROPIONATE 50 MCG/ACT NA SUSP: 2.0000 | Freq: Every day | NASAL | 2 refills | Status: DC

## 2019-11-15 MED ORDER — AMLODIPINE BESYLATE 5 MG PO TABS: 5.0000 mg | ORAL_TABLET | Freq: Every day | ORAL | 2 refills | Status: DC

## 2019-11-15 NOTE — Progress Notes (Signed)
Virtual Visit via telephone Note  This visit type was conducted due to national recommendations for restrictions regarding the COVID-19 pandemic (e.g. social distancing).  This format is felt to be most appropriate for this patient at this time.  All issues noted in this document were discussed and addressed.  No physical exam was performed (except for noted visual exam findings with Video Visits).   I connected with Lauren [name] today at  8:30 AM EDT by telephone and verified that I am speaking with the correct person using two identifiers. Location patient: home Location provider: work Persons participating in the virtual visit: patient, provider  I discussed the limitations, risks, security and privacy concerns of performing an evaluation and management service by telephone and the availability of in person appointments. I also discussed with the patient that there may be a patient responsible charge related to this service. The patient expressed understanding and agreed to proceed.  Interactive audio and video telecommunications were attempted between this provider and patient, however failed, due to patient having technical difficulties OR patient did not have access to video capability.  We continued and completed visit with audio only.  Reason for visit: f/u  HPI: HYPERTENSION  Disease Monitoring  Home BP Monitoring [id number] systolic Chest pain- no    Dyspnea- no Medications  Compliance-  amlodipine. Edema- no  History of colon polyps: due for colonoscopy. Has family history of colon cancer in her daughter. She had 11 polyps last year and one year follow-up was recommended. No rectal bleeding.   Osteoporosis: on vitamin D. Not on prolia.   Allergic rhinitis: typical for this time of the year. Has rhinorrhea. Denies other symptoms. No medication now.    ROS: See pertinent positives and negatives per HPI.  Past Medical History:  Diagnosis Date  . Allergic rhinitis   . Chickenpox    . Elevated LDL cholesterol level 11/26/2015  . Gallstones   . Glaucoma   . HTN (hypertension)   . Hx of adenomatous and sessile serrated colonic polyps 09/02/2018  . Incontinence of urine in female 10/05/2015  . Kidney stones   . Migraines   . Urinary incontinence     Past Surgical History:  Procedure Laterality Date  . CHOLECYSTECTOMY  1995  . SACRAL NERVE STIMULATOR PLACEMENT  04/2018   To control overactive bladder  . TONSILLECTOMY  1962  . TUBAL LIGATION      Family History  Problem Relation Age of Onset  . Hematuria Father   . Heart disease Father   . Arthritis Father   . Heart disease Mother   . COPD Mother   . Atrial fibrillation Mother   . Hypertension Mother   . Colon cancer Daughter     SOCIAL HX: former smoker   Current Outpatient Medications:  .  amLODipine (NORVASC) 5 MG tablet, Take 1 tablet (5 mg total) by mouth daily. 1 tablet every other day, Disp: 90 tablet, Rfl: 2 .  Ascorbic Acid (VITAMIN C) 100 MG tablet, Take 100 mg by mouth daily., Disp: , Rfl:  .  fluticasone (FLONASE) 50 MCG/ACT nasal spray, Place 2 sprays into both nostrils daily., Disp: 16 g, Rfl: 2 .  latanoprost (XALATAN) 0.005 % ophthalmic solution, Place 1 drop into both eyes at bedtime. , Disp: , Rfl: 1  Current Facility-Administered Medications:  .  betamethasone acetate-betamethasone sodium phosphate (CELESTONE) injection 3 mg, 3 mg, Intramuscular, Once, Amalia Hailey, Dorathy Daft, DPM  EXAM: This was a telephone visit and thus no physical  exam was completed.   ASSESSMENT AND PLAN:  Discussed the following assessment and plan:  HTN (hypertension) Well controlled. Continue amlodipine.   Osteoporosis We will check in to starting on prolia. Message sent to [facility] to order. Discussed risk of osteonecrosis of the jaw and to contact us with any jaw symptoms.   Hx of adenomatous and sessile serrated colonic polyps Patient will call GI to schedule colonoscopy. Discussed the importance of having this  done.   Allergic rhinitis Trial of flonase. Has worked in the past.    No orders of the defined types were placed in this encounter.   Meds ordered this encounter  Medications  . fluticasone (FLONASE) 50 MCG/ACT nasal spray    Sig: Place 2 sprays into both nostrils daily.    Dispense:  16 g    Refill:  2  . amLODipine (NORVASC) 5 MG tablet    Sig: Take 1 tablet (5 mg total) by mouth daily. 1 tablet every other day    Dispense:  90 tablet    Refill:  2     I discussed the assessment and treatment plan with the patient. The patient was provided an opportunity to ask questions and all were answered. The patient agreed with the plan and demonstrated an understanding of the instructions.   The patient was advised to call back or seek an in-person evaluation if the symptoms worsen or if the condition fails to improve as anticipated.  I provided 21 minutes of non-face-to-face time during this encounter.   Tommi Rumps, MD

## 2019-11-15 NOTE — Assessment & Plan Note (Signed)
Trial of flonase. Has worked in the past.

## 2019-11-15 NOTE — Assessment & Plan Note (Signed)
Well controlled. Continue amlodipine 

## 2019-11-15 NOTE — Assessment & Plan Note (Signed)
We will check in to starting on prolia. Message sent to [facility] to order. Discussed risk of osteonecrosis of the jaw and to contact us with any jaw symptoms.

## 2019-11-15 NOTE — Assessment & Plan Note (Signed)
Patient will call GI to schedule colonoscopy. Discussed the importance of having this done.

## 2019-11-17 ENCOUNTER — Encounter: Payer: Self-pay | Admitting: Internal Medicine

## 2019-11-17 ENCOUNTER — Telehealth: Payer: Self-pay | Admitting: Family Medicine

## 2019-11-17 NOTE — Telephone Encounter (Signed)
I called and informed the pharmacy tech that the amlodipine should be taken daily.  Lauren [name],cma

## 2019-11-17 NOTE — Telephone Encounter (Signed)
Lauren [name] from [place] in [place] needs clarification on directions for amLODipine (NORVASC) 5 MG tablet

## 2019-11-23 ENCOUNTER — Telehealth: Payer: Self-pay | Admitting: *Deleted

## 2019-11-23 NOTE — Telephone Encounter (Signed)
Pharmacist, community for [facility] on [address]  [id number] [employer] ID

## 2019-11-23 NOTE — Telephone Encounter (Signed)
-----   Message from Leone Haven, MD sent at 11/15/2019  9:02 AM EDT ----- Can you check in to ordering prolia and getting it approved for this patient?

## 2019-11-24 NOTE — Telephone Encounter (Signed)
Patient has been approved for Prolia Ok to schedule?

## 2019-11-24 NOTE — Telephone Encounter (Signed)
It is ok to schedule her.

## 2019-11-25 DIAGNOSIS — K5520 Angiodysplasia of colon without hemorrhage: Secondary | ICD-10-CM

## 2019-11-25 HISTORY — DX: Angiodysplasia of colon without hemorrhage: K55.20

## 2019-12-06 ENCOUNTER — Telehealth: Payer: Self-pay | Admitting: *Deleted

## 2019-12-06 NOTE — Telephone Encounter (Signed)
Message left advising patient to call and reschedule pre-visit to avoid cancellation of colonoscopy on 12/22/19.

## 2019-12-07 NOTE — Telephone Encounter (Signed)
Patient has been scheduled for first Prolia injection on 12/14/19.

## 2019-12-14 ENCOUNTER — Other Ambulatory Visit: Payer: Self-pay

## 2019-12-14 ENCOUNTER — Ambulatory Visit (INDEPENDENT_AMBULATORY_CARE_PROVIDER_SITE_OTHER): Payer: PPO

## 2019-12-14 DIAGNOSIS — M810 Age-related osteoporosis without current pathological fracture: Secondary | ICD-10-CM

## 2019-12-14 DIAGNOSIS — E538 Deficiency of other specified B group vitamins: Secondary | ICD-10-CM

## 2019-12-14 MED ORDER — CYANOCOBALAMIN 1000 MCG/ML IJ SOLN
1000.0000 ug | Freq: Once | INTRAMUSCULAR | Status: AC
  Administered 2019-12-14: 1000 ug via INTRAMUSCULAR

## 2019-12-14 MED ORDER — DENOSUMAB 60 MG/ML ~~LOC~~ SOSY
60.0000 mg | PREFILLED_SYRINGE | Freq: Once | SUBCUTANEOUS | Status: AC
  Administered 2019-12-14: 60 mg via SUBCUTANEOUS

## 2019-12-14 NOTE — Progress Notes (Signed)
Patient presented for B 12 injection to right deltoid, patient voiced no concerns nor showed any signs of distress during injection.  Patient presented for 6 month Prolia injection SQ to left arm. Patient tolerated well.

## 2019-12-17 ENCOUNTER — Ambulatory Visit (AMBULATORY_SURGERY_CENTER): Payer: Self-pay | Admitting: *Deleted

## 2019-12-17 ENCOUNTER — Other Ambulatory Visit: Payer: Self-pay

## 2019-12-17 VITALS — Temp 97.7°F | Ht 62.0 in | Wt 144.6 lb

## 2019-12-17 DIAGNOSIS — Z86010 Personal history of colonic polyps: Secondary | ICD-10-CM

## 2019-12-17 NOTE — Progress Notes (Signed)
Patient denies any allergies to egg or soy products. Patient denies complications with anesthesia/sedation.  Patient denies oxygen use at home and denies diet medications. Patient had both covid vaccinations, last one on 11/09/19.

## 2019-12-22 ENCOUNTER — Encounter: Payer: Self-pay | Admitting: Internal Medicine

## 2019-12-22 ENCOUNTER — Other Ambulatory Visit: Payer: Self-pay

## 2019-12-22 ENCOUNTER — Ambulatory Visit (AMBULATORY_SURGERY_CENTER): Payer: PPO | Admitting: Internal Medicine

## 2019-12-22 VITALS — BP 131/64 | HR 76 | Temp 96.9°F | Resp 19 | Ht 62.0 in | Wt 144.0 lb

## 2019-12-22 DIAGNOSIS — D124 Benign neoplasm of descending colon: Secondary | ICD-10-CM

## 2019-12-22 DIAGNOSIS — D122 Benign neoplasm of ascending colon: Secondary | ICD-10-CM | POA: Diagnosis not present

## 2019-12-22 DIAGNOSIS — Z86010 Personal history of colonic polyps: Secondary | ICD-10-CM | POA: Diagnosis not present

## 2019-12-22 DIAGNOSIS — K635 Polyp of colon: Secondary | ICD-10-CM | POA: Diagnosis not present

## 2019-12-22 MED ORDER — SODIUM CHLORIDE 0.9 % IV SOLN: 500.0000 mL | Freq: Once | INTRAVENOUS | Status: DC

## 2019-12-22 NOTE — Op Note (Signed)
[facility] Patient Name: Lauren [name] Procedure Date: 12/22/2019 8:02 AM MRN: [id number] Endoscopist: Gatha Mayer , MD Age: 79 Referring MD:  Date of Birth: Mar 30, 1941 Gender: Female Account #: [id number] Procedure:                Colonoscopy Indications:              Surveillance: History of numerous (> 10) adenomas                            on last colonoscopy (< 3 yrs) Medicines:                Propofol per Anesthesia, Monitored Anesthesia Care Procedure:                Pre-Anesthesia Assessment:                           - Prior to the procedure, a History and Physical                            was performed, and patient medications and                            allergies were reviewed. The patient's tolerance of                            previous anesthesia was also reviewed. The risks                            and benefits of the procedure and the sedation                            options and risks were discussed with the patient.                            All questions were answered, and informed consent                            was obtained. Prior Anticoagulants: The patient has                            taken no previous anticoagulant or antiplatelet                            agents. ASA Grade Assessment: II - A patient with                            mild systemic disease. After reviewing the risks                            and benefits, the patient was deemed in                            satisfactory condition to undergo the procedure.  After obtaining informed consent, the colonoscope                            was passed under direct vision. Throughout the                            procedure, the patient's blood pressure, pulse, and                            oxygen saturations were monitored continuously. The                            Colonoscope was introduced through the anus and                            advanced  to the the cecum, identified by                            appendiceal orifice and ileocecal valve. The                            colonoscopy was performed without difficulty. The                            patient tolerated the procedure well. The quality                            of the bowel preparation was good. The ileocecal                            valve, appendiceal orifice, and rectum were                            photographed. The bowel preparation used was                            Miralax via split dose instruction. Scope In: 8:12:08 AM Scope Out: 8:32:23 AM Scope Withdrawal Time: 0 hours 17 minutes 1 second  Total Procedure Duration: 0 hours 20 minutes 15 seconds  Findings:                 The perianal and digital rectal examinations were                            normal.                           A 12 mm polyp was found in the ascending colon. The                            polyp was sessile. The polyp was removed with a                            piecemeal technique using a cold snare. Resection  and retrieval were complete. Verification of                            patient identification for the specimen was done.                            Estimated blood loss was minimal.                           Two sessile polyps were found in the descending                            colon. The polyps were diminutive in size. These                            polyps were removed with a cold snare. Resection                            and retrieval were complete. Verification of                            patient identification for the specimen was done.                            Estimated blood loss was minimal.                           Many diverticula were found in the sigmoid colon                            and descending colon.                           A single small angiodysplastic lesion without                            bleeding was found  in the cecum.                           The exam was otherwise without abnormality on                            direct and retroflexion views. Complications:            No immediate complications. Estimated Blood Loss:     Estimated blood loss was minimal. Impression:               - One 12 mm polyp in the ascending colon, removed                            piecemeal using a cold snare. Resected and                            retrieved.                           -  Two diminutive polyps in the descending colon,                            removed with a cold snare. Resected and retrieved.                           - Diverticulosis in the sigmoid colon and in the                            descending colon.                           - A single non-bleeding colonic angiodysplastic                            lesion.                           - The examination was otherwise normal on direct                            and retroflexion views.                           - Personal history of colonic polyps. Numerous                            adenomas last time 2020 Recommendation:           - Patient has a contact number available for                            emergencies. The signs and symptoms of potential                            delayed complications were discussed with the                            patient. Return to normal activities tomorrow.                            Written discharge instructions were provided to the                            patient.                           - Resume previous diet.                           - Continue present medications.                           - Repeat colonoscopy is recommended for                            surveillance. The colonoscopy date will be  determined after pathology results from today's                            exam become available for review. Gatha Mayer, MD 12/22/2019 8:42:49 AM This report  has been signed electronically.

## 2019-12-22 NOTE — Progress Notes (Signed)
A/ox3, pleased with MAC, report to RN 

## 2019-12-22 NOTE — Progress Notes (Signed)
Temp check by:JB Vital check by:CW  The patient states no changes in medical or surgical history since pre-visit screening on 12/17/2019.

## 2019-12-22 NOTE — Progress Notes (Signed)
Called to room to assist during endoscopic procedure.  Patient ID and intended procedure confirmed with present staff. Received instructions for my participation in the procedure from the performing physician.  

## 2019-12-22 NOTE — Patient Instructions (Addendum)
I found and removed 3 polyps. There is still diverticulosis and there is an abnormal blood vessel collection growing on the surface called angiodysplasia or AVM (arteriovenous malformation).   I am not sure what to say regarding whether you should repeat a colonoscopy - I think we will talk about it in 3 years.  I appreciate the opportunity to care for you. Gatha Mayer, MD, FACG   YOU HAD AN ENDOSCOPIC PROCEDURE TODAY AT [facility]:   Refer to the procedure report that was given to you for any specific questions about what was found during the examination.  If the procedure report does not answer your questions, please call your gastroenterologist to clarify.  If you requested that your care partner not be given the details of your procedure findings, then the procedure report has been included in a sealed envelope for you to review at your convenience later.  YOU SHOULD EXPECT: Some feelings of bloating in the abdomen. Passage of more gas than usual.  Walking can help get rid of the air that was put into your GI tract during the procedure and reduce the bloating. If you had a lower endoscopy (such as a colonoscopy or flexible sigmoidoscopy) you may notice spotting of blood in your stool or on the toilet paper. If you underwent a bowel prep for your procedure, you may not have a normal bowel movement for a few days.  Please Note:  You might notice some irritation and congestion in your nose or some drainage.  This is from the oxygen used during your procedure.  There is no need for concern and it should clear up in a day or so.  SYMPTOMS TO REPORT IMMEDIATELY:   Following lower endoscopy (colonoscopy or flexible sigmoidoscopy):  Excessive amounts of blood in the stool  Significant tenderness or worsening of abdominal pains  Swelling of the abdomen that is new, acute  Fever of 100F or higher   For urgent or emergent issues, a gastroenterologist can be reached at any  hour by calling [phone]. Do not use MyChart messaging for urgent concerns.    DIET:  We do recommend a small meal at first, but then you may proceed to your regular diet.  Drink plenty of fluids but you should avoid alcoholic beverages for 24 hours.  ACTIVITY:  You should plan to take it easy for the rest of today and you should NOT DRIVE or use heavy machinery until tomorrow (because of the sedation medicines used during the test).    FOLLOW UP: Our staff will call the number listed on your records 48-72 hours following your procedure to check on you and address any questions or concerns that you may have regarding the information given to you following your procedure. If we do not reach you, we will leave a message.  We will attempt to reach you two times.  During this call, we will ask if you have developed any symptoms of COVID 19. If you develop any symptoms (ie: fever, flu-like symptoms, shortness of breath, cough etc.) before then, please call [phone].  If you test positive for Covid 19 in the 2 weeks post procedure, please call and report this information to [place].    If any biopsies were taken you will be contacted by phone or by letter within the next 1-3 weeks.  Please call us at [phone] if you have not heard about the biopsies in 3 weeks.    SIGNATURES/CONFIDENTIALITY: You and/or your  care partner have signed paperwork which will be entered into your electronic medical record.  These signatures attest to the fact that that the information above on your After Visit Summary has been reviewed and is understood.  Full responsibility of the confidentiality of this discharge information lies with you and/or your care-partner.

## 2019-12-23 ENCOUNTER — Ambulatory Visit: Payer: PPO

## 2019-12-24 ENCOUNTER — Encounter: Payer: Self-pay | Admitting: Internal Medicine

## 2019-12-24 ENCOUNTER — Telehealth: Payer: Self-pay

## 2019-12-24 ENCOUNTER — Telehealth: Payer: Self-pay | Admitting: *Deleted

## 2019-12-24 DIAGNOSIS — Z86010 Personal history of colonic polyps: Secondary | ICD-10-CM

## 2019-12-24 NOTE — Telephone Encounter (Signed)
Attempted f/u phone call. No answer. Left message. °

## 2019-12-24 NOTE — Telephone Encounter (Signed)
Second follow up phone call attempt, no answer, LM 

## 2020-01-18 ENCOUNTER — Ambulatory Visit (INDEPENDENT_AMBULATORY_CARE_PROVIDER_SITE_OTHER): Payer: PPO

## 2020-01-18 ENCOUNTER — Other Ambulatory Visit: Payer: Self-pay

## 2020-01-18 DIAGNOSIS — E538 Deficiency of other specified B group vitamins: Secondary | ICD-10-CM

## 2020-01-18 MED ORDER — CYANOCOBALAMIN 1000 MCG/ML IJ SOLN
1000.0000 ug | Freq: Once | INTRAMUSCULAR | Status: AC
  Administered 2020-01-18: 1000 ug via INTRAMUSCULAR

## 2020-01-18 NOTE — Progress Notes (Signed)
Patient presented for B 12 injection to right deltoid, patient voiced no concerns nor showed any signs of distress during injection. 

## 2020-01-19 ENCOUNTER — Encounter: Payer: Self-pay | Admitting: Family Medicine

## 2020-01-19 ENCOUNTER — Telehealth: Payer: Self-pay | Admitting: Family Medicine

## 2020-01-19 ENCOUNTER — Ambulatory Visit (INDEPENDENT_AMBULATORY_CARE_PROVIDER_SITE_OTHER): Payer: PPO | Admitting: Family Medicine

## 2020-01-19 DIAGNOSIS — I10 Essential (primary) hypertension: Secondary | ICD-10-CM

## 2020-01-19 DIAGNOSIS — M25512 Pain in left shoulder: Secondary | ICD-10-CM | POA: Diagnosis not present

## 2020-01-19 DIAGNOSIS — R238 Other skin changes: Secondary | ICD-10-CM | POA: Diagnosis not present

## 2020-01-19 DIAGNOSIS — H409 Unspecified glaucoma: Secondary | ICD-10-CM | POA: Diagnosis not present

## 2020-01-19 MED ORDER — AMLODIPINE BESYLATE 5 MG PO TABS: 5.0000 mg | ORAL_TABLET | Freq: Every day | ORAL | 2 refills | Status: DC

## 2020-01-19 NOTE — Assessment & Plan Note (Signed)
Possibly related to the number of injection she had in the same deltoid muscle.  Also discussed the potential risk for injection near her joint and rotator cuff and that could cause an issue with pain.  Discussed having her see sports medicine for this though she defers at this time.  She will monitor for 1 to 2 weeks and if she still having discomfort she can contact her sports medicine doctor for a visit for an ultrasound.

## 2020-01-19 NOTE — Patient Instructions (Signed)
Nice to see you. Please watch the spot on your right leg.  If it worsens again please let us know. Please monitor your left shoulder.  If it is not resolved within 1 to 2 weeks please contact Dr. Tamala Julian for a visit.

## 2020-01-19 NOTE — Telephone Encounter (Signed)
[facility] called they need clarification direction on the amLODipine (NORVASC) 5 MG tablet

## 2020-01-19 NOTE — Telephone Encounter (Signed)
I called the pharmacist and gave clarification on amlodipine, patient takes every other day.  Anais Koenen,cma

## 2020-01-19 NOTE — Assessment & Plan Note (Signed)
Chronic issue.  She follows with ophthalmology for this.  She requested a refill of Xalatan today though I advised that this needs to come from her ophthalmologist.

## 2020-01-19 NOTE — Assessment & Plan Note (Signed)
Adequately controlled at home.  She will continue her current regimen.

## 2020-01-19 NOTE — Assessment & Plan Note (Signed)
Has improved quite a bit.  I do not believe she has any infection.  Possibly she was bitten or stung by something and did not realize it.  She will monitor.  If it worsens she will let us know.

## 2020-01-19 NOTE — Progress Notes (Signed)
Tommi Rumps, MD Phone: [phone]  Lauren [name] is a 79 y.o. female who presents today for same day visit.   Right leg nodule: Notes this occurred yesterday.  It was on her right mid lateral shin.  It was 3 to 4 cm in diameter.  It was stinging.  She notes it is much improved today.  There is 1/2 cm area of redness.  No fevers.  No drainage.  She does not remember getting bitten or stung by anything.    Hypertension: Typically running 0000000 systolically.  Taking amlodipine every other day and this seems to work well.  No chest pain, shortness of breath, or edema.  Glaucoma: Uses Xalatan.  Follows with ophthalmology.  Recently saw them.  Left shoulder pain: Patient notes within the last month or so she got her second Covid vaccine in her left arm, a B12 injection the next day in her left arm into Prolia injection at some point in her left arm.  Over that timeframe she has developed pain in her left deltoid with abduction and external rotation.  No injury.  Social History   Tobacco Use  Smoking Status Former Smoker  . Types: Cigarettes  . Quit date: 08/06/1993  . Years since quitting: 26.4  Smokeless Tobacco Never Used     ROS see history of present illness  Objective  Physical Exam Vitals:   01/19/20 0857 01/19/20 0922  BP: (!) 150/80 140/80  Pulse: 78   Temp: (!) 97.1 F (36.2 C)   SpO2: 98%     BP Readings from Last 3 Encounters:  01/19/20 140/80  12/22/19 131/64  08/31/19 120/74   Wt Readings from Last 3 Encounters:  01/19/20 145 lb (65.8 kg)  12/22/19 144 lb (65.3 kg)  12/17/19 144 lb 9.6 oz (65.6 kg)    Physical Exam Constitutional:      General: She is not in acute distress.    Appearance: She is not diaphoretic.  Cardiovascular:     Rate and Rhythm: Normal rate and regular rhythm.     Heart sounds: Normal heart sounds.  Pulmonary:     Effort: Pulmonary effort is normal.     Breath sounds: Normal breath sounds.  Musculoskeletal:      Comments: Tenderness over her left deltoid, no palpable lesions, discomfort on active and passive abduction and external rotation, negative empty can bilaterally, full range of motion bilateral shoulders  Skin:    General: Skin is warm and dry.       Neurological:     Mental Status: She is alert.      Assessment/Plan: Please see individual problem list.  HTN (hypertension) Adequately controlled at home.  She will continue her current regimen.  Left shoulder pain Possibly related to the number of injection she had in the same deltoid muscle.  Also discussed the potential risk for injection near her joint and rotator cuff and that could cause an issue with pain.  Discussed having her see sports medicine for this though she defers at this time.  She will monitor for 1 to 2 weeks and if she still having discomfort she can contact her sports medicine doctor for a visit for an ultrasound.  Papule of skin Has improved quite a bit.  I do not believe she has any infection.  Possibly she was bitten or stung by something and did not realize it.  She will monitor.  If it worsens she will let us know.  Glaucoma Chronic issue.  She follows  with ophthalmology for this.  She requested a refill of Xalatan today though I advised that this needs to come from her ophthalmologist.  No orders of the defined types were placed in this encounter.   Meds ordered this encounter  Medications  . amLODipine (NORVASC) 5 MG tablet    Sig: Take 1 tablet (5 mg total) by mouth daily. 1 tablet every other day    Dispense:  90 tablet    Refill:  2    This visit occurred during the SARS-CoV-2 public health emergency.  Safety protocols were in place, including screening questions prior to the visit, additional usage of staff PPE, and extensive cleaning of exam room while observing appropriate contact time as indicated for disinfecting solutions.    Tommi Rumps, MD [place]

## 2020-02-08 ENCOUNTER — Other Ambulatory Visit: Payer: Self-pay

## 2020-02-08 ENCOUNTER — Encounter: Payer: Self-pay | Admitting: Family Medicine

## 2020-02-08 ENCOUNTER — Ambulatory Visit: Payer: Self-pay

## 2020-02-08 ENCOUNTER — Ambulatory Visit (INDEPENDENT_AMBULATORY_CARE_PROVIDER_SITE_OTHER): Payer: PPO | Admitting: Family Medicine

## 2020-02-08 VITALS — BP 144/62 | HR 86 | Ht 62.0 in | Wt 152.0 lb

## 2020-02-08 DIAGNOSIS — M75102 Unspecified rotator cuff tear or rupture of left shoulder, not specified as traumatic: Secondary | ICD-10-CM | POA: Insufficient documentation

## 2020-02-08 DIAGNOSIS — M79602 Pain in left arm: Secondary | ICD-10-CM

## 2020-02-08 DIAGNOSIS — M75122 Complete rotator cuff tear or rupture of left shoulder, not specified as traumatic: Secondary | ICD-10-CM | POA: Diagnosis not present

## 2020-02-08 NOTE — Patient Instructions (Signed)
Good to see you See me again in 6-8 weeks 

## 2020-02-08 NOTE — Progress Notes (Signed)
[place] Phone: [phone] Subjective:   [name], am serving as a scribe for Dr. Hulan Saas. This visit occurred during the SARS-CoV-2 public health emergency.  Safety protocols were in place, including screening questions prior to the visit, additional usage of staff PPE, and extensive cleaning of exam room while observing appropriate contact time as indicated for disinfecting solutions.   I'm seeing this patient by the request  of:  Leone Haven, MD  CC: Shoulder pain  [id number]   08/31/2019 Repeat injection given again today.  Loss of transverse arch and rigid midfoot Sowles causing some of the discomfort and pain as well.  Went for more of the joint today.  Gust icing regimen and home exercise, discussed other medications which patient has declined to take on a regular basis.  Discussed proper shoe choices and over-the-counter orthotics.  Follow-up again in 4 to 8 weeks  Update 02/08/2020 Lauren [name] is a 79 y.o. female coming in with complaint of left arm pain. Had COVID, B12 and Prolia injections. Patient feels a knot in deltoid and pain with external rotation. Denies any radiating symptoms.  Patient states that it can wake her up at night.  Has noticed maybe some mild weakness or at least guarding.  Patient denies any significant radiation but seems to hurt her and down the arm.  Denies any associated neck pain with it.    Past Medical History:  Diagnosis Date  . Allergic rhinitis   . Allergy   . Cataract    removed by surgery  . 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 Chickenpox   . Elevated LDL cholesterol level 11/26/2015   no meds, diet controlled  . Gallstones    removed by surgery  . Glaucoma   . HTN (hypertension)   . Hx of adenomatous and sessile serrated colonic polyps 09/02/2018  . Incontinence of urine in female 10/05/2015  . Kidney stones    removed with gallbladder surgery per  patient  . Migraines   . Urinary incontinence    Past Surgical History:  Procedure Laterality Date  . CHOLECYSTECTOMY  1995  . COLONOSCOPY  08/27/2018   TA polyps/ [facility]essner  . EYE SURGERY Bilateral    cataracts removed/laser  . SACRAL NERVE STIMULATOR PLACEMENT  04/2018   To control overactive bladder  . TONSILLECTOMY  1962  . TUBAL LIGATION     Social History   Socioeconomic History  . Marital status: Widowed    Spouse name: Not on file  . Number of children: 2  . Years of education: Not on file  . Highest education level: Not on file  Occupational History  . Occupation: retired  Tobacco Use  . Smoking status: Former Smoker    Types: Cigarettes    Quit date: 08/06/1993    Years since quitting: 26.5  . Smokeless tobacco: Never Used  Vaping Use  . Vaping Use: Never used  Substance and Sexual Activity  . Alcohol use: No    Alcohol/week: 0.0 standard drinks  . Drug use: No  . Sexual activity: Not Currently    Birth control/protection: Post-menopausal  Other Topics Concern  . Not on file  Social History Narrative   Widowed   1 son and 1 daughter   She is a retired Radiation protection practitioner   Former smoker   No alcohol or caffeine or drugs or any tobacco   Social Determinants of Radio broadcast assistant Strain:   .  Difficulty of Paying Living Expenses:   Food Insecurity:   . Worried About Charity fundraiser in the Last Year:   . Arboriculturist in the Last Year:   Transportation Needs:   . Film/video editor (Medical):   Marland Kitchen Lack of Transportation (Non-Medical):   Physical Activity:   . Days of Exercise per Week:   . Minutes of Exercise per Session:   Stress:   . Feeling of Stress :   Social Connections:   . Frequency of Communication with Friends and Family:   . Frequency of Social Gatherings with Friends and Family:   . Attends Religious Services:   . Active Member of Clubs or Organizations:   . Attends Archivist Meetings:   Marland Kitchen Marital Status:     Allergies  Allergen Reactions  . Egg [Eggs Or Egg-Derived Products]   . Gabapentin Other (See Comments)    Unable to sleep  . Hydrochlorothiazide Other (See Comments)    Leg pain and aches  . Hydrocodone-Guaifenesin Other (See Comments)    Cant sleep   . Influenza Vaccines Other (See Comments)    Nausea and vomiting and stomach pain for 10 weeks  . Mobic [Meloxicam] Swelling  . Oxybutynin     Memory loss per patient  . Sulfa Antibiotics Other (See Comments)    Cold sweats, passing out   . Codeine Rash    Cold sweats    Family History  Problem Relation Age of Onset  . Hematuria Father   . Heart disease Father   . Arthritis Father   . Heart disease Mother   . COPD Mother   . Atrial fibrillation Mother   . Hypertension Mother   . Colon cancer Daughter   . Rectal cancer Neg Hx   . Stomach cancer Neg Hx   . Esophageal cancer Neg Hx      Current Facility-Administered Medications (Endocrine & Metabolic):  .  betamethasone acetate-betamethasone sodium phosphate (CELESTONE) injection 3 mg  Current Outpatient Medications (Cardiovascular):  .  amLODipine (NORVASC) 5 MG tablet, Take 1 tablet (5 mg total) by mouth daily. 1 tablet every other day   Current Outpatient Medications (Respiratory):  .  fluticasone (FLONASE) 50 MCG/ACT nasal spray, Place 2 sprays into both nostrils daily.       Current Outpatient Medications (Other):  Marland Kitchen  Ascorbic Acid (VITAMIN C) 100 MG tablet, Take 100 mg by mouth daily. Marland Kitchen  latanoprost (XALATAN) 0.005 % ophthalmic solution, Place 1 drop into both eyes at bedtime.  .  NON FORMULARY, Vitamin B 12 Injections q month, Last injection 11/09/19    Reviewed prior external information including notes and imaging from  primary care provider As well as notes that were available from care everywhere and other healthcare systems.  Past medical history, social, surgical and family history all reviewed in electronic medical record.  No pertanent  information unless stated regarding to the chief complaint.   Review of Systems:  No headache, visual changes, nausea, vomiting, diarrhea, constipation, dizziness, abdominal pain, skin rash, fevers, chills, night sweats, weight loss, swollen lymph nodes, body aches, joint swelling, chest pain, shortness of breath, mood changes. POSITIVE muscle aches  Objective  Blood pressure (!) 144/62, pulse 86, height 5\' 2"  (1.575 m), weight 152 lb (68.9 kg), SpO2 99 %.   General: No apparent distress alert and oriented x3 mood and affect normal, dressed appropriately.  HEENT: Pupils equal, extraocular movements intact  Respiratory: Patient's speak in full sentences and  does not appear short of breath  Cardiovascular: Trace lower extremity edema, non tender, no erythema  Neuro: Cranial nerves II through XII are intact, neurovascularly intact in all extremities with 2+ DTRs and 1+ pulses.  Gait very mild antalgic Left shoulder exam shows some mild balance tenderness to palpation diffusely of the shoulder.  Mild positive pain with external rotation but near full range of motion.  4 out of 5 strength of the rotator cuff compared to the contralateral side with mild positive  Limited musculoskeletal ultrasound was performed and interpreted by Lyndal Pulley  Limited ultrasound of patient's shoulder shows that patient does have a rotator cuff tear noted.  Mild retraction noted.  Nearly high-grade.  Calcific changes noted as well.  Seems to be chronic overall.  Impression: Chronic rotator cuff tear with mild retraction  Procedure: Real-time Ultrasound Guided Injection of left glenohumeral joint Device: GE Logiq E  Ultrasound guided injection is preferred based studies that show increased duration, increased effect, greater accuracy, decreased procedural pain, increased response rate with ultrasound guided versus blind injection.  Verbal informed consent obtained.  Time-out conducted.  Noted no overlying  erythema, induration, or other signs of local infection.  Skin prepped in a sterile fashion.  Local anesthesia: Topical Ethyl chloride.  With sterile technique and under real time ultrasound guidance:  Joint visualized.  21g 2 inch needle inserted posterior approach. Pictures taken for needle placement. Patient did have injection of 2 cc of 0.5% Marcaine, and 1cc of Kenalog 40 mg/dL. Completed without difficulty  Pain immediately resolved suggesting accurate placement of the medication.  Advised to call if fevers/chills, erythema, induration, drainage, or persistent bleeding.  Images permanently stored and available for review in the ultrasound unit.  Impression: Technically successful ultrasound guided injection.   Impression and Recommendations:     The above documentation has been reviewed and is accurate and complete Lyndal Pulley, DO       Note: This dictation was prepared with Dragon dictation along with smaller phrase technology. Any transcriptional errors that result from this process are unintentional.

## 2020-02-10 ENCOUNTER — Other Ambulatory Visit: Payer: Self-pay | Admitting: Family Medicine

## 2020-02-17 IMAGING — US US BREAST*L* LIMITED INC AXILLA
1 series · 13 of 20 positions shown · non-contrast
Comparison: Previous exam(s).

CLINICAL DATA: Left breast upper outer quadrant distortion seen
mammographically.

EXAM:
DIGITAL DIAGNOSTIC LEFT MAMMOGRAM WITH CAD AND TOMO
ULTRASOUND LEFT BREAST

[Series 1: us breast*left* limited inc axilla · 0.06mm/px · 13 of 20 slices shown]
[im 1/20]
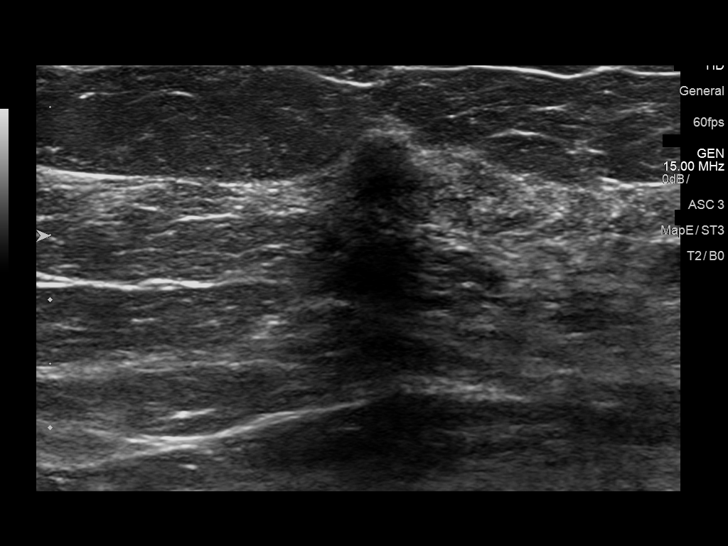
[im 3/20]
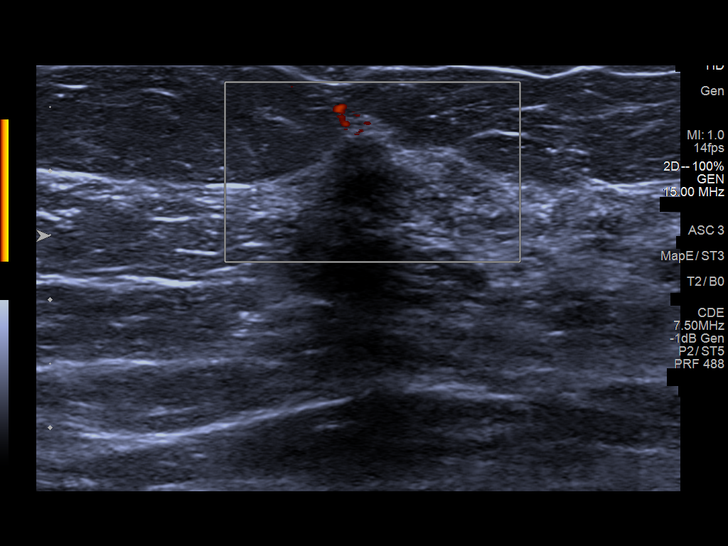
[im 4/20]
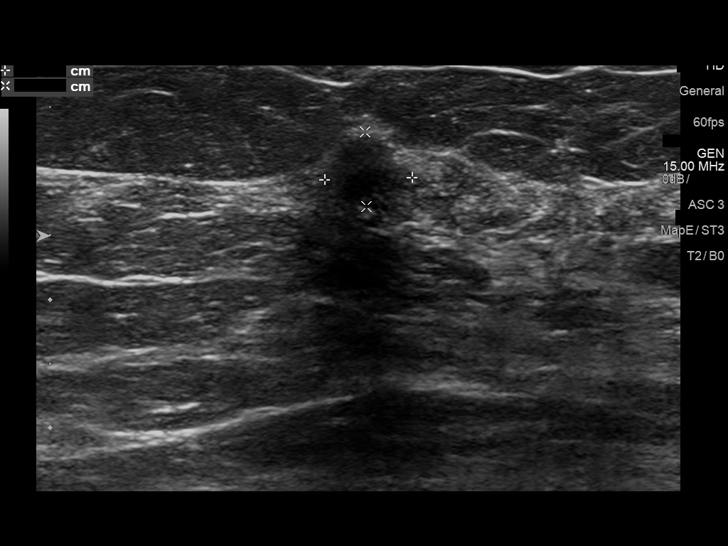
[im 6/20]
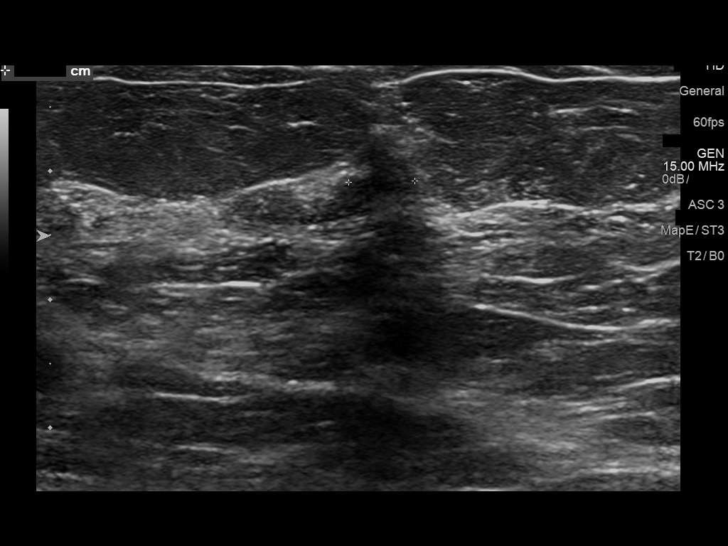
[im 7/20]
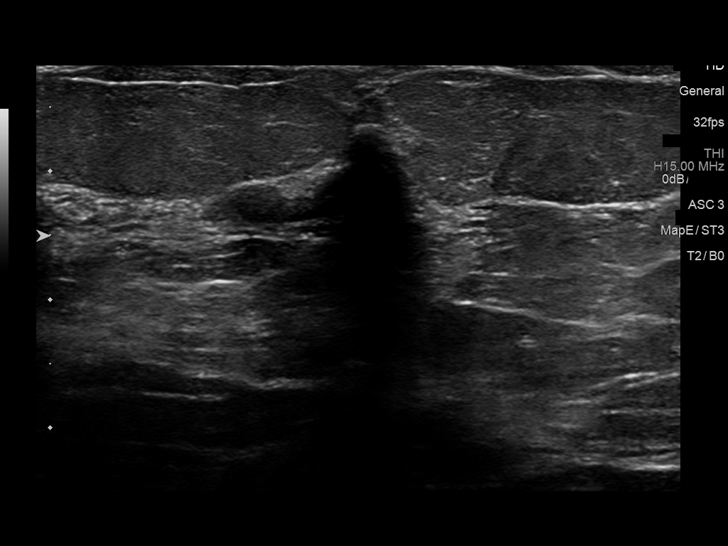
[im 9/20]
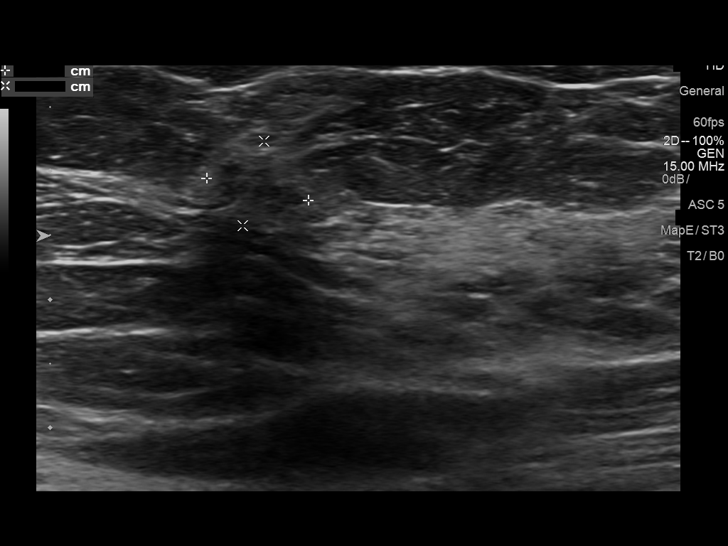
[im 11/20]
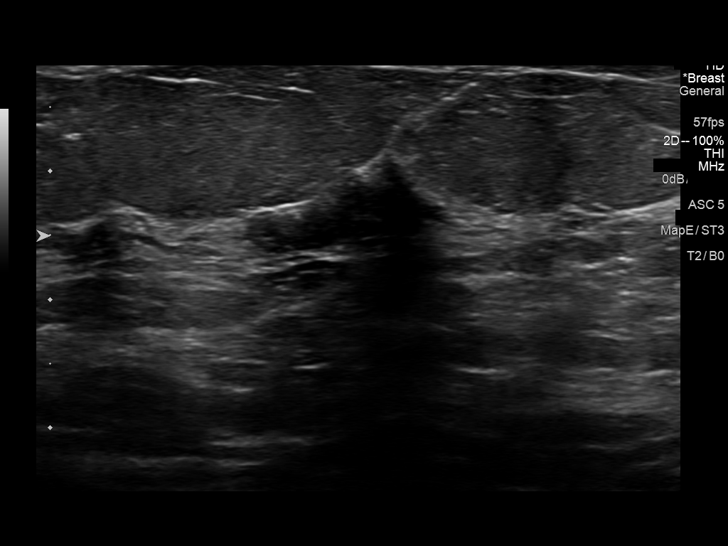
[im 12/20]
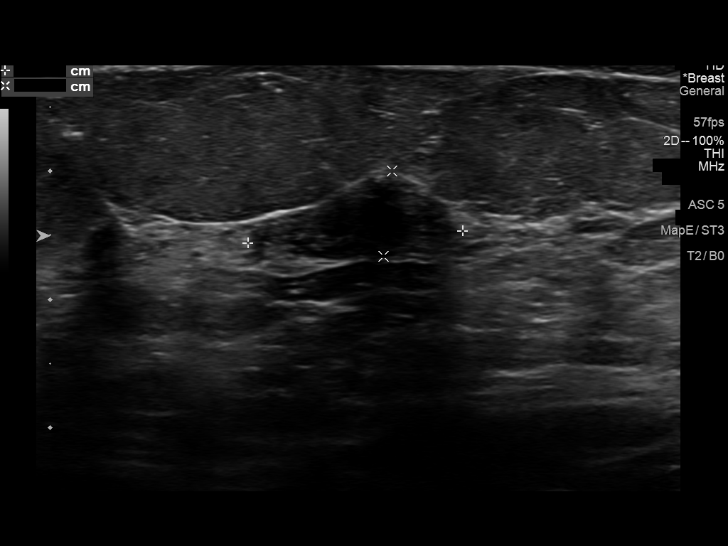
[im 14/20]
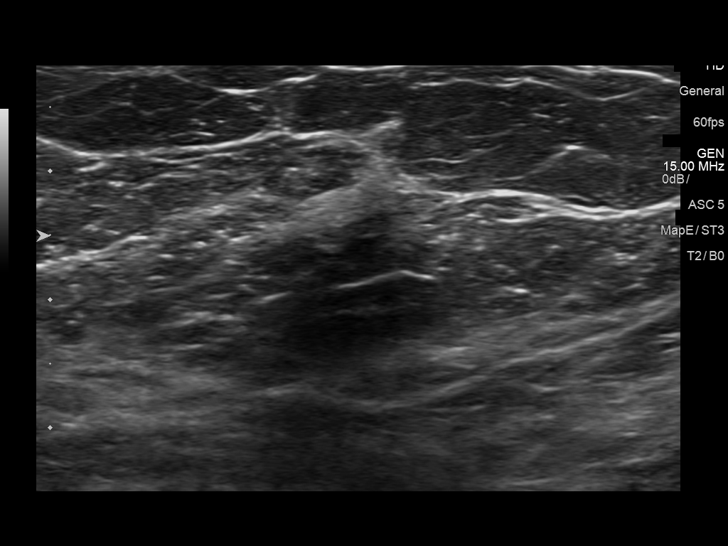
[im 15/20]
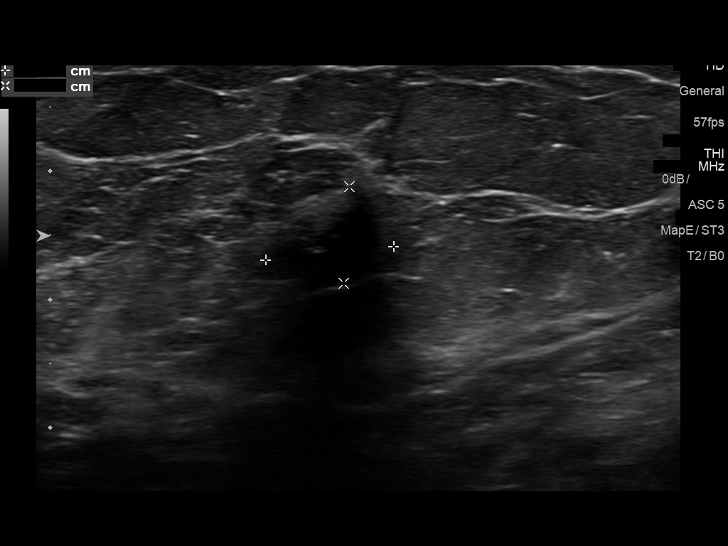
[im 17/20]
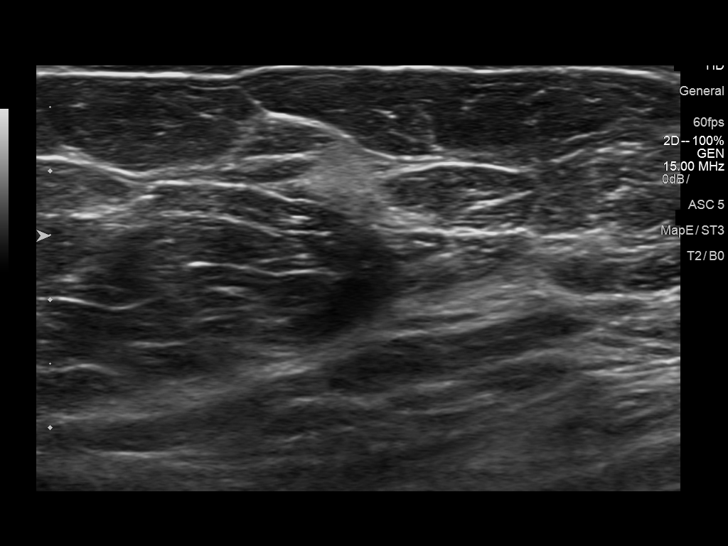
[im 18/20]
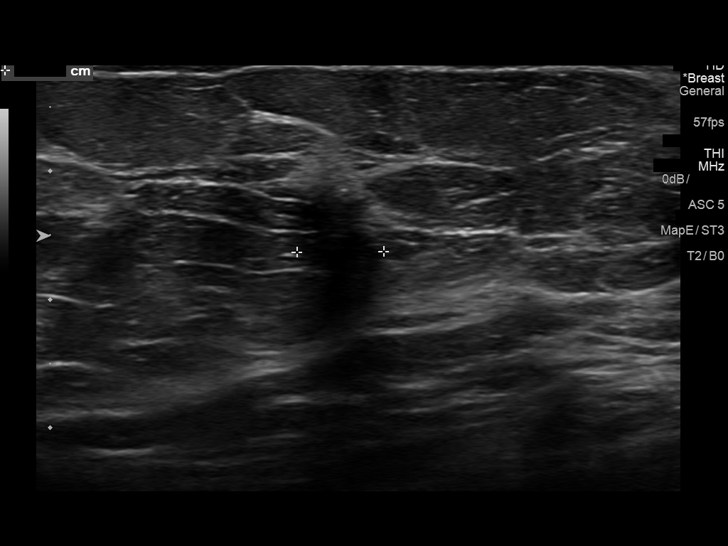
[im 20/20]
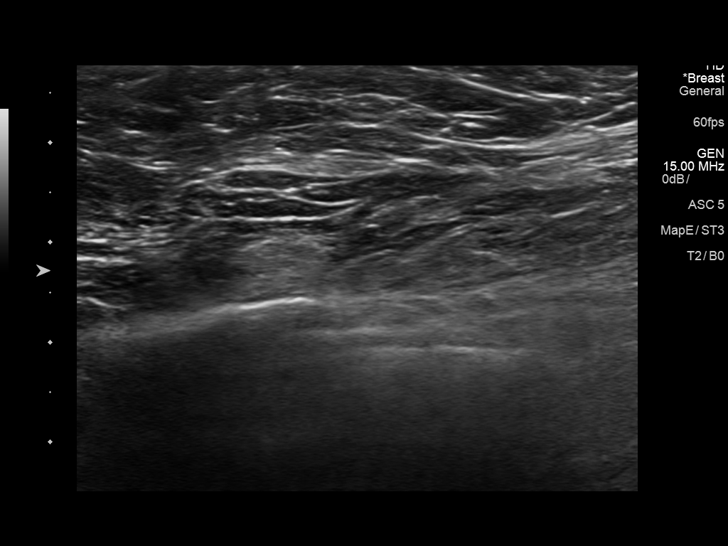

[13 of 20 positions shown; findings below may reference images not displayed]

ACR Breast Density Category b: There are scattered areas of
fibroglandular density.
FINDINGS: Additional mammographic views of the left breast demonstrate
ill-defined regional distortion in the left breast upper outer
quadrant, middle depth, which measures approximately 6.0 x 5.4 x
cm.

Mammographic images were processed with CAD.

On physical exam, no suspicious masses are palpated.

Targeted ultrasound is performed, showing left breast 2 o'clock 2 cm
from the nipple hypoechoic mass which measures 0.7 x 0.6 by 0.5 cm.
Immediately adjacent to it there is a more ill-defined irregular
hypoechoic area which measures 0.8 x 0.7 x 1.7 cm, labeled at 2
o'clock 3 cm from the nipple. Additionally, in the left breast 3
o'clock 5 cm from the nipple there is an ill-defined hypoechoic mass
versus fibrotic tissue which measures 1.0 x 0.8 by 0.7 cm. No
evidence of left axillary lymphadenopathy.
IMPRESSION: Left breast upper outer quadrant architectural distortion, with 2
areas of sonographic correlation at the left breast 2 o'clock and 3
o'clock. Differential diagnosis includes lobular carcinoma, complex
sclerosing lesions or benign sclerosing mastopathy.

RECOMMENDATION:
Ultrasound-guided core needle biopsy of the left breast 2 o'clock
and 3 o'clock areas.

I have discussed the findings and recommendations with the patient.
Results were also provided in writing at the conclusion of the
visit. If applicable, a reminder letter will be sent to the patient
regarding the next appointment.

BI-RADS CATEGORY  4: Suspicious.

## 2020-02-18 ENCOUNTER — Telehealth: Payer: Self-pay | Admitting: Family Medicine

## 2020-02-18 NOTE — Telephone Encounter (Signed)
Pt called in and ask about her appointment on 6/29. I let her know it was a nurse visit. Then she proceeds to tell me that she received a long message from Dr. Caryl Bis about a phone visit and that if she didn't respond she would be charged $40. I am unsure of what letter she received and told her do no pay this amount. She said that is why she called because she was confused on why she was getting. I told her it could be a scam and to not respond.

## 2020-02-22 ENCOUNTER — Other Ambulatory Visit: Payer: Self-pay

## 2020-02-22 ENCOUNTER — Ambulatory Visit (INDEPENDENT_AMBULATORY_CARE_PROVIDER_SITE_OTHER): Payer: PPO

## 2020-02-22 DIAGNOSIS — E538 Deficiency of other specified B group vitamins: Secondary | ICD-10-CM

## 2020-02-22 MED ORDER — CYANOCOBALAMIN 1000 MCG/ML IJ SOLN
1000.0000 ug | Freq: Once | INTRAMUSCULAR | Status: AC
Start: 2020-02-22 — End: 2020-02-22
  Administered 2020-02-22: 1000 ug via INTRAMUSCULAR

## 2020-02-22 NOTE — Progress Notes (Signed)
Patient presented for B 12 injection to right deltoid, patient voiced no concerns nor showed any signs of distress during injection. 

## 2020-03-28 ENCOUNTER — Encounter: Payer: Self-pay | Admitting: Family Medicine

## 2020-03-28 ENCOUNTER — Ambulatory Visit: Payer: Self-pay

## 2020-03-28 ENCOUNTER — Other Ambulatory Visit: Payer: Self-pay

## 2020-03-28 ENCOUNTER — Ambulatory Visit: Payer: PPO | Admitting: Family Medicine

## 2020-03-28 VITALS — BP 138/88 | HR 83 | Ht 62.0 in | Wt 152.0 lb

## 2020-03-28 DIAGNOSIS — G8929 Other chronic pain: Secondary | ICD-10-CM | POA: Diagnosis not present

## 2020-03-28 DIAGNOSIS — M25512 Pain in left shoulder: Secondary | ICD-10-CM

## 2020-03-28 DIAGNOSIS — M75122 Complete rotator cuff tear or rupture of left shoulder, not specified as traumatic: Secondary | ICD-10-CM | POA: Diagnosis not present

## 2020-03-28 NOTE — Progress Notes (Signed)
[place] Phone: [phone] Subjective:   Lauren [name], am serving as a scribe for Dr. Hulan Saas. This visit occurred during the SARS-CoV-2 public health emergency.  Safety protocols were in place, including screening questions prior to the visit, additional usage of staff PPE, and extensive cleaning of exam room while observing appropriate contact time as indicated for disinfecting solutions.   I'm seeing this patient by the request  of:  Leone Haven, MD  CC: Left shoulder pain follow-up  [id number]  Lauren [name] is a 79 y.o. female coming in with complaint of left shoulder pain. Injected on 02/08/2020. Patient states that her pain has improved but still having pain over anterior aspect. Unable to do exercises due to limitations at her home. Is not longer hurting all the time.  Is making some progress.       Past Medical History:  Diagnosis Date  . Allergic rhinitis   . Allergy   . Cataract    removed by surgery  . 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 Chickenpox   . Elevated LDL cholesterol level 11/26/2015   [name] meds, diet controlled  . Gallstones    removed by surgery  . Glaucoma   . HTN (hypertension)   . Hx of adenomatous and sessile serrated colonic polyps 09/02/2018  . Incontinence of urine in female 10/05/2015  . Kidney stones    removed with gallbladder surgery per patient  . Migraines   . Urinary incontinence    Past Surgical History:  Procedure Laterality Date  . CHOLECYSTECTOMY  1995  . COLONOSCOPY  08/27/2018   TA polyps/ Gessner  . EYE SURGERY Bilateral    cataracts removed/laser  . SACRAL NERVE STIMULATOR PLACEMENT  04/2018   To control overactive bladder  . TONSILLECTOMY  1962  . TUBAL LIGATION     Social History   Socioeconomic History  . Marital status: Widowed    Spouse name: Not on file  . Number of children: 2  . Years of education: Not on file  . Highest  education level: Not on file  Occupational History  . Occupation: retired  Tobacco Use  . Smoking status: Former Smoker    Types: Cigarettes    Quit date: 08/06/1993    Years since quitting: 26.6  . Smokeless tobacco: Never Used  Vaping Use  . Vaping Use: Never used  Substance and Sexual Activity  . Alcohol use: [name]    Alcohol/week: 0.0 standard drinks  . Drug use: [name]  . Sexual activity: Not Currently    Birth control/protection: Post-menopausal  Other Topics Concern  . Not on file  Social History Narrative   Widowed   1 son and 1 daughter   She is a retired Radiation protection practitioner   Former smoker   [name] alcohol or caffeine or drugs or any tobacco   Social Determinants of Radio broadcast assistant Strain:   . Difficulty of Paying Living Expenses:   Food Insecurity:   . Worried About Charity fundraiser in the Last Year:   . Arboriculturist in the Last Year:   Transportation Needs:   . Film/video editor (Medical):   Marland Kitchen Lack of Transportation (Non-Medical):   Physical Activity:   . Days of Exercise per Week:   . Minutes of Exercise per Session:   Stress:   . Feeling of Stress :   Social Connections:   . Frequency of Communication  with Friends and Family:   . Frequency of Social Gatherings with Friends and Family:   . Attends Religious Services:   . Active Member of Clubs or Organizations:   . Attends Archivist Meetings:   Marland Kitchen Marital Status:    Allergies  Allergen Reactions  . Egg [Eggs Or Egg-Derived Products]   . Gabapentin Other (See Comments)    Unable to sleep  . Hydrochlorothiazide Other (See Comments)    Leg pain and aches  . Hydrocodone-Guaifenesin Other (See Comments)    Cant sleep   . Influenza Vaccines Other (See Comments)    Nausea and vomiting and stomach pain for 10 weeks  . Mobic [Meloxicam] Swelling  . Oxybutynin     Memory loss per patient  . Sulfa Antibiotics Other (See Comments)    Cold sweats, passing out   . Codeine Rash    Cold  sweats    Family History  Problem Relation Age of Onset  . Hematuria Father   . Heart disease Father   . Arthritis Father   . Heart disease Mother   . COPD Mother   . Atrial fibrillation Mother   . Hypertension Mother   . Colon cancer Daughter   . Rectal cancer Neg Hx   . Stomach cancer Neg Hx   . Esophageal cancer Neg Hx      Current Facility-Administered Medications (Endocrine & Metabolic):  .  betamethasone acetate-betamethasone sodium phosphate (CELESTONE) injection 3 mg  Current Outpatient Medications (Cardiovascular):  .  amLODipine (NORVASC) 5 MG tablet, Take 1 tablet (5 mg total) by mouth daily. 1 tablet every other day   Current Outpatient Medications (Respiratory):  .  fluticasone (FLONASE) 50 MCG/ACT nasal spray, SHAKE LIQUID AND USE 2 SPRAYS IN EACH NOSTRIL DAILY       Current Outpatient Medications (Other):  Marland Kitchen  Ascorbic Acid (VITAMIN C) 100 MG tablet, Take 100 mg by mouth daily. Marland Kitchen  latanoprost (XALATAN) 0.005 % ophthalmic solution, Place 1 drop into both eyes at bedtime.  .  NON FORMULARY, Vitamin B 12 Injections q month, Last injection 11/09/19    Reviewed prior external information including notes and imaging from  primary care provider As well as notes that were available from care everywhere and other healthcare systems.  Past medical history, social, surgical and family history all reviewed in electronic medical record.  [name] pertanent information unless stated regarding to the chief complaint.   Review of Systems:  [name] headache, visual changes, nausea, vomiting, diarrhea, constipation, dizziness, abdominal pain, skin rash, fevers, chills, night sweats, weight loss, swollen lymph nodes, body aches, joint swelling, chest pain, shortness of breath, mood changes. POSITIVE muscle aches  Objective  Blood pressure 138/88, pulse 83, height 5\' 2"  (1.575 m), weight 152 lb (68.9 kg), SpO2 99 %.   General: [name] apparent distress alert and oriented x3 mood and  affect normal, dressed appropriately.  HEENT: Pupils equal, extraocular movements intact  Respiratory: Patient's speak in full sentences and does not appear short of breath  Cardiovascular: [name] lower extremity edema, non tender, [name] erythema  Neuro: Cranial nerves II through XII are intact, neurovascularly intact in all extremities with 2+ DTRs and 2+ pulses.  Gait mild antalgic MSK: Left shoulder does show that patient does still have 4-5 strength.  Improvement in range of motion but still lacks the last 10 degrees of external rotation and has internal rotation almost near full now.  Limited musculoskeletal ultrasound was performed and interpreted by Lyndal Pulley  Limited ultrasound of patient's shoulder shows the patient's rotator cuff tear still present but some mild improvement noted.  Retraction now 0.5 cm.  Mild subacromial bursitis noted.    Impression and Recommendations:     The above documentation has been reviewed and is accurate and complete Lyndal Pulley, DO       Note: This dictation was prepared with Dragon dictation along with smaller phrase technology. Any transcriptional errors that result from this process are unintentional.

## 2020-03-28 NOTE — Patient Instructions (Signed)
Healing looks great Keep spelling ABCs See me in 2 months

## 2020-03-28 NOTE — Assessment & Plan Note (Signed)
Patient is making some improvement.  Has improvement in range of motion as well as some strength.  On ultrasound today still shows that there is a degree of retraction noted.

## 2020-03-29 ENCOUNTER — Ambulatory Visit: Payer: PPO

## 2020-04-03 ENCOUNTER — Telehealth: Payer: Self-pay | Admitting: Family Medicine

## 2020-04-03 NOTE — Telephone Encounter (Signed)
Pt needs tetanus shot and hep screening or shot? Please advise

## 2020-04-03 NOTE — Telephone Encounter (Signed)
Left message for patient to call office.  

## 2020-04-07 ENCOUNTER — Telehealth: Payer: Self-pay | Admitting: Family Medicine

## 2020-04-07 MED ORDER — AMLODIPINE BESYLATE 5 MG PO TABS: 5.0000 mg | ORAL_TABLET | Freq: Every day | ORAL | 2 refills | Status: DC

## 2020-04-07 NOTE — Telephone Encounter (Signed)
Pt called in need refill on amLODipine (NORVASC) 5 MG tablet

## 2020-04-10 ENCOUNTER — Telehealth: Payer: Self-pay

## 2020-04-10 NOTE — Telephone Encounter (Signed)
I got a call from [facility] to clarify amlodipine and I informed her the patient is to take 1 tablet every other dday.  Lashawna Poche,cma

## 2020-04-12 ENCOUNTER — Ambulatory Visit: Payer: PPO

## 2020-04-12 ENCOUNTER — Telehealth: Payer: Self-pay

## 2020-04-12 NOTE — Telephone Encounter (Signed)
Unsuccessful attempt to reach patient for awv. No answer. Left message to call the office back during allotted time or reschedule.

## 2020-05-03 ENCOUNTER — Ambulatory Visit (INDEPENDENT_AMBULATORY_CARE_PROVIDER_SITE_OTHER): Payer: PPO

## 2020-05-03 ENCOUNTER — Other Ambulatory Visit: Payer: Self-pay

## 2020-05-03 DIAGNOSIS — E538 Deficiency of other specified B group vitamins: Secondary | ICD-10-CM

## 2020-05-03 MED ORDER — CYANOCOBALAMIN 1000 MCG/ML IJ SOLN
1000.0000 ug | Freq: Once | INTRAMUSCULAR | Status: AC
  Administered 2020-05-03: 1000 ug via INTRAMUSCULAR

## 2020-05-03 NOTE — Progress Notes (Signed)
Patient presented for B 12 injection to right deltoid, patient voiced no concerns nor showed any signs of distress during injection. 

## 2020-05-17 ENCOUNTER — Telehealth: Payer: Self-pay | Admitting: Family Medicine

## 2020-05-17 NOTE — Telephone Encounter (Signed)
Patient Prolia approved can be scheduled on or after 06/14/20 please do not schedule before. Please schedule Nurse visit Prolia injection.

## 2020-05-19 ENCOUNTER — Ambulatory Visit (INDEPENDENT_AMBULATORY_CARE_PROVIDER_SITE_OTHER): Payer: PPO | Admitting: Family Medicine

## 2020-05-19 ENCOUNTER — Encounter: Payer: Self-pay | Admitting: Family Medicine

## 2020-05-19 ENCOUNTER — Other Ambulatory Visit: Payer: Self-pay

## 2020-05-19 VITALS — BP 152/83 | HR 79 | Temp 97.9°F | Ht 62.0 in | Wt 141.6 lb

## 2020-05-19 DIAGNOSIS — E538 Deficiency of other specified B group vitamins: Secondary | ICD-10-CM | POA: Diagnosis not present

## 2020-05-19 DIAGNOSIS — I10 Essential (primary) hypertension: Secondary | ICD-10-CM

## 2020-05-19 DIAGNOSIS — H6983 Other specified disorders of Eustachian tube, bilateral: Secondary | ICD-10-CM | POA: Diagnosis not present

## 2020-05-19 DIAGNOSIS — M810 Age-related osteoporosis without current pathological fracture: Secondary | ICD-10-CM

## 2020-05-19 DIAGNOSIS — Z23 Encounter for immunization: Secondary | ICD-10-CM

## 2020-05-19 DIAGNOSIS — Z1159 Encounter for screening for other viral diseases: Secondary | ICD-10-CM | POA: Diagnosis not present

## 2020-05-19 DIAGNOSIS — Z1231 Encounter for screening mammogram for malignant neoplasm of breast: Secondary | ICD-10-CM

## 2020-05-19 LAB — LIPID PANEL
Cholesterol: 200 mg/dL (ref 0–200)
HDL: 81.7 mg/dL (ref >39.00)
LDL Cholesterol: 102 mg/dL — ABNORMAL HIGH (ref 0–99)
NonHDL: 118.03
Total CHOL/HDL Ratio: 2
Triglycerides: 80 mg/dL (ref 0.0–149.0)
VLDL: 16 mg/dL (ref 0.0–40.0)

## 2020-05-19 MED ORDER — TETANUS-DIPHTH-ACELL PERTUSSIS 5-2.5-18.5 LF-MCG/0.5 IM SUSP: 0.5000 mL | Freq: Once | INTRAMUSCULAR | 0 refills | Status: AC

## 2020-05-19 NOTE — Assessment & Plan Note (Signed)
Seems to be above goal.  She will start checking it daily and send me a list of her readings in 1 week.  She will continue amlodipine 5 mg every other day.  Would consider adding on an additional medicine if above goal on home checks in 1 week.

## 2020-05-19 NOTE — Assessment & Plan Note (Signed)
She will be scheduled for her Prolia injection.

## 2020-05-19 NOTE — Assessment & Plan Note (Signed)
I suspect her ear fullness is related to eustachian tube dysfunction.  She will add on Claritin over-the-counter.  If not beneficial in the next 2 weeks to let us know and we can refer to audiology.

## 2020-05-19 NOTE — Patient Instructions (Signed)
Nice to see you. Please call to schedule your mammogram.  You should be able to call [phone] to get this set up. Please go to the pharmacy get your tetanus vaccine when you are ready. Please check your blood pressure daily for the next week and send me your readings. You can take Claritin over-the-counter to see if that helps with your ears.  If it does not over the next 2 weeks please let me know.

## 2020-05-19 NOTE — Assessment & Plan Note (Signed)
She will continue injectable B12 supplementation.

## 2020-05-19 NOTE — Progress Notes (Signed)
Tommi Rumps, MD Phone: [phone]  Lauren [name] is a 79 y.o. female who presents today for f/u.  HYPERTENSION  Disease Monitoring  Home BP Monitoring 621H systolic, only check every other day Chest pain- no    Dyspnea- no Medications  Compliance-  Taking amlodipine 5 mg qod.  Edema- no  B12 deficiency: Patient is doing well on injectable treatment.  No neuropathy symptoms.  No fatigue.  Ear fullness: Patient notes at times feels like there is water in her ears.  No tinnitus.  Osteoporosis: Due for Prolia after 06/14/2020.    Social History   Tobacco Use  Smoking Status Former Smoker  . Types: Cigarettes  . Quit date: 08/06/1993  . Years since quitting: 26.8  Smokeless Tobacco Never Used     ROS see history of present illness  Objective  Physical Exam Vitals:   05/19/20 1010  BP: (!) 152/83  Pulse: 79  Temp: 97.9 F (36.6 C)  SpO2: 99%    BP Readings from Last 3 Encounters:  05/19/20 (!) 152/83  03/28/20 138/88  02/08/20 (!) 144/62   Wt Readings from Last 3 Encounters:  05/19/20 141 lb 9.6 oz (64.2 kg)  03/28/20 152 lb (68.9 kg)  02/08/20 152 lb (68.9 kg)    Physical Exam Constitutional:      General: She is not in acute distress.    Appearance: She is not diaphoretic.  HENT:     Right Ear: Tympanic membrane and ear canal normal.     Left Ear: Tympanic membrane and ear canal normal.  Cardiovascular:     Rate and Rhythm: Normal rate and regular rhythm.     Heart sounds: Normal heart sounds.  Pulmonary:     Effort: Pulmonary effort is normal.     Breath sounds: Normal breath sounds.  Musculoskeletal:     Right lower leg: No edema.     Left lower leg: No edema.  Skin:    General: Skin is warm and dry.  Neurological:     Mental Status: She is alert.      Assessment/Plan: Please see individual problem list.  HTN (hypertension) Seems to be above goal.  She will start checking it daily and send me a list of her readings in 1 week.   She will continue amlodipine 5 mg every other day.  Would consider adding on an additional medicine if above goal on home checks in 1 week.  Eustachian tube dysfunction, bilateral I suspect her ear fullness is related to eustachian tube dysfunction.  She will add on Claritin over-the-counter.  If not beneficial in the next 2 weeks to let us know and we can refer to audiology.  Low serum vitamin B12 She will continue injectable B12 supplementation.  Osteoporosis She will be scheduled for her Prolia injection.   Health Maintenance: She will get her tetanus vaccine at the pharmacy.  She will call to schedule her mammogram.  Hepatitis C screening completed today.  Orders Placed This Encounter  Procedures  . MM 3D SCREEN BREAST BILATERAL    Standing Status:   Future    Standing Expiration Date:   05/19/2021    Order Specific Question:   Reason for Exam (SYMPTOM  OR DIAGNOSIS REQUIRED)    Answer:   Breast cancer screening    Order Specific Question:   Preferred imaging location?    Answer:   [facility]  . Flu Vaccine MDCK QUAD PF  . Hepatitis C Antibody  . Lipid panel  Meds ordered this encounter  Medications  . Tdap (BOOSTRIX) 5-2.5-18.5 LF-MCG/0.5 injection    Sig: Inject 0.5 mLs into the muscle once for 1 dose.    Dispense:  0.5 mL    Refill:  0    Lauren [name] was seen today for follow-up.  Diagnoses and all orders for this visit:  Hypertension, unspecified type -     Lipid panel  Encounter for immunization -     Flu Vaccine MDCK QUAD PF  Eustachian tube dysfunction, bilateral  Low serum vitamin B12  Age-related osteoporosis without current pathological fracture  Encounter for screening mammogram for malignant neoplasm of breast -     MM 3D SCREEN BREAST BILATERAL; Future  Need for hepatitis C screening test -     Hepatitis C Antibody  Other orders -     Tdap (BOOSTRIX) 5-2.5-18.5 LF-MCG/0.5 injection; Inject 0.5 mLs into the muscle once for 1  dose.     This visit occurred during the SARS-CoV-2 public health emergency.  Safety protocols were in place, including screening questions prior to the visit, additional usage of staff PPE, and extensive cleaning of exam room while observing appropriate contact time as indicated for disinfecting solutions.    Tommi Rumps, MD [place]

## 2020-05-22 LAB — HEPATITIS C ANTIBODY
Hepatitis C Ab: NONREACTIVE
SIGNAL TO CUT-OFF: 0 (ref <1.00)

## 2020-06-01 ENCOUNTER — Telehealth: Payer: Self-pay | Admitting: Family Medicine

## 2020-06-01 NOTE — Telephone Encounter (Signed)
Left message for patient to call back and schedule Medicare Annual Wellness Visit (AWV)   This should be a telephone visit only=30 minutes.  Last AWV 04/12/19; please schedule at anytime with Denisa O'Brien-Blaney at [place]. 

## 2020-06-02 ENCOUNTER — Telehealth: Payer: Self-pay

## 2020-06-02 NOTE — Telephone Encounter (Signed)
Pt called to give BP readings  10-4 156/79 10-5 144/78  10-6 145/80 10-7 138/72 10-8 143/72

## 2020-06-05 NOTE — Telephone Encounter (Signed)
Her blood pressure was slightly above goal.  I would suggest we add another blood pressure medication.  Would she be willing to go back on a low-dose of losartan?  If so we can start her on losartan 25 mg once daily and check a BMP in 7 to 10 days after starting that with a nurse visit at the same time for blood pressure check.  Thanks.

## 2020-06-05 NOTE — Telephone Encounter (Signed)
Left message to call office needs Prolia injection on or after 06/14/20 please schedule.

## 2020-06-05 NOTE — Telephone Encounter (Signed)
Pt called to give BP readings.  Gordana Kewley,cma    10-4 156/79 10-5 144/78  10-6 145/80 10-7 138/72 10-8 143/72

## 2020-06-07 ENCOUNTER — Ambulatory Visit (INDEPENDENT_AMBULATORY_CARE_PROVIDER_SITE_OTHER): Payer: PPO

## 2020-06-07 ENCOUNTER — Other Ambulatory Visit: Payer: Self-pay

## 2020-06-07 DIAGNOSIS — E538 Deficiency of other specified B group vitamins: Secondary | ICD-10-CM | POA: Diagnosis not present

## 2020-06-07 MED ORDER — CYANOCOBALAMIN 1000 MCG/ML IJ SOLN
1000.0000 ug | Freq: Once | INTRAMUSCULAR | Status: AC
  Administered 2020-06-07: 1000 ug via INTRAMUSCULAR

## 2020-06-07 NOTE — Telephone Encounter (Signed)
LVM for the patient informing her that I will call later. Anikah Hogge,cma

## 2020-06-07 NOTE — Progress Notes (Addendum)
Patient presented for B 12 injection to left deltoid, patient voiced no concerns nor showed any signs of distress during injection.  Reviewed.  Dr Scott 

## 2020-06-15 ENCOUNTER — Ambulatory Visit: Payer: PPO

## 2020-06-26 ENCOUNTER — Encounter: Payer: Self-pay | Admitting: Family Medicine

## 2020-06-26 ENCOUNTER — Other Ambulatory Visit: Payer: Self-pay

## 2020-06-26 ENCOUNTER — Telehealth: Payer: Self-pay

## 2020-06-26 ENCOUNTER — Ambulatory Visit: Payer: PPO | Admitting: Family Medicine

## 2020-06-26 ENCOUNTER — Ambulatory Visit: Payer: Self-pay

## 2020-06-26 VITALS — BP 140/70 | HR 92 | Ht 62.0 in | Wt 144.0 lb

## 2020-06-26 DIAGNOSIS — M79672 Pain in left foot: Secondary | ICD-10-CM

## 2020-06-26 DIAGNOSIS — M19079 Primary osteoarthritis, unspecified ankle and foot: Secondary | ICD-10-CM | POA: Diagnosis not present

## 2020-06-26 DIAGNOSIS — G5762 Lesion of plantar nerve, left lower limb: Secondary | ICD-10-CM | POA: Diagnosis not present

## 2020-06-26 NOTE — Patient Instructions (Addendum)
Good to see you Ice is your friend See me again in 3 months

## 2020-06-26 NOTE — Assessment & Plan Note (Signed)
Patient given injection and tolerated the procedure well, discussed icing regimen and home exercises.  Patient will continue to wear good shoes, we discussed over-the-counter orthotics.  Have tried different medications but concern for differential side effects.  Encouraged topical.  Follow-up with me again in 3 months

## 2020-06-26 NOTE — Progress Notes (Signed)
[facility] [address] [place] Phone: [phone] Subjective:   I Lauren [name] am serving as a Education administrator for Dr. Hulan Saas.  This visit occurred during the SARS-CoV-2 public health emergency.  Safety protocols were in place, including screening questions prior to the visit, additional usage of staff PPE, and extensive cleaning of exam room while observing appropriate contact time as indicated for disinfecting solutions.   I'm seeing this patient by the request  of:  Leone Haven, MD  CC: Foot pain follow-up  [id number]   03/28/2020 Patient is making some improvement.  Has improvement in range of motion as well as some strength.  On ultrasound today still shows that there is a degree of retraction noted.  Update 06/26/2020 Lauren [name] is a 79 y.o. female coming in with complaint of left shoulder pain. Patient states the shoulder is doing well. Has been doing home exercises that are helping. Left foot is ok. Callus on the foot from wearing Bronson.       Past Medical History:  Diagnosis Date  . Allergic rhinitis   . Allergy   . Cataract    removed by surgery  . 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 Chickenpox   . Elevated LDL cholesterol level 11/26/2015   no meds, diet controlled  . Gallstones    removed by surgery  . Glaucoma   . HTN (hypertension)   . Hx of adenomatous and sessile serrated colonic polyps 09/02/2018  . Incontinence of urine in female 10/05/2015  . Kidney stones    removed with gallbladder surgery per patient  . Migraines   . Urinary incontinence    Past Surgical History:  Procedure Laterality Date  . CHOLECYSTECTOMY  1995  . COLONOSCOPY  08/27/2018   TA polyps/ Gessner  . EYE SURGERY Bilateral    cataracts removed/laser  . SACRAL NERVE STIMULATOR PLACEMENT  04/2018   To control overactive bladder  . TONSILLECTOMY  1962  . TUBAL LIGATION     Social History   Socioeconomic History  .  Marital status: Widowed    Spouse name: Not on file  . Number of children: 2  . Years of education: Not on file  . Highest education level: Not on file  Occupational History  . Occupation: retired  Tobacco Use  . Smoking status: Former Smoker    Types: Cigarettes    Quit date: 08/06/1993    Years since quitting: 26.9  . Smokeless tobacco: Never Used  Vaping Use  . Vaping Use: Never used  Substance and Sexual Activity  . Alcohol use: No    Alcohol/week: 0.0 standard drinks  . Drug use: No  . Sexual activity: Not Currently    Birth control/protection: Post-menopausal  Other Topics Concern  . Not on file  Social History Narrative   Widowed   1 son and 1 daughter   She is a retired Radiation protection practitioner   Former smoker   No alcohol or caffeine or drugs or any tobacco   Social Determinants of Radio broadcast assistant Strain:   . Difficulty of Paying Living Expenses: Not on file  Food Insecurity:   . Worried About Charity fundraiser in the Last Year: Not on file  . Ran Out of Food in the Last Year: Not on file  Transportation Needs:   . Lack of Transportation (Medical): Not on file  . Lack of Transportation (Non-Medical): Not on file  Physical Activity:   .  Days of Exercise per Week: Not on file  . Minutes of Exercise per Session: Not on file  Stress:   . Feeling of Stress : Not on file  Social Connections:   . Frequency of Communication with Friends and Family: Not on file  . Frequency of Social Gatherings with Friends and Family: Not on file  . Attends Religious Services: Not on file  . Active Member of Clubs or Organizations: Not on file  . Attends Archivist Meetings: Not on file  . Marital Status: Not on file   Allergies  Allergen Reactions  . Egg [Eggs Or Egg-Derived Products]   . Gabapentin Other (See Comments)    Unable to sleep  . Hydrochlorothiazide Other (See Comments)    Leg pain and aches  . Hydrocodone-Guaifenesin Other (See Comments)    Cant  sleep   . Influenza Vaccines Other (See Comments)    Nausea and vomiting and stomach pain for 10 weeks  . Mobic [Meloxicam] Swelling  . Oxybutynin     Memory loss per patient  . Sulfa Antibiotics Other (See Comments)    Cold sweats, passing out   . Codeine Rash    Cold sweats    Family History  Problem Relation Age of Onset  . Hematuria Father   . Heart disease Father   . Arthritis Father   . Heart disease Mother   . COPD Mother   . Atrial fibrillation Mother   . Hypertension Mother   . Colon cancer Daughter   . Rectal cancer Neg Hx   . Stomach cancer Neg Hx   . Esophageal cancer Neg Hx      Current Facility-Administered Medications (Endocrine & Metabolic):  .  betamethasone acetate-betamethasone sodium phosphate (CELESTONE) injection 3 mg  Current Outpatient Medications (Cardiovascular):  .  amLODipine (NORVASC) 5 MG tablet, Take 1 tablet (5 mg total) by mouth daily. 1 tablet every other day   Current Outpatient Medications (Respiratory):  .  fluticasone (FLONASE) 50 MCG/ACT nasal spray, SHAKE LIQUID AND USE 2 SPRAYS IN EACH NOSTRIL DAILY       Current Outpatient Medications (Other):  Marland Kitchen  Ascorbic Acid (VITAMIN C) 100 MG tablet, Take 100 mg by mouth daily. Marland Kitchen  latanoprost (XALATAN) 0.005 % ophthalmic solution, Place 1 drop into both eyes at bedtime.  .  NON FORMULARY, Vitamin B 12 Injections q month, Last injection 11/09/19    Reviewed prior external information including notes and imaging from  primary care provider As well as notes that were available from care everywhere and other healthcare systems.  Past medical history, social, surgical and family history all reviewed in electronic medical record.  No pertanent information unless stated regarding to the chief complaint.   Review of Systems:  No headache, visual changes, nausea, vomiting, diarrhea, constipation, dizziness, abdominal pain, skin rash, fevers, chills, night sweats, weight loss, swollen lymph  nodes, body aches, joint swelling, chest pain, shortness of breath, mood changes. POSITIVE muscle aches  Objective  Blood pressure 140/70, pulse 92, height 5\' 2"  (1.575 m), weight 144 lb (65.3 kg), SpO2 92 %.   General: No apparent distress alert and oriented x3 mood and affect normal, dressed appropriately.  HEENT: Pupils equal, extraocular movements intact  Respiratory: Patient's speak in full sentences and does not appear short of breath  Cardiovascular: No lower extremity edema, non tender, no erythema  Mild antalgic gait Left foot shows the patient does have the breakdown of the transverse arch.  Bunion and bunionette formation  laterally noted.  Callus noted on the plantar aspect of the fifth metatarsal.  Patient also has pain between the first and second toes.  Seems to be more of the midfoot again as well.  Procedure: Real-time Ultrasound Guided Injection of left foot neuroma Device: GE Logiq Q7 Ultrasound guided injection is preferred based studies that show increased duration, increased effect, greater accuracy, decreased procedural pain, increased response rate, and decreased cost with ultrasound guided versus blind injection.  Verbal informed consent obtained.  Time-out conducted.  Noted no overlying erythema, induration, or other signs of local infection.  Skin prepped in a sterile fashion.  Local anesthesia: Topical Ethyl chloride.  With sterile technique and under real time ultrasound guidance: With a 25-gauge half inch needle injecting 0.5 cc of 0.5% Marcaine and 0.5 cc of Kenalog 40 mg/mL into an area that appears to have more of a neuroma but also has an area of mild midfoot arthritic changes. Completed without difficulty  Pain immediately resolved suggesting accurate placement of the medication.  Advised to call if fevers/chills, erythema, induration, drainage, or persistent bleeding.  Impression: Technically successful ultrasound guided injection.   Impression and  Recommendations:     The above documentation has been reviewed and is accurate and complete Lyndal Pulley, DO

## 2020-06-26 NOTE — Telephone Encounter (Signed)
Pt dropped off bp readings. Placed in folder up front for review

## 2020-06-29 ENCOUNTER — Telehealth: Payer: Self-pay

## 2020-06-29 NOTE — Telephone Encounter (Signed)
Pt got tdap on 06/09/20 at [facility]

## 2020-06-30 NOTE — Telephone Encounter (Signed)
Patient's mailbox is full.  Roverto Bodmer,cma

## 2020-06-30 NOTE — Telephone Encounter (Signed)
Her BP is elevated on several readings above 140/80. I would recommend adding another medication to help lower her BP. I can send one in once you speak with her. Thanks.

## 2020-07-03 NOTE — Telephone Encounter (Signed)
Patients mailbox is full I will send a mychart message.  Sricharan Lacomb,cma

## 2020-07-04 ENCOUNTER — Telehealth: Payer: Self-pay

## 2020-07-04 MED ORDER — FLUTICASONE PROPIONATE 50 MCG/ACT NA SUSP: NASAL | 2 refills | Status: DC

## 2020-07-04 NOTE — Telephone Encounter (Signed)
Pt needs a refill on fluticasone (FLONASE) 50 MCG/ACT nasal spray

## 2020-07-11 ENCOUNTER — Ambulatory Visit (INDEPENDENT_AMBULATORY_CARE_PROVIDER_SITE_OTHER): Payer: PPO

## 2020-07-11 ENCOUNTER — Other Ambulatory Visit: Payer: Self-pay

## 2020-07-11 DIAGNOSIS — E538 Deficiency of other specified B group vitamins: Secondary | ICD-10-CM | POA: Diagnosis not present

## 2020-07-11 MED ORDER — CYANOCOBALAMIN 1000 MCG/ML IJ SOLN
1000.0000 ug | Freq: Once | INTRAMUSCULAR | Status: AC
Start: 2020-07-11 — End: 2020-07-11
  Administered 2020-07-11: 1000 ug via INTRAMUSCULAR

## 2020-07-11 NOTE — Progress Notes (Signed)
Patient presented for B 12 injection to right deltoid, patient voiced no concerns nor showed any signs of distress during injection. 

## 2020-07-12 ENCOUNTER — Ambulatory Visit: Payer: PPO

## 2020-07-25 ENCOUNTER — Encounter: Payer: Self-pay | Admitting: Emergency Medicine

## 2020-07-25 ENCOUNTER — Ambulatory Visit
Admission: EM | Admit: 2020-07-25 | Discharge: 2020-07-25 | Disposition: A | Discharge status: Home / Self Care | Payer: PPO | Attending: Emergency Medicine | Admitting: Emergency Medicine

## 2020-07-25 ENCOUNTER — Other Ambulatory Visit: Payer: Self-pay

## 2020-07-25 DIAGNOSIS — R030 Elevated blood-pressure reading, without diagnosis of hypertension: Secondary | ICD-10-CM | POA: Diagnosis not present

## 2020-07-25 DIAGNOSIS — L0100 Impetigo, unspecified: Secondary | ICD-10-CM

## 2020-07-25 MED ORDER — MUPIROCIN CALCIUM 2 % EX CREA: 1.0000 "application " | TOPICAL_CREAM | Freq: Two times a day (BID) | CUTANEOUS | 0 refills | Status: DC

## 2020-07-25 NOTE — ED Triage Notes (Addendum)
Patient c/o rash on forehead x 4 days.   Patient stated onset of rash began upon waking.   Patient denies pruritus, pain, or discharge.   Patient has tried washing skin and washing made rash worst.

## 2020-07-25 NOTE — ED Provider Notes (Signed)
[facility]    CSN: [id number] Arrival date & time: 07/25/20  1057      History   Chief Complaint Chief Complaint  Patient presents with  . Rash    HPI Lauren [name] is a 79 y.o. female.   Patient presents with a rash on her forehead x4 days.  The rash is not pruritic or painful.  She has been using Benadryl cream with moderate relief but states the rash is spreading.  She denies fever, chills, sore throat, cough, shortness of breath, vomiting, diarrhea, or other symptoms.  Her medical history includes memory deficit, hypertension, allergies, migraines, glaucoma, osteoporosis.  The history is provided by the patient and medical records.    Past Medical History:  Diagnosis Date  . Allergic rhinitis   . Allergy   . Cataract    removed by surgery  . 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 Chickenpox   . Elevated LDL cholesterol level 11/26/2015   no meds, diet controlled  . Gallstones    removed by surgery  . Glaucoma   . HTN (hypertension)   . Hx of adenomatous and sessile serrated colonic polyps 09/02/2018  . Incontinence of urine in female 10/05/2015  . Kidney stones    removed with gallbladder surgery per patient  . Migraines   . Urinary incontinence     Patient Active Problem List   Diagnosis Date Noted  . Left rotator cuff tear 02/08/2020  . Left shoulder pain 01/19/2020  . Papule of skin 01/19/2020  . Glaucoma 01/19/2020  . Allergic rhinitis 11/15/2019  . Eustachian tube dysfunction, bilateral 05/22/2019  . Family history of colon cancer 05/22/2019  . Other bursal cyst, left ankle and foot 10/27/2018  . Hx of adenomatous and sessile serrated colonic polyps 09/02/2018  . Arthritis of midfoot 05/28/2018  . Status post cataract extraction 04/13/2018  . Overweight 03/04/2018  . Low serum vitamin B12 10/13/2017  . Morton's neuroma of third interspace of left foot 09/22/2017  . Loss of transverse plantar arch of left foot 09/22/2017  . Pain and swelling of  left lower leg 08/20/2017  . Memory deficit 07/02/2017  . HTN (hypertension) 07/02/2017  . Dry skin 08/31/2016  . History of shingles 08/31/2016  . Left foot pain 05/24/2016  . Osteoporosis 02/21/2016  . Elevated LDL cholesterol level 11/26/2015  . Incontinence of urine in female 10/05/2015    Past Surgical History:  Procedure Laterality Date  . CHOLECYSTECTOMY  1995  . COLONOSCOPY  08/27/2018   TA polyps/ Gessner  . EYE SURGERY Bilateral    cataracts removed/laser  . SACRAL NERVE STIMULATOR PLACEMENT  04/2018   To control overactive bladder  . TONSILLECTOMY  1962  . TUBAL LIGATION      OB History   No obstetric history on file.      Home Medications    Prior to Admission medications   Medication Sig Start Date End Date Taking? Authorizing Provider  amLODipine (NORVASC) 5 MG tablet Take 1 tablet (5 mg total) by mouth daily. 1 tablet every other day 04/07/20  Yes Leone Haven, MD  Ascorbic Acid (VITAMIN C) 100 MG tablet Take 100 mg by mouth daily.   Yes [provider]  latanoprost (XALATAN) 0.005 % ophthalmic solution Place 1 drop into both eyes at bedtime.  10/23/17  Yes [provider]  fluticasone (FLONASE) 50 MCG/ACT nasal spray SHAKE LIQUID AND USE 2 SPRAYS IN EACH NOSTRIL DAILY 07/04/20   Leone Haven, MD  mupirocin  cream (BACTROBAN) 2 % Apply 1 application topically 2 (two) times daily. 07/25/20   Sharion Balloon, NP  NON FORMULARY Vitamin B 12 Injections q month, Last injection 11/09/19    [provider]    Family History Family History  Problem Relation Age of Onset  . Hematuria Father   . Heart disease Father   . Arthritis Father   . Heart disease Mother   . COPD Mother   . Atrial fibrillation Mother   . Hypertension Mother   . Colon cancer Daughter   . Rectal cancer Neg Hx   . Stomach cancer Neg Hx   . Esophageal cancer Neg Hx     Social History Social History   Tobacco Use  . Smoking status: Former Smoker     Types: Cigarettes    Quit date: 08/06/1993    Years since quitting: 26.9  . Smokeless tobacco: Never Used  Vaping Use  . Vaping Use: Never used  Substance Use Topics  . Alcohol use: No    Alcohol/week: 0.0 standard drinks  . Drug use: No     Allergies   Egg [eggs or egg-derived products], Gabapentin, Hydrochlorothiazide, Hydrocodone-guaifenesin, Influenza vaccines, Mobic [meloxicam], Oxybutynin, Sulfa antibiotics, and Codeine   Review of Systems Review of Systems  Constitutional: Negative for chills and fever.  HENT: Negative for ear pain and sore throat.   Eyes: Negative for pain and visual disturbance.  Respiratory: Negative for cough and shortness of breath.   Cardiovascular: Negative for chest pain and palpitations.  Gastrointestinal: Negative for abdominal pain and vomiting.  Genitourinary: Negative for dysuria and hematuria.  Musculoskeletal: Negative for arthralgias and back pain.  Skin: Positive for rash. Negative for color change.  Neurological: Negative for seizures and syncope.  All other systems reviewed and are negative.    Physical Exam Triage Vital Signs ED Triage Vitals [07/25/20 1107]  Enc Vitals Group     BP      Pulse      Resp      Temp      Temp src      SpO2      Weight      Height      Head Circumference      Peak Flow      Pain Score 0     Pain Loc      Pain Edu?      Excl. in [place]?    No data found.  Updated Vital Signs BP (!) 155/83 (BP Location: Right Arm)   Pulse 86   Temp 98.3 F (36.8 C) (Oral)   Resp 18   LMP  (LMP Unknown)   SpO2 96%   Visual Acuity Right Eye Distance:   Left Eye Distance:   Bilateral Distance:    Right Eye Near:   Left Eye Near:    Bilateral Near:     Physical Exam Vitals and nursing note reviewed.  Constitutional:      General: She is not in acute distress.    Appearance: She is well-developed. She is not ill-appearing.  HENT:     Head: Normocephalic and atraumatic.     Right Ear: Tympanic  membrane normal.     Left Ear: Tympanic membrane normal.     Nose: Nose normal.     Mouth/Throat:     Mouth: Mucous membranes are moist.     Pharynx: Oropharynx is clear.  Eyes:     Conjunctiva/sclera: Conjunctivae normal.  Cardiovascular:  Rate and Rhythm: Normal rate and regular rhythm.     Heart sounds: Normal heart sounds.  Pulmonary:     Effort: Pulmonary effort is normal. No respiratory distress.     Breath sounds: Normal breath sounds.  Abdominal:     Palpations: Abdomen is soft.     Tenderness: There is no abdominal tenderness.  Musculoskeletal:     Cervical back: Neck supple.  Skin:    General: Skin is warm and dry.     Findings: Rash present.     Comments: Yellow crusted lesions on forehead.  Neurological:     General: No focal deficit present.     Mental Status: She is alert and oriented to person, place, and time.     Gait: Gait normal.  Psychiatric:        Mood and Affect: Mood normal.        Behavior: Behavior normal.      UC Treatments / Results  Labs (all labs ordered are listed, but only abnormal results are displayed) Labs Reviewed - No data to display  EKG   Radiology No results found.  Procedures Procedures (including critical care time)  Medications Ordered in UC Medications - No data to display  Initial Impression / Assessment and Plan / UC Course  I have reviewed the triage vital signs and the nursing notes.  Pertinent labs & imaging results that were available during my care of the patient were reviewed by me and considered in my medical decision making (see chart for details).   Impetigo.  Elevated blood pressure reading.  Treating with mupirocin cream.  Instructed patient to follow-up with her PCP if her symptoms are not improving.  Discussed that her blood pressure is elevated today and needs to be rechecked by her PCP in 2 to 4 weeks.  Patient agrees to plan of care.   Final Clinical Impressions(s) / UC Diagnoses   Final  diagnoses:  Impetigo  Elevated blood pressure reading     Discharge Instructions     Use the antibiotic cream as directed.  Follow-up with your primary care provider if your symptoms are not improving.  Your blood pressure is elevated today at 175/69.  Please have this rechecked by your primary care provider in 2-4 weeks.           ED Prescriptions    Medication Sig Dispense Auth. Provider   mupirocin cream (BACTROBAN) 2 % Apply 1 application topically 2 (two) times daily. 15 g Sharion Balloon, NP     PDMP not reviewed this encounter.   Sharion Balloon, NP 07/25/20 1156

## 2020-07-25 NOTE — Discharge Instructions (Addendum)
Use the antibiotic cream as directed.  Follow-up with your primary care provider if your symptoms are not improving.  Your blood pressure is elevated today at 175/69.  Please have this rechecked by your primary care provider in 2-4 weeks.

## 2020-08-07 ENCOUNTER — Telehealth: Payer: Self-pay | Admitting: Family Medicine

## 2020-08-07 NOTE — Telephone Encounter (Signed)
The patient's mailbox is full, I called to see when and where she received her vaccine.  Jabori Henegar,cma

## 2020-08-07 NOTE — Telephone Encounter (Signed)
Patient want to let Dr.Sonnenberg know she has had her booster and flu shot

## 2020-08-10 ENCOUNTER — Ambulatory Visit: Payer: PPO

## 2020-08-10 NOTE — Telephone Encounter (Signed)
Tried to call patient but no answer and VM is full.  

## 2020-08-11 ENCOUNTER — Telehealth: Payer: PPO | Admitting: Family Medicine

## 2020-08-15 NOTE — Telephone Encounter (Signed)
Message sent to patient in mychart.  Lauren [name],cma

## 2020-08-21 ENCOUNTER — Other Ambulatory Visit: Payer: Self-pay | Admitting: Family Medicine

## 2020-08-23 ENCOUNTER — Other Ambulatory Visit: Payer: Self-pay

## 2020-08-23 ENCOUNTER — Ambulatory Visit: Payer: PPO | Admitting: Family Medicine

## 2020-08-29 ENCOUNTER — Telehealth: Payer: Self-pay

## 2020-08-29 NOTE — Telephone Encounter (Signed)
LMTCB to discuss.

## 2020-08-29 NOTE — Telephone Encounter (Signed)
Spoke with patients daughter and she will not be with patient on Friday for her appt; states she did not know she even had an appt already. She is concerned about her moms confusion and patient can not remember anything even after saying something 5 mins before. Gloris Manchester is worried about possible early dementia or something. She states patient is in denial about this and does not think she has a problem with anything and gets upset and angry with the family when they mention anything to her. She wants to know what they should do from here and really wants the referral if possible to neurology.

## 2020-08-29 NOTE — Telephone Encounter (Signed)
Pt's daughter Gloris Manchester would like Dr. Birdie Sons to refer pt to a neurologist. She said that pt is having major confusion and can't remember when she talked to her. She said she gets the days mixed up and Gloris Manchester is concerned that she will get confused when she goes out. She said all the family has noticed this change in her. She also says pt is denying that any of this is going on. [name] would like a call back to discuss further. Also pt has appt this Friday 09/01/2020.

## 2020-08-30 NOTE — Telephone Encounter (Signed)
Noted. Will plan to discuss patient memory during visit.

## 2020-09-01 ENCOUNTER — Ambulatory Visit: Payer: PPO | Admitting: Family Medicine

## 2020-09-22 ENCOUNTER — Ambulatory Visit: Payer: Medicare HMO | Admitting: Family Medicine

## 2020-09-25 ENCOUNTER — Ambulatory Visit: Payer: Medicare HMO | Admitting: Family Medicine

## 2020-09-25 DIAGNOSIS — Z0289 Encounter for other administrative examinations: Secondary | ICD-10-CM

## 2020-09-26 ENCOUNTER — Ambulatory Visit: Payer: Self-pay

## 2020-09-26 ENCOUNTER — Other Ambulatory Visit: Payer: Self-pay

## 2020-09-26 ENCOUNTER — Encounter: Payer: Self-pay | Admitting: Family Medicine

## 2020-09-26 ENCOUNTER — Ambulatory Visit: Payer: PPO | Admitting: Family Medicine

## 2020-09-26 VITALS — BP 122/78 | HR 69 | Ht 62.0 in | Wt 139.0 lb

## 2020-09-26 DIAGNOSIS — M79672 Pain in left foot: Secondary | ICD-10-CM

## 2020-09-26 NOTE — Progress Notes (Signed)
Lauren Lauren [name] Phone: [phone] Subjective:   Lauren Lauren [name], am serving as a scribe for Dr. Hulan Saas. This visit occurred during the SARS-CoV-2 public health emergency.  Safety protocols were in place, including screening questions prior to the visit, additional usage of staff PPE, and extensive cleaning of exam room while observing appropriate contact time as indicated for disinfecting solutions.  I'm seeing this patient by the request  of:  Lauren Haven, MD  CC: Foot pain  [id number]   06/26/2020 Patient given injection and tolerated the procedure well, discussed icing regimen and home exercises.  Patient will continue to wear good shoes, we discussed over-the-counter orthotics.  Have tried different medications but concern for differential side effects.  Encouraged topical.  Follow-up with me again in 3 months  Update 09/26/2020 Lauren Lauren [name] is a 80 y.o. female coming in with complaint of left foot pain. Patient states that she is having pain over a calus that formed after wearing Birkenstocks. Lauren [name] pain over neuroma. Patient states that this is just been giving her more trouble recently.  Since the winter and different shoes starting to have increasing discomfort.  Patient denies any numbness, states it is all on the lateral aspect of the foot on the plantar aspect.      Past Medical History:  Diagnosis Date  . Allergic rhinitis   . Allergy   . Cataract    removed by surgery  . 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 Chickenpox   . Elevated LDL cholesterol level 11/26/2015   Lauren [name] meds, diet controlled  . Gallstones    removed by surgery  . Glaucoma   . HTN (hypertension)   . Hx of adenomatous and sessile serrated colonic polyps 09/02/2018  . Incontinence of urine in female 10/05/2015  . Kidney stones    removed with gallbladder surgery per patient  . Migraines   . Urinary incontinence    Past Surgical  History:  Procedure Laterality Date  . CHOLECYSTECTOMY  1995  . COLONOSCOPY  08/27/2018   TA polyps/ Gessner  . EYE SURGERY Bilateral    cataracts removed/laser  . SACRAL NERVE STIMULATOR PLACEMENT  04/2018   To control overactive bladder  . TONSILLECTOMY  1962  . TUBAL LIGATION     Social History   Socioeconomic History  . Marital status: Widowed    Spouse name: Not on file  . Number of children: 2  . Years of education: Not on file  . Highest education level: Not on file  Occupational History  . Occupation: retired  Tobacco Use  . Smoking status: Former Smoker    Types: Cigarettes    Quit date: 08/06/1993    Years since quitting: 27.1  . Smokeless tobacco: Never Used  Vaping Use  . Vaping Use: Never used  Substance and Sexual Activity  . Alcohol use: Lauren [name]    Alcohol/week: 0.0 standard drinks  . Drug use: Lauren [name]  . Sexual activity: Not Currently    Birth control/protection: Post-menopausal  Other Topics Concern  . Not on file  Social History Narrative   Widowed   1 son and 1 daughter   She is a retired Radiation protection practitioner   Former smoker   Lauren [name] alcohol or caffeine or drugs or any tobacco   Social Determinants of Radio broadcast assistant Strain: Not on file  Food Insecurity: Not on file  Transportation Needs: Not on file  Physical Activity: Not on file  Stress: Not on file  Social Connections: Not on file   Allergies  Allergen Reactions  . Egg [Eggs Or Egg-Derived Products]   . Gabapentin Other (See Comments)    Unable to sleep  . Hydrochlorothiazide Other (See Comments)    Leg pain and aches  . Hydrocodone-Guaifenesin Other (See Comments)    Cant sleep   . Influenza Vaccines Other (See Comments)    Nausea and vomiting and stomach pain for 10 weeks  . Mobic [Meloxicam] Swelling  . Oxybutynin     Memory loss per patient  . Sulfa Antibiotics Other (See Comments)    Cold sweats, passing out   . Codeine Rash    Cold sweats    Family History  Problem Relation  Age of Onset  . Hematuria Father   . Heart disease Father   . Arthritis Father   . Heart disease Mother   . COPD Mother   . Atrial fibrillation Mother   . Hypertension Mother   . Colon cancer Daughter   . Rectal cancer Neg Hx   . Stomach cancer Neg Hx   . Esophageal cancer Neg Hx      Current Facility-Administered Medications (Endocrine & Metabolic):  .  betamethasone acetate-betamethasone sodium phosphate (CELESTONE) injection 3 mg  Current Outpatient Medications (Cardiovascular):  .  amLODipine (NORVASC) 5 MG tablet, Take 1 tablet (5 mg total) by mouth daily. 1 tablet every other day   Current Outpatient Medications (Respiratory):  .  fluticasone (FLONASE) 50 MCG/ACT nasal spray, SHAKE LIQUID AND USE 2 SPRAYS IN EACH NOSTRIL DAILY       Current Outpatient Medications (Other):  Marland Kitchen  Ascorbic Acid (VITAMIN C) 100 MG tablet, Take 100 mg by mouth daily. Marland Kitchen  latanoprost (XALATAN) 0.005 % ophthalmic solution, Place 1 drop into both eyes at bedtime.  .  mupirocin cream (BACTROBAN) 2 %, Apply 1 application topically 2 (two) times daily. .  NON FORMULARY, Vitamin B 12 Injections q month, Last injection 11/09/19    Reviewed prior external information including notes and imaging from  primary care provider As well as notes that were available from care everywhere and other healthcare systems.  Past medical history, social, surgical and family history all reviewed in electronic medical record.  Lauren [name] pertanent information unless stated regarding to the chief complaint.   Review of Systems:  Lauren [name] headache, visual changes, nausea, vomiting, diarrhea, constipation, dizziness, abdominal pain, skin rash, fevers, chills, night sweats, weight loss, swollen lymph nodes, body aches, joint swelling, chest pain, shortness of breath, mood changes. POSITIVE muscle aches  Objective  Blood pressure 122/78, pulse 69, height 5\' 2"  (1.575 m), weight 139 lb (63 kg), SpO2 96 %.   General: Lauren [name] apparent  distress alert and oriented x3 mood and affect normal, dressed appropriately.  HEENT: Pupils equal, extraocular movements intact  Respiratory: Patient's speak in full sentences and does not appear short of breath  Cardiovascular: Lauren [name] lower extremity edema, non tender, Lauren [name] erythema  Gait normal with good balance and coordination.  MSK: Left foot exam shows breakdown of the transverse arch noted.  Patient does have a large callus on the plantar aspect over the distal portion of the fifth metatarsal.  Minorly tender to palpation.  Lauren [name] erythema noted.  Neurovascularly intact distally.  Negative squeeze test.  After verbal consent patient was prepped with an alcohol swab and then with a 10-blade patient did have debridement of these areas did skin and callus today then liquid nitrogen placed.  Lauren [name] blood loss.  Post procedure care verbalized to patient.    Impression and Recommendations:     The above documentation has been reviewed and is accurate and complete Lyndal Pulley, DO

## 2020-09-26 NOTE — Assessment & Plan Note (Signed)
Patient had a callus noted over the head of the fifth metatarsal.  Patient's did have very dry skin broken down today and then had liquid nitrogen placed.  Patient tolerated the procedure well.  Discussed doing a over-the-counter wart remover cream for daily for 1 week and then falling down the area, discussed using lotion regularly.  Follow-up with me again in 6 weeks in case it seems to return.

## 2020-09-26 NOTE — Patient Instructions (Signed)
Took care of callus today Wart removal cream with bandaid for a week File down area to new skin Euercerine or aveno lotion 1-2 times a day to the feet See me again in 6 weeks just in case

## 2020-09-27 ENCOUNTER — Other Ambulatory Visit: Payer: Self-pay

## 2020-09-27 ENCOUNTER — Encounter: Payer: Self-pay | Admitting: Family Medicine

## 2020-09-27 ENCOUNTER — Ambulatory Visit (INDEPENDENT_AMBULATORY_CARE_PROVIDER_SITE_OTHER): Payer: PPO | Admitting: Family Medicine

## 2020-09-27 VITALS — BP 130/80 | HR 90 | Temp 98.4°F | Ht 62.0 in | Wt 140.0 lb

## 2020-09-27 DIAGNOSIS — R413 Other amnesia: Secondary | ICD-10-CM | POA: Diagnosis not present

## 2020-09-27 DIAGNOSIS — I10 Essential (primary) hypertension: Secondary | ICD-10-CM

## 2020-09-27 NOTE — Progress Notes (Signed)
Tommi Rumps, MD Phone: [phone]  Lauren [name] is a 80 y.o. female who presents today for follow-up.  Hypertension: Generally well controlled.  Taking amlodipine.  No chest pain, shortness of breath, or edema.  Memory difficulty/hearing issues: The patient reports that her daughter is concerned about her memory.  She notes that if she does not remember something exactly how the daughter thinks she needs to she questions whether or not she has memory difficulty.  The patient thinks it is more of a hearing issue.  She was evaluated for hearing loss by audiology and was recommended to have hearing aids bilaterally.  She is working on getting those fit and notes when she had the loaner pair she did quite a bit better.  MMSE - Mini Mental State Exam 09/27/2020 07/02/2017 11/25/2016  Orientation to time '5 5 5  ' Orientation to Place '5 5 5  ' Registration '3 3 3  ' Attention/ Calculation '3 3 5  ' Recall '1 3 3  ' Language- name 2 objects '2 2 2  ' Language- repeat '1 1 1  ' Language- follow 3 step command '2 3 3  ' Language- read & follow direction '1 1 1  ' Write a sentence '1 1 1  ' Copy design '1 1 1  ' Total score '25 28 30      ' Social History   Tobacco Use  Smoking Status Former Smoker  . Types: Cigarettes  . Quit date: 08/06/1993  . Years since quitting: 27.1  Smokeless Tobacco Never Used    Current Outpatient Medications on File Prior to Visit  Medication Sig Dispense Refill  . amLODipine (NORVASC) 5 MG tablet Take 1 tablet (5 mg total) by mouth daily. 1 tablet every other day 90 tablet 2  . Ascorbic Acid (VITAMIN C) 100 MG tablet Take 100 mg by mouth daily.    . fluticasone (FLONASE) 50 MCG/ACT nasal spray SHAKE LIQUID AND USE 2 SPRAYS IN EACH NOSTRIL DAILY 48 g 0  . latanoprost (XALATAN) 0.005 % ophthalmic solution Place 1 drop into both eyes at bedtime.   1  . mupirocin cream (BACTROBAN) 2 % Apply 1 application topically 2 (two) times daily. 15 g 0  . NON FORMULARY Vitamin B 12 Injections q  month, Last injection 11/09/19     Current Facility-Administered Medications on File Prior to Visit  Medication Dose Route Frequency Provider Last Rate Last Admin  . betamethasone acetate-betamethasone sodium phosphate (CELESTONE) injection 3 mg  3 mg Intramuscular Once Edrick Kins, DPM         ROS see history of present illness  Objective  Physical Exam Vitals:   09/27/20 1351  BP: 130/80  Pulse: 90  Temp: 98.4 F (36.9 C)  SpO2: 96%    BP Readings from Last 3 Encounters:  09/27/20 130/80  09/26/20 122/78  07/25/20 (!) 155/83   Wt Readings from Last 3 Encounters:  09/27/20 140 lb (63.5 kg)  09/26/20 139 lb (63 kg)  06/26/20 144 lb (65.3 kg)    Physical Exam Constitutional:      General: She is not in acute distress.    Appearance: She is not diaphoretic.  Cardiovascular:     Rate and Rhythm: Normal rate and regular rhythm.     Heart sounds: Normal heart sounds.  Pulmonary:     Effort: Pulmonary effort is normal.     Breath sounds: Normal breath sounds.  Musculoskeletal:        General: No edema.     Right lower leg: No edema.  Left lower leg: No edema.  Skin:    General: Skin is warm and dry.  Neurological:     Mental Status: She is alert.      Assessment/Plan: Please see individual problem list.  Problem List Items Addressed This Visit    HTN (hypertension) - Primary    Adequately controlled.  Continue amlodipine 5 mg every other day.  Check CMP.      Relevant Orders   Comp Met (CMET)   Memory deficit    I suspect this is more related to hearing issues.  Her MMSE is acceptable.  She will get fit for hearing aids and see how she does.          This visit occurred during the SARS-CoV-2 public health emergency.  Safety protocols were in place, including screening questions prior to the visit, additional usage of staff PPE, and extensive cleaning of exam room while observing appropriate contact time as indicated for disinfecting solutions.     Tommi Rumps, MD [place]

## 2020-09-27 NOTE — Assessment & Plan Note (Signed)
Adequately controlled.  Continue amlodipine 5 mg every other day.  Check CMP.

## 2020-09-27 NOTE — Patient Instructions (Signed)
Nice to see you. Please get hearing aids. We will contact you with your lab results.

## 2020-09-27 NOTE — Assessment & Plan Note (Signed)
I suspect this is more related to hearing issues.  Her MMSE is acceptable.  She will get fit for hearing aids and see how she does.

## 2020-09-28 LAB — COMPREHENSIVE METABOLIC PANEL
ALT: 11 U/L (ref 0–35)
AST: 18 U/L (ref 0–37)
Albumin: 4.3 g/dL (ref 3.5–5.2)
Alkaline Phosphatase: 49 U/L (ref 39–117)
BUN: 26 mg/dL — ABNORMAL HIGH (ref 6–23)
CO2: 31 mEq/L (ref 19–32)
Calcium: 9.8 mg/dL (ref 8.4–10.5)
Chloride: 105 mEq/L (ref 96–112)
Creatinine, Ser: 1.02 mg/dL (ref 0.40–1.20)
GFR: 52.95 mL/min — ABNORMAL LOW (ref >60.00)
Glucose, Bld: 84 mg/dL (ref 70–99)
Potassium: 3.6 mEq/L (ref 3.5–5.1)
Sodium: 142 mEq/L (ref 135–145)
Total Bilirubin: 0.5 mg/dL (ref 0.2–1.2)
Total Protein: 6.7 g/dL (ref 6.0–8.3)

## 2020-10-02 DIAGNOSIS — H903 Sensorineural hearing loss, bilateral: Secondary | ICD-10-CM | POA: Diagnosis not present

## 2020-10-05 DIAGNOSIS — H903 Sensorineural hearing loss, bilateral: Secondary | ICD-10-CM | POA: Diagnosis not present

## 2020-10-09 ENCOUNTER — Telehealth: Payer: Self-pay | Admitting: Family Medicine

## 2020-10-09 NOTE — Telephone Encounter (Signed)
Left message for patient to call back and schedule Medicare Annual Wellness Visit (AWV)   This should be a telephone visit only=30 minutes.  Last AWV 04/12/19; please schedule at anytime with Denisa O'Brien-Blaney at [facility].

## 2020-10-23 ENCOUNTER — Telehealth: Payer: Self-pay

## 2020-10-23 ENCOUNTER — Other Ambulatory Visit: Payer: Self-pay | Admitting: Family Medicine

## 2020-10-23 DIAGNOSIS — N179 Acute kidney failure, unspecified: Secondary | ICD-10-CM

## 2020-10-23 NOTE — Telephone Encounter (Signed)
-----   Message from Leone Haven, MD sent at 10/23/2020  9:29 AM EST ----- Noted. Can we recheck a BMET in 2-3 weeks? Orders placed.

## 2020-10-24 ENCOUNTER — Encounter: Payer: Self-pay | Admitting: Internal Medicine

## 2020-10-24 ENCOUNTER — Ambulatory Visit (INDEPENDENT_AMBULATORY_CARE_PROVIDER_SITE_OTHER): Payer: PPO | Admitting: Internal Medicine

## 2020-10-24 ENCOUNTER — Other Ambulatory Visit: Payer: Self-pay

## 2020-10-24 ENCOUNTER — Encounter: Payer: Self-pay | Admitting: Family Medicine

## 2020-10-24 VITALS — BP 144/82 | HR 85 | Temp 98.2°F | Ht 62.0 in | Wt 141.0 lb

## 2020-10-24 DIAGNOSIS — R413 Other amnesia: Secondary | ICD-10-CM

## 2020-10-24 DIAGNOSIS — E559 Vitamin D deficiency, unspecified: Secondary | ICD-10-CM | POA: Diagnosis not present

## 2020-10-24 DIAGNOSIS — E538 Deficiency of other specified B group vitamins: Secondary | ICD-10-CM | POA: Diagnosis not present

## 2020-10-24 DIAGNOSIS — Z1329 Encounter for screening for other suspected endocrine disorder: Secondary | ICD-10-CM

## 2020-10-24 DIAGNOSIS — R7989 Other specified abnormal findings of blood chemistry: Secondary | ICD-10-CM | POA: Diagnosis not present

## 2020-10-24 DIAGNOSIS — R410 Disorientation, unspecified: Secondary | ICD-10-CM | POA: Diagnosis not present
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
## 2020-10-24 NOTE — Patient Instructions (Addendum)
Neu[facility] [place] [phone]    Memory Compensation Strategies  1. Use "WARM" strategy.  W= write it down  A= associate it  R= repeat it  M= make a mental note  2.   You can keep a Social worker.  Use a 3-ring notebook with sections for the following: calendar, important names and phone numbers,  medications, doctors' names/phone numbers, lists/reminders, and a section to journal what you did  each day.   3.    Use a calendar to write appointments down.  4.    Write yourself a schedule for the day.  This can be placed on the calendar or in a separate section of the Memory Notebook.  Keeping a  regular schedule can help memory.  5.    Use medication organizer with sections for each day or morning/evening pills.  You may need help loading it  6.    Keep a basket, or pegboard by the door.  Place items that you need to take out with you in the basket or on the pegboard.  You may also want to  include a message board for reminders.  7.    Use sticky notes.  Place sticky notes with reminders in a place where the task is performed.  For example: " turn off the  stove" placed by the stove, "lock the door" placed on the door at eye level, " take your medications" on  the bathroom mirror or by the place where you normally take your medications.  8.    Use alarms/timers.  Use while cooking to remind yourself to check on food or as a reminder to take your medicine, or as a  reminder to make a call, or as a reminder to perform another task, etc.

## 2020-10-25 ENCOUNTER — Other Ambulatory Visit: Payer: PPO

## 2020-10-25 ENCOUNTER — Telehealth: Payer: Self-pay | Admitting: Family Medicine

## 2020-10-25 DIAGNOSIS — R410 Disorientation, unspecified: Secondary | ICD-10-CM | POA: Diagnosis not present

## 2020-10-25 DIAGNOSIS — R413 Other amnesia: Secondary | ICD-10-CM

## 2020-10-25 LAB — CBC WITH DIFFERENTIAL/PLATELET
Basophils Absolute: 0.1 10*3/uL (ref 0.0–0.1)
Basophils Relative: 1.2 % (ref 0.0–3.0)
Eosinophils Absolute: 0.1 10*3/uL (ref 0.0–0.7)
Eosinophils Relative: 1.5 % (ref 0.0–5.0)
HCT: 39.1 % (ref 36.0–46.0)
Hemoglobin: 13.4 g/dL (ref 12.0–15.0)
Lymphocytes Relative: 30.4 % (ref 12.0–46.0)
Lymphs Abs: 1.3 10*3/uL (ref 0.7–4.0)
MCHC: 34.3 g/dL (ref 30.0–36.0)
MCV: 87.2 fl (ref 78.0–100.0)
Monocytes Absolute: 0.2 10*3/uL (ref 0.1–1.0)
Monocytes Relative: 5.2 % (ref 3.0–12.0)
Neutro Abs: 2.7 10*3/uL (ref 1.4–7.7)
Neutrophils Relative %: 61.7 % (ref 43.0–77.0)
Platelets: 247 10*3/uL (ref 150.0–400.0)
RBC: 4.48 Mil/uL (ref 3.87–5.11)
RDW: 12.9 % (ref 11.5–15.5)
WBC: 4.4 10*3/uL (ref 4.0–10.5)

## 2020-10-25 LAB — BASIC METABOLIC PANEL
BUN: 20 mg/dL (ref 6–23)
CO2: 29 mEq/L (ref 19–32)
Calcium: 9.5 mg/dL (ref 8.4–10.5)
Chloride: 102 mEq/L (ref 96–112)
Creatinine, Ser: 0.92 mg/dL (ref 0.40–1.20)
GFR: 59.9 mL/min — ABNORMAL LOW (ref >60.00)
Glucose, Bld: 92 mg/dL (ref 70–99)
Potassium: 3.6 mEq/L (ref 3.5–5.1)
Sodium: 141 mEq/L (ref 135–145)

## 2020-10-25 NOTE — Telephone Encounter (Signed)
Pt saw Dr.Tracy yesterday and wanted to know if it was ok for her to take prevagen Pt would like you to call her back only

## 2020-10-25 NOTE — Telephone Encounter (Signed)
Please advise 

## 2020-10-25 NOTE — Telephone Encounter (Signed)
It may or may  not help there are more advance prescriptions medications neurology can recommend  This prevagen is otc if they want to try

## 2020-10-26 LAB — VITAMIN D 25 HYDROXY (VIT D DEFICIENCY, FRACTURES): VITD: 28.4 ng/mL — ABNORMAL LOW (ref 30.00–100.00)

## 2020-10-26 LAB — TSH: TSH: 0.9 u[IU]/mL (ref 0.35–4.50)

## 2020-10-26 LAB — VITAMIN B12: Vitamin B-12: 284 pg/mL (ref 211–911)

## 2020-10-26 NOTE — Telephone Encounter (Signed)
No answer, no voicemail.

## 2020-10-27 ENCOUNTER — Other Ambulatory Visit: Payer: Self-pay | Admitting: Internal Medicine

## 2020-10-27 ENCOUNTER — Ambulatory Visit: Payer: PPO | Admitting: Family Medicine

## 2020-10-27 DIAGNOSIS — N3000 Acute cystitis without hematuria: Secondary | ICD-10-CM

## 2020-10-27 MED ORDER — CIPROFLOXACIN HCL 500 MG PO TABS: 500.0000 mg | ORAL_TABLET | Freq: Two times a day (BID) | ORAL | 0 refills | Status: DC

## 2020-10-28 LAB — URINALYSIS, ROUTINE W REFLEX MICROSCOPIC
Bilirubin, UA: NEGATIVE
Glucose, UA: NEGATIVE
Nitrite, UA: NEGATIVE
Protein,UA: NEGATIVE
RBC, UA: NEGATIVE
Specific Gravity, UA: 1.017 (ref 1.005–1.030)
Urobilinogen, Ur: 0.2 mg/dL (ref 0.2–1.0)
pH, UA: 5 (ref 5.0–7.5)

## 2020-10-28 LAB — MICROSCOPIC EXAMINATION
Casts: NONE SEEN /lpf
Epithelial Cells (non renal): >10 /hpf — AB (ref 0–10)
RBC, Urine: NONE SEEN /hpf (ref 0–2)

## 2020-10-28 LAB — URINE CULTURE

## 2020-10-28 LAB — SPECIMEN STATUS REPORT

## 2020-10-30 ENCOUNTER — Encounter: Payer: Self-pay | Admitting: Neurology

## 2020-10-30 ENCOUNTER — Telehealth: Payer: Self-pay | Admitting: Neurology

## 2020-10-30 ENCOUNTER — Ambulatory Visit: Payer: PPO | Admitting: Neurology

## 2020-10-30 VITALS — BP 170/84 | HR 85 | Ht 61.0 in | Wt 140.5 lb

## 2020-10-30 DIAGNOSIS — F418 Other specified anxiety disorders: Secondary | ICD-10-CM | POA: Diagnosis not present

## 2020-10-30 DIAGNOSIS — F0280 Dementia in other diseases classified elsewhere without behavioral disturbance: Secondary | ICD-10-CM

## 2020-10-30 DIAGNOSIS — G309 Alzheimer's disease, unspecified: Secondary | ICD-10-CM | POA: Diagnosis not present

## 2020-10-30 MED ORDER — ESCITALOPRAM OXALATE 5 MG PO TABS: 5.0000 mg | ORAL_TABLET | Freq: Every day | ORAL | 3 refills | Status: DC

## 2020-10-30 MED ORDER — DONEPEZIL HCL 5 MG PO TABS: 5.0000 mg | ORAL_TABLET | Freq: Every day | ORAL | 1 refills | Status: DC

## 2020-10-30 NOTE — Progress Notes (Signed)
[facility]  PATIENT: Lauren [name] DOB: 1940/09/09  REFERRING DOCTOR OR PCP:  [name] SOURCE: Patient, notes from primary care, lab results  _________________________________   HISTORICAL  CHIEF COMPLAINT:  Chief Complaint  Patient presents with   New Patient (Initial Visit)    RM 41, with daughter, Tressia Miners. Internal referral for memory loss. MOCA today: 19/30. Education: 13 yr    HISTORY OF PRESENT ILLNESS:  I had the pleasure of seeing your patient, Lauren [name], at [facility] for neurologic consultation regarding her memory loss.  She is a 80 year old woman who has been noted to have cognitive decline over the last 2 years.  Ms. Dorrance is noting some trouble with her memoy that she feels are mild though her daughter notes progressive worsening over a few years.   She notes she misplaces items.    She is still driving.   She had one episode where she got lost driving.    She used [employer] which helped her get home.   She continues to do household ADLs like cooking but has more trouble with recipes.  Her daughter notices she has trouble with dates and forgets appointments.   She has trouble with names.     She has forgotten to turn the oven off and lock doors.  She is not sleeping as well and notes bedtime anxiety.   She has had som depression and anxiety.      Montreal Cognitive Assessment  10/30/2020  Visuospatial/ Executive (0/5) 2  Naming (0/3) 3  Attention: Read list of digits (0/2) 2  Attention: Read list of letters (0/1) 0  Attention: Serial 7 subtraction starting at 100 (0/3) 2  Language: Repeat phrase (0/2) 2  Language : Fluency (0/1) 0  Abstraction (0/2) 2  Delayed Recall (0/5) 0  Orientation (0/6) 6  Total 19  Adjusted Score (based on education) 19   She is healthy and is just on BP medication.    She is active during the day and does some simple yardwork.     TSH and B12 were normal  REVIEW OF  SYSTEMS: Constitutional: No fevers, chills, sweats, or change in appetite Eyes: No visual changes, double vision, eye pain Ear, nose and throat: No hearing loss, ear pain, nasal congestion, sore throat Cardiovascular: No chest pain, palpitations Respiratory: No shortness of breath at rest or with exertion.   No wheezes GastrointestinaI: No nausea, vomiting, diarrhea, abdominal pain, fecal incontinence Genitourinary: No dysuria, urinary retention or frequency.  No nocturia. Musculoskeletal: No neck pain, back pain Integumentary: No rash, pruritus, skin lesions Neurological: as above Psychiatric: Some depression and anxiety Endocrine: No palpitations, diaphoresis, change in appetite, change in weigh or increased thirst Hematologic/Lymphatic: No anemia, purpura, petechiae. Allergic/Immunologic: No itchy/runny eyes, nasal congestion, recent allergic reactions, rashes  ALLERGIES: Allergies  Allergen Reactions   Gabapentin Other (See Comments)    Unable to sleep   Hydrochlorothiazide Other (See Comments)    Leg pain and aches   Hydrocodone-Guaifenesin Other (See Comments)    Cant sleep    Influenza Vaccines Other (See Comments)    Nausea and vomiting and stomach pain for 10 weeks   Mobic [Meloxicam] Swelling   Oxybutynin     Memory loss per patient   Sulfa Antibiotics Other (See Comments)    Cold sweats, passing out    Codeine Rash    Cold sweats     HOME MEDICATIONS:  Current Outpatient Medications:    amLODipine (NORVASC) 5 MG  tablet, Take 1 tablet (5 mg total) by mouth daily. 1 tablet every other day, Disp: 90 tablet, Rfl: 2   Ascorbic Acid (VITAMIN C) 100 MG tablet, Take 100 mg by mouth daily., Disp: , Rfl:    donepezil (ARICEPT) 5 MG tablet, Take 1 tablet (5 mg total) by mouth at bedtime., Disp: 90 tablet, Rfl: 1   escitalopram (LEXAPRO) 5 MG tablet, Take 1 tablet (5 mg total) by mouth daily., Disp: 90 tablet, Rfl: 3   fluticasone (FLONASE) 50 MCG/ACT nasal  spray, SHAKE LIQUID AND USE 2 SPRAYS IN EACH NOSTRIL DAILY, Disp: 48 g, Rfl: 0   latanoprost (XALATAN) 0.005 % ophthalmic solution, Place 1 drop into both eyes at bedtime. , Disp: , Rfl: 1   NON FORMULARY, Vitamin B 12 Injections q month, Last injection 11/09/19, Disp: , Rfl:   Current Facility-Administered Medications:    betamethasone acetate-betamethasone sodium phosphate (CELESTONE) injection 3 mg, 3 mg, Intramuscular, Once, Edrick Kins, DPM  PAST MEDICAL HISTORY: Past Medical History:  Diagnosis Date   Allergic rhinitis    Allergy    Cataract    removed by surgery   Cecal angiodysplasia 11/2019   Chickenpox    Elevated LDL cholesterol level 11/26/2015   no meds, diet controlled   Gallstones    removed by surgery   Glaucoma    HTN (hypertension)    Hx of adenomatous and sessile serrated colonic polyps 09/02/2018   Incontinence of urine in female 10/05/2015   Kidney stones    removed with gallbladder surgery per patient   Migraines    Urinary incontinence     PAST SURGICAL HISTORY: Past Surgical History:  Procedure Laterality Date   CHOLECYSTECTOMY  1995   COLONOSCOPY  08/27/2018   TA polyps/ Gessner   EYE SURGERY Bilateral    cataracts removed/laser   SACRAL NERVE STIMULATOR PLACEMENT  04/2018   To control overactive bladder   TONSILLECTOMY  1962   TUBAL LIGATION      FAMILY HISTORY: Family History  Problem Relation Age of Onset   Hematuria Father    Heart disease Father    Arthritis Father    Heart disease Mother    COPD Mother    Atrial fibrillation Mother    Hypertension Mother    Colon cancer Daughter    Rectal cancer Neg Hx    Stomach cancer Neg Hx    Esophageal cancer Neg Hx     SOCIAL HISTORY:  Social History   Socioeconomic History   Marital status: Widowed    Spouse name: Not on file   Number of children: 2   Years of education: Not on file   Highest education level: Not on file  Occupational  History   Occupation: retired  Tobacco Use   Smoking status: Former Smoker    Types: Cigarettes    Quit date: 08/06/1993    Years since quitting: 27.2   Smokeless tobacco: Never Used  Vaping Use   Vaping Use: Never used  Substance and Sexual Activity   Alcohol use: No    Alcohol/week: 0.0 standard drinks   Drug use: No   Sexual activity: Not Currently    Birth control/protection: Post-menopausal  Other Topics Concern   Not on file  Social History Narrative   Right handed   Widowed   1 son and 1 daughter   She is a retired Radiation protection practitioner   Former smoker   No alcohol or caffeine or drugs or any tobacco   Social  Determinants of Health   Financial Resource Strain: Not on file  Food Insecurity: Not on file  Transportation Needs: Not on file  Physical Activity: Not on file  Stress: Not on file  Social Connections: Not on file  Intimate Partner Violence: Not on file     PHYSICAL EXAM  Vitals:   10/30/20 0800  BP: (!) 170/84  Pulse: 85  Weight: 140 lb 8 oz (63.7 kg)  Height: 5\' 1"  (1.549 m)    Body mass index is 26.55 kg/m.   General: The patient is well-developed and well-nourished and in no acute distress  HEENT:  Head is Tony/AT.  Sclera are anicteric.    Neck: No carotid bruits are noted.  The neck is nontender.  Cardiovascular: The heart has a regular rate and rhythm with a normal S1 and S2. There were no murmurs, gallops or rubs.    Skin: Extremities are without rash or  edema.  Musculoskeletal:  Back is nontender  Neurologic Exam  Mental status: The patient is alert and oriented x 3 at the time of the examination.  She has markedly reduced short-term memory and some reduction of focus and attention.  Her score was 19/30 on the [facility] cognitive assessment (details above) speech is normal.  Cranial nerves: Extraocular movements are full. Pupils are equal, round, and reactive to light and accomodation.  Facial symmetry is present. There is good  facial sensation to soft touch bilaterally.Facial strength is normal.  Trapezius and sternocleidomastoid strength is normal. No dysarthria is noted.    No obvious hearing deficits are noted.  Motor:  Muscle bulk is normal.   Tone is normal. Strength is  5 / 5 in all 4 extremities.   Sensory: Sensory testing is intact to pinprick, soft touch and vibration sensation in all 4 extremities.  Coordination: Cerebellar testing reveals good finger-nose-finger and heel-to-shin bilaterally.  Gait and station: Station is normal.   Gait is normal. Tandem gait is wide.  Romberg is negative.   Reflexes: Deep tendon reflexes are symmetric and normal bilaterally.   Plantar responses are flexor.    DIAGNOSTIC DATA (LABS, IMAGING, TESTING) - I reviewed patient records, labs, notes, testing and imaging myself where available.  Lab Results  Component Value Date   WBC 4.4 10/24/2020   HGB 13.4 10/24/2020   HCT 39.1 10/24/2020   MCV 87.2 10/24/2020   PLT 247.0 10/24/2020      Component Value Date/Time   NA 141 10/24/2020 1404   K 3.6 10/24/2020 1404   CL 102 10/24/2020 1404   CO2 29 10/24/2020 1404   GLUCOSE 92 10/24/2020 1404   BUN 20 10/24/2020 1404   CREATININE 0.92 10/24/2020 1404   CALCIUM 9.5 10/24/2020 1404   PROT 6.7 09/27/2020 1429   ALBUMIN 4.3 09/27/2020 1429   AST 18 09/27/2020 1429   ALT 11 09/27/2020 1429   ALKPHOS 49 09/27/2020 1429   BILITOT 0.5 09/27/2020 1429   Lab Results  Component Value Date   CHOL 200 05/19/2020   HDL 81.70 05/19/2020   LDLCALC 102 (H) 05/19/2020   LDLDIRECT 103.0 04/13/2018   TRIG 80.0 05/19/2020   CHOLHDL 2 05/19/2020   No results found for: HGBA1C Lab Results  Component Value Date   VITAMINB12 284 10/24/2020   Lab Results  Component Value Date   TSH 0.90 10/24/2020       ASSESSMENT AND PLAN  Alzheimer disease ([place]) - Plan: MR BRAIN WO CONTRAST  Depression with anxiety   In summary,  Ms. Degroote is a 80 year old woman who has had  progressive cognitive decline, predominantly affecting memory, over the last 2 to 3 years.  She scored 19/30 on the [facility] cognitive assessment placing her in the mild dementia range.  She missed all 5 points for short-term memory.  Her physical exam was normal for her age, including gait.  I believe she most likely has Alzheimer's disease.  We do need to check an MRI study to determine if there is significant overlap from vascular dementia or other issues.  This will give [place] the opportunity to check the atrophy pattern.  If she does have more atrophy in the medial temporal lobes and elsewhere, Alzheimer's becomes even more likely.  I will go ahead and start donepezil 5 mg and we will titrate up if she tolerates the medication.  Additionally, she has anxiety and some depression and I will start low-dose Escitalopram.  They will return to see me in 4 months or sooner if there are new or worsening neurologic symptoms.  Thank you for asking me to see Ms. Norman Herrlich.  Please let me know if I can be of further assistance with her or other patients in the future.  Izaia Say A. Felecia Shelling, MD, [facility] 03/27/4174, 3:01 AM Certified in Neurology, Clinical Neurophysiology, Sleep Medicine and Neuroimaging  [facility] [address], [place], [place] [address] [phone]

## 2020-10-30 NOTE — Telephone Encounter (Signed)
Health team order sent to GI. No auth they will reach out to the patient to schedule.  

## 2020-11-01 ENCOUNTER — Ambulatory Visit (INDEPENDENT_AMBULATORY_CARE_PROVIDER_SITE_OTHER): Payer: PPO | Admitting: *Deleted

## 2020-11-01 ENCOUNTER — Other Ambulatory Visit: Payer: Self-pay

## 2020-11-01 ENCOUNTER — Ambulatory Visit: Payer: PPO

## 2020-11-01 DIAGNOSIS — E538 Deficiency of other specified B group vitamins: Secondary | ICD-10-CM

## 2020-11-01 MED ORDER — CYANOCOBALAMIN 1000 MCG/ML IJ SOLN
1000.0000 ug | Freq: Once | INTRAMUSCULAR | Status: AC
  Administered 2020-11-01: 1000 ug via INTRAMUSCULAR

## 2020-11-01 NOTE — Progress Notes (Signed)
Pt received IM B12 injection in left deltoid. Pt tolerated it well.

## 2020-11-03 ENCOUNTER — Telehealth: Payer: Self-pay

## 2020-11-03 NOTE — Telephone Encounter (Signed)
I called the patient to schedule a lab appointment and LVM to call back.  Jamahl Lemmons,cma

## 2020-11-03 NOTE — Telephone Encounter (Signed)
-----   Message from Leone Haven, MD sent at 10/23/2020  9:29 AM EST ----- Noted. Can we recheck a BMET in 2-3 weeks? Orders placed.

## 2020-11-06 NOTE — Progress Notes (Deleted)
Lauren [name] Phone: [phone] Subjective:    I'm seeing this patient by the request  of:  Lauren Haven, MD  CC:   FOY:[id number]   09/26/2020 Patient had a callus noted over the head of the fifth metatarsal.  Patient's did have very dry skin broken down today and then had liquid nitrogen placed.  Patient tolerated the procedure well.  Discussed doing a over-the-counter wart remover cream for daily for 1 week and then falling down the area, discussed using lotion regularly.  Follow-up with me again in 6 weeks in case it seems to return.  Update 11/07/2020 Lauren [name] is a 80 y.o. female coming in with complaint of left foot pain.   Onset-  Location Duration-  Character- Aggravating factors- Reliving factors-  Therapies tried-  Severity-     Past Medical History:  Diagnosis Date  . Allergic rhinitis   . Allergy   . Cataract    removed by surgery  . 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 Chickenpox   . Elevated LDL cholesterol level 11/26/2015   no meds, diet controlled  . Gallstones    removed by surgery  . Glaucoma   . HTN (hypertension)   . Hx of adenomatous and sessile serrated colonic polyps 09/02/2018  . Incontinence of urine in female 10/05/2015  . Kidney stones    removed with gallbladder surgery per patient  . Migraines   . Urinary incontinence    Past Surgical History:  Procedure Laterality Date  . CHOLECYSTECTOMY  1995  . COLONOSCOPY  08/27/2018   TA polyps/ Gessner  . EYE SURGERY Bilateral    cataracts removed/laser  . SACRAL NERVE STIMULATOR PLACEMENT  04/2018   To control overactive bladder  . TONSILLECTOMY  1962  . TUBAL LIGATION     Social History   Socioeconomic History  . Marital status: Widowed    Spouse name: Not on file  . Number of children: 2  . Years of education: Not on file  . Highest education level: Not on file  Occupational History  . Occupation: retired   Tobacco Use  . Smoking status: Former Smoker    Types: Cigarettes    Quit date: 08/06/1993    Years since quitting: 27.2  . Smokeless tobacco: Never Used  Vaping Use  . Vaping Use: Never used  Substance and Sexual Activity  . Alcohol use: No    Alcohol/week: 0.0 standard drinks  . Drug use: No  . Sexual activity: Not Currently    Birth control/protection: Post-menopausal  Other Topics Concern  . Not on file  Social History Narrative   Right handed   Widowed   1 son and 1 daughter   She is a retired Radiation protection practitioner   Former smoker   No alcohol or caffeine or drugs or any tobacco   Social Determinants of Radio broadcast assistant Strain: Not on file  Food Insecurity: Not on file  Transportation Needs: Not on file  Physical Activity: Not on file  Stress: Not on file  Social Connections: Not on file   Allergies  Allergen Reactions  . Gabapentin Other (See Comments)    Unable to sleep  . Hydrochlorothiazide Other (See Comments)    Leg pain and aches  . Hydrocodone-Guaifenesin Other (See Comments)    Cant sleep   . Influenza Vaccines Other (See Comments)    Nausea and vomiting and stomach pain for 10 weeks  . Mobic [  Meloxicam] Swelling  . Oxybutynin     Memory loss per patient  . Sulfa Antibiotics Other (See Comments)    Cold sweats, passing out   . Codeine Rash    Cold sweats    Family History  Problem Relation Age of Onset  . Hematuria Father   . Heart disease Father   . Arthritis Father   . Heart disease Mother   . COPD Mother   . Atrial fibrillation Mother   . Hypertension Mother   . Colon cancer Daughter   . Rectal cancer Neg Hx   . Stomach cancer Neg Hx   . Esophageal cancer Neg Hx      Current Facility-Administered Medications (Endocrine & Metabolic):  .  betamethasone acetate-betamethasone sodium phosphate (CELESTONE) injection 3 mg  Current Outpatient Medications (Cardiovascular):  .  amLODipine (NORVASC) 5 MG tablet, Take 1 tablet (5 mg total)  by mouth daily. 1 tablet every other day   Current Outpatient Medications (Respiratory):  .  fluticasone (FLONASE) 50 MCG/ACT nasal spray, SHAKE LIQUID AND USE 2 SPRAYS IN EACH NOSTRIL DAILY       Current Outpatient Medications (Other):  Marland Kitchen  Ascorbic Acid (VITAMIN C) 100 MG tablet, Take 100 mg by mouth daily. Marland Kitchen  donepezil (ARICEPT) 5 MG tablet, Take 1 tablet (5 mg total) by mouth at bedtime. Marland Kitchen  escitalopram (LEXAPRO) 5 MG tablet, Take 1 tablet (5 mg total) by mouth daily. Marland Kitchen  latanoprost (XALATAN) 0.005 % ophthalmic solution, Place 1 drop into both eyes at bedtime.  .  NON FORMULARY, Vitamin B 12 Injections q month, Last injection 11/09/19    Reviewed prior external information including notes and imaging from  primary care provider As well as notes that were available from care everywhere and other healthcare systems.  Past medical history, social, surgical and family history all reviewed in electronic medical record.  No pertanent information unless stated regarding to the chief complaint.   Review of Systems:  No headache, visual changes, nausea, vomiting, diarrhea, constipation, dizziness, abdominal pain, skin rash, fevers, chills, night sweats, weight loss, swollen lymph nodes, body aches, joint swelling, chest pain, shortness of breath, mood changes. POSITIVE muscle aches  Objective  There were no vitals taken for this visit.   General: No apparent distress alert and oriented x3 mood and affect normal, dressed appropriately.  HEENT: Pupils equal, extraocular movements intact  Respiratory: Patient's speak in full sentences and does not appear short of breath  Cardiovascular: No lower extremity edema, non tender, no erythema  Gait normal with good balance and coordination.  MSK:  Non tender with full range of motion and good stability and symmetric strength and tone of shoulders, elbows, wrist, hip, knee and ankles bilaterally.     Impression and Recommendations:     The  above documentation has been reviewed and is accurate and complete Lauren [name]

## 2020-11-07 ENCOUNTER — Ambulatory Visit: Payer: PPO | Admitting: Family Medicine

## 2020-11-08 ENCOUNTER — Telehealth: Payer: Self-pay | Admitting: Neurology

## 2020-11-08 NOTE — Telephone Encounter (Signed)
Patient daughter Olivia Mackie called stating that GI said her mother could not have the MRI at their office due to the bladder implant is not safe with their coils. She gave me the bladder implant info and I faxed it to [facility] to see if it would be safe to have at the hospital. She is aware once they research it that they or I will give her a call to have the MRI schedule if it is safe.  Bladder implant info: Name: [employer] # [id number] Serial # [id number] Implant date: 05/20/2018

## 2020-11-08 NOTE — Telephone Encounter (Signed)
Pt's daughter, Zollie Beckers (on [place]) called, she started two new medications last week; Doneplezil and Escitalopram. She has become nostalgic, no energy, shaky. Would like a call from the nurse.

## 2020-11-08 NOTE — Telephone Encounter (Signed)
Called daughter back and relayed Dr. Garth Bigness recommendation. She will have her hold donepezil and continue escitalopram. They will call back if sx do not improve. Advised she should take med w/ food/water, not on empty stomach. She verbalized understanding .

## 2020-11-08 NOTE — Progress Notes (Unsigned)
Late entry from 11/01/20.

## 2020-11-08 NOTE — Telephone Encounter (Signed)
Spoke w/ MD. He suggests having her hold donepezil and continue escitalopram to see if this helps.

## 2020-11-09 ENCOUNTER — Telehealth: Payer: Self-pay

## 2020-11-09 NOTE — Telephone Encounter (Signed)
I called the patient to schedule a lab appointment for a recheck.  Nina,cma

## 2020-11-09 NOTE — Telephone Encounter (Signed)
-----   Message from Leone Haven, MD sent at 10/23/2020  9:29 AM EST ----- Noted. Can we recheck a BMET in 2-3 weeks? Orders placed.

## 2020-11-13 NOTE — Progress Notes (Signed)
I have reviewed the above note and agree.  Kirubel Aja, M.D.  

## 2020-11-17 ENCOUNTER — Telehealth: Payer: Self-pay | Admitting: Neurology

## 2020-11-17 NOTE — Telephone Encounter (Signed)
[place] [facility]) called, requesting a copy of Pt's bladder implant card. Physician requested MRI of brain, need to review patient's implant device. Would like a call from the nurse  Contact info: [phone]

## 2020-11-19 ENCOUNTER — Other Ambulatory Visit: Payer: Self-pay | Admitting: Family Medicine

## 2020-11-20 NOTE — Telephone Encounter (Signed)
Patient is scheduled at [facility] for 11/30/20.

## 2020-11-20 NOTE — Telephone Encounter (Signed)
Patient is scheduled at [facility] for 11/30/20.

## 2020-11-30 ENCOUNTER — Other Ambulatory Visit (INDEPENDENT_AMBULATORY_CARE_PROVIDER_SITE_OTHER): Payer: PPO

## 2020-11-30 ENCOUNTER — Other Ambulatory Visit: Payer: Self-pay

## 2020-11-30 ENCOUNTER — Ambulatory Visit
Admission: RE | Admit: 2020-11-30 | Discharge: 2020-11-30 | Disposition: A | Discharge status: Home / Self Care | Payer: PPO | Source: Ambulatory Visit | Attending: Neurology | Admitting: Neurology

## 2020-11-30 DIAGNOSIS — N179 Acute kidney failure, unspecified: Secondary | ICD-10-CM

## 2020-11-30 DIAGNOSIS — F0280 Dementia in other diseases classified elsewhere without behavioral disturbance: Secondary | ICD-10-CM

## 2020-11-30 DIAGNOSIS — R413 Other amnesia: Secondary | ICD-10-CM | POA: Diagnosis not present

## 2020-11-30 DIAGNOSIS — G309 Alzheimer's disease, unspecified: Secondary | ICD-10-CM | POA: Insufficient documentation

## 2020-11-30 DIAGNOSIS — G319 Degenerative disease of nervous system, unspecified: Secondary | ICD-10-CM | POA: Diagnosis not present

## 2020-11-30 LAB — BASIC METABOLIC PANEL
BUN: 17 mg/dL (ref 6–23)
CO2: 33 mEq/L — ABNORMAL HIGH (ref 19–32)
Calcium: 9.6 mg/dL (ref 8.4–10.5)
Chloride: 103 mEq/L (ref 96–112)
Creatinine, Ser: 0.94 mg/dL (ref 0.40–1.20)
GFR: 58.34 mL/min — ABNORMAL LOW (ref >60.00)
Glucose, Bld: 90 mg/dL (ref 70–99)
Potassium: 4.1 mEq/L (ref 3.5–5.1)
Sodium: 142 mEq/L (ref 135–145)

## 2020-12-04 ENCOUNTER — Telehealth: Payer: Self-pay | Admitting: *Deleted

## 2020-12-04 ENCOUNTER — Telehealth: Payer: Self-pay | Admitting: Family Medicine

## 2020-12-04 NOTE — Telephone Encounter (Signed)
Called and spoke with pt daughter (on [place]). Relayed results per Dr. Garth Bigness note. She already restarted donepezil. They found previous SE d/t med she was on for UTI. She did report this weekend was a bad weekend, she was talking about her Dad who is deceased (Seeing things). She is going to contact PCP to have urine rechecked for possible reoccurring UTI. If this is clear, she will follow back up with our office.

## 2020-12-04 NOTE — Telephone Encounter (Signed)
-----   Message from Britt Bottom, MD sent at 11/30/2020  6:34 PM EDT ----- Please let the family know that the MRi shows atrophy consistent with Alzheimer's   I would like her to re-try the donepezil 5 mg once a day (at night might be best)

## 2020-12-04 NOTE — Telephone Encounter (Signed)
Called patient to schedule Prolia and spoke with daughter Tressia Miners, she stated her mother has ben and acting more confused. Patient daughter stated she believes as before she may have UTI she says patient has HX of confusion with UTI . I advised may need to be evaluated sooner , but that this would require visit she was fine this earliest appointment was 12/06/20. Patient scheduled. Advised symptoms worsen to have evaluated sooner daughter agreed.

## 2020-12-04 NOTE — Telephone Encounter (Signed)
Noted. Agree with need for an appointment. If she has any worsening symptoms or other symptoms such as those concerning for a stroke she should be evaluated sooner.

## 2020-12-04 NOTE — Telephone Encounter (Signed)
Patient daughter advised

## 2020-12-06 ENCOUNTER — Ambulatory Visit (INDEPENDENT_AMBULATORY_CARE_PROVIDER_SITE_OTHER): Payer: PPO | Admitting: Family Medicine

## 2020-12-06 ENCOUNTER — Other Ambulatory Visit: Payer: Self-pay

## 2020-12-06 ENCOUNTER — Encounter: Payer: Self-pay | Admitting: Family Medicine

## 2020-12-06 VITALS — BP 160/60 | HR 91 | Temp 98.5°F | Ht 61.0 in | Wt 138.0 lb

## 2020-12-06 DIAGNOSIS — R410 Disorientation, unspecified: Secondary | ICD-10-CM | POA: Diagnosis not present

## 2020-12-06 DIAGNOSIS — E538 Deficiency of other specified B group vitamins: Secondary | ICD-10-CM

## 2020-12-06 DIAGNOSIS — F0280 Dementia in other diseases classified elsewhere without behavioral disturbance: Secondary | ICD-10-CM | POA: Diagnosis not present

## 2020-12-06 DIAGNOSIS — M810 Age-related osteoporosis without current pathological fracture: Secondary | ICD-10-CM

## 2020-12-06 DIAGNOSIS — G309 Alzheimer's disease, unspecified: Secondary | ICD-10-CM | POA: Diagnosis not present

## 2020-12-06 LAB — POCT URINALYSIS DIPSTICK
Bilirubin, UA: NEGATIVE
Blood, UA: NEGATIVE
Glucose, UA: NEGATIVE
Ketones, UA: NEGATIVE
Leukocytes, UA: NEGATIVE
Nitrite, UA: NEGATIVE
Protein, UA: NEGATIVE
Spec Grav, UA: 1.025 (ref 1.010–1.025)
Urobilinogen, UA: 0.2 E.U./dL
pH, UA: 6 (ref 5.0–8.0)

## 2020-12-06 MED ORDER — DENOSUMAB 60 MG/ML ~~LOC~~ SOSY
60.0000 mg | PREFILLED_SYRINGE | Freq: Once | SUBCUTANEOUS | Status: AC
  Administered 2020-12-06: 60 mg via SUBCUTANEOUS

## 2020-12-06 MED ORDER — CYANOCOBALAMIN 1000 MCG/ML IJ SOLN
1000.0000 ug | Freq: Once | INTRAMUSCULAR | Status: AC
  Administered 2020-12-06: 1000 ug via INTRAMUSCULAR

## 2020-12-06 NOTE — Progress Notes (Signed)
Lauren [name] presents today for injection per MD orders. B12 injection  administered SQ in left Upper Arm. Administration without incident. Patient tolerated well. Lauren [name] presents today for injection per MD orders. Prolia injection  administered SQ in right Upper Arm. Administration without incident. Patient tolerated well.   Treyvon Blahut,cma

## 2020-12-06 NOTE — Assessment & Plan Note (Signed)
She is due for her Prolia injection.  This will be given today.

## 2020-12-06 NOTE — Patient Instructions (Signed)
Nice to see you. We will let you know what your lab results show. Please take the Aricept that Dr. Felecia Shelling (the neurologist) started you on.

## 2020-12-06 NOTE — Assessment & Plan Note (Addendum)
Recent diagnosis through neurology.  Suspect some confusion related to her chronic memory difficulties.  We will check a urinalysis for completeness sake.  We will also check other lab work to ensure that her B12 is acceptable.  She will make sure she is taking the Aricept that was prescribed by neurology.

## 2020-12-06 NOTE — Progress Notes (Signed)
Tommi Rumps, MD Phone: [phone]  Lauren [name] is a 80 y.o. female who presents today for follow-up.  Alzheimer's dementia/confusion: Patient recently diagnosed with Alzheimer's through neurology.  The patient does not recall seeing neurology.  She is unsure if she is taking the Aricept.  She thinks her confusion is age-related.  There is a message from her daughter that notes the patient has been a little more confused recently and they were concerned about a UTI.  The patient denies dysuria and frequency.  She reports some chronic urgency.  No blood in her urine.  She denies any respiratory symptoms.  She notes she feels great overall.  The patient reports possible confusion if she has 5 different places to go.  Social History   Tobacco Use  Smoking Status Former Smoker  . Types: Cigarettes  . Quit date: 08/06/1993  . Years since quitting: 27.3  Smokeless Tobacco Never Used    Current Outpatient Medications on File Prior to Visit  Medication Sig Dispense Refill  . amLODipine (NORVASC) 5 MG tablet Take 1 tablet (5 mg total) by mouth daily. 1 tablet every other day 90 tablet 2  . Ascorbic Acid (VITAMIN C) 100 MG tablet Take 100 mg by mouth daily.    Marland Kitchen donepezil (ARICEPT) 5 MG tablet Take 1 tablet (5 mg total) by mouth at bedtime. 90 tablet 1  . escitalopram (LEXAPRO) 5 MG tablet Take 1 tablet (5 mg total) by mouth daily. 90 tablet 3  . fluticasone (FLONASE) 50 MCG/ACT nasal spray SHAKE LIQUID AND USE 2 SPRAYS IN EACH NOSTRIL DAILY 48 g 0  . latanoprost (XALATAN) 0.005 % ophthalmic solution Place 1 drop into both eyes at bedtime.   1  . NON FORMULARY Vitamin B 12 Injections q month, Last injection 11/09/19     Current Facility-Administered Medications on File Prior to Visit  Medication Dose Route Frequency Provider Last Rate Last Admin  . betamethasone acetate-betamethasone sodium phosphate (CELESTONE) injection 3 mg  3 mg Intramuscular Once Edrick Kins, DPM         ROS  see history of present illness  Objective  Physical Exam Vitals:   12/06/20 1536  BP: (!) 160/60  Pulse: 91  Temp: 98.5 F (36.9 C)  SpO2: 96%    BP Readings from Last 3 Encounters:  12/06/20 (!) 160/60  10/30/20 (!) 170/84  10/24/20 (!) 144/82   Wt Readings from Last 3 Encounters:  12/06/20 138 lb (62.6 kg)  10/30/20 140 lb 8 oz (63.7 kg)  10/24/20 141 lb (64 kg)    Physical Exam Constitutional:      General: She is not in acute distress.    Appearance: She is not diaphoretic.  Cardiovascular:     Rate and Rhythm: Normal rate and regular rhythm.     Heart sounds: Normal heart sounds.  Pulmonary:     Effort: Pulmonary effort is normal.     Breath sounds: Normal breath sounds.  Skin:    General: Skin is warm and dry.  Neurological:     Mental Status: She is alert.      Assessment/Plan: Please see individual problem list.  Problem List Items Addressed This Visit    Osteoporosis    She is due for her Prolia injection.  This will be given today.      Low serum vitamin B12    B12 given today in the office.      Alzheimer disease [facility])    Recent diagnosis through neurology.  Suspect some confusion related to her chronic memory difficulties.  We will check a urinalysis for completeness sake.  We will also check other lab work to ensure that her B12 is acceptable.  She will make sure she is taking the Aricept that was prescribed by neurology.       Other Visit Diagnoses    Confusion    -  Primary   Relevant Orders   Basic Metabolic Panel (BMET)   POCT Urinalysis Dipstick   B12 deficiency       Relevant Medications   cyanocobalamin ((VITAMIN B-12)) injection 1,000 mcg (Completed)   Other Relevant Orders   B12       This visit occurred during the SARS-CoV-2 public health emergency.  Safety protocols were in place, including screening questions prior to the visit, additional usage of staff PPE, and extensive cleaning of exam room while observing  appropriate contact time as indicated for disinfecting solutions.    Tommi Rumps, MD [place]

## 2020-12-06 NOTE — Assessment & Plan Note (Signed)
B12 given today in the office.

## 2020-12-07 ENCOUNTER — Ambulatory Visit: Payer: PPO

## 2020-12-07 LAB — BASIC METABOLIC PANEL
BUN: 16 mg/dL (ref 6–23)
CO2: 32 mEq/L (ref 19–32)
Calcium: 9.4 mg/dL (ref 8.4–10.5)
Chloride: 102 mEq/L (ref 96–112)
Creatinine, Ser: 1.03 mg/dL (ref 0.40–1.20)
GFR: 52.27 mL/min — ABNORMAL LOW (ref >60.00)
Glucose, Bld: 134 mg/dL — ABNORMAL HIGH (ref 70–99)
Potassium: 3.9 mEq/L (ref 3.5–5.1)
Sodium: 141 mEq/L (ref 135–145)

## 2020-12-07 LAB — VITAMIN B12: Vitamin B-12: 368 pg/mL (ref 211–911)

## 2020-12-12 ENCOUNTER — Other Ambulatory Visit: Payer: Self-pay | Admitting: Family Medicine

## 2020-12-12 ENCOUNTER — Telehealth: Payer: Self-pay

## 2020-12-12 DIAGNOSIS — N1831 Chronic kidney disease, stage 3a: Secondary | ICD-10-CM

## 2020-12-12 NOTE — Telephone Encounter (Signed)
I called the patient's daughter to schedule a recheck of the patient's kidney function.  She needs a appointment scheduled for labs, her mailbox was full.  Vanity Larsson,cma

## 2020-12-12 NOTE — Telephone Encounter (Signed)
-----   Message from Leone Haven, MD sent at 12/12/2020 12:29 PM EDT ----- Noted.  I would like to recheck her kidney function in about a week.  Orders placed.

## 2021-01-12 ENCOUNTER — Ambulatory Visit: Payer: PPO | Admitting: Family Medicine

## 2021-01-15 ENCOUNTER — Other Ambulatory Visit: Payer: Self-pay

## 2021-01-19 ENCOUNTER — Encounter: Payer: Self-pay | Admitting: Family Medicine

## 2021-01-19 ENCOUNTER — Other Ambulatory Visit: Payer: Self-pay

## 2021-01-19 ENCOUNTER — Ambulatory Visit (INDEPENDENT_AMBULATORY_CARE_PROVIDER_SITE_OTHER): Payer: Medicare Other | Admitting: Family Medicine

## 2021-01-19 DIAGNOSIS — G309 Alzheimer's disease, unspecified: Secondary | ICD-10-CM | POA: Diagnosis not present

## 2021-01-19 DIAGNOSIS — F418 Other specified anxiety disorders: Secondary | ICD-10-CM | POA: Diagnosis not present

## 2021-01-19 DIAGNOSIS — I10 Essential (primary) hypertension: Secondary | ICD-10-CM

## 2021-01-19 DIAGNOSIS — F0280 Dementia in other diseases classified elsewhere without behavioral disturbance: Secondary | ICD-10-CM

## 2021-01-19 NOTE — Assessment & Plan Note (Signed)
Adequate control.  She will continue amlodipine 5 mg every other day.

## 2021-01-19 NOTE — Assessment & Plan Note (Addendum)
Seems to have improved on the Aricept.  She will continue Aricept.  She would like for me to take over this prescription so that she does not have to continue to see neurology.  I discussed that that would be fine at this time though if things worsen she may have to go back to neurology.  I did advise limited driving and advised that if anything seem to be unsafe with her driving she should no longer drive.

## 2021-01-19 NOTE — Assessment & Plan Note (Signed)
She will continue Lexapro 5 mg once daily.

## 2021-01-19 NOTE — Patient Instructions (Signed)
Nice to see you. Please continue with your Aricept and Lexapro.

## 2021-01-19 NOTE — Progress Notes (Signed)
Tommi Rumps, MD Phone: [phone]  Lauren [name] is a 80 y.o. female who presents today for follow-up.  Dementia: The patient is on Aricept and notes that has been quite beneficial.  She and her son both note less confusion.  She does have up-and-down days.  Her son notes the most difficult thing is she does not know the date or time oftentimes.  She gets times confused.  They note the neurologist recommended limiting driving to daytime and a 5 mile limit around her house.  She did get lost on 1 occasion.  She had an MRI that revealed moderate chronic microvascular ischemic disease and generalized cerebral atrophy.  She does report having a sharp headache on 1 occasion that occurred prior to having the MRI.  It has not recurred.  Hypertension: Typically 761P systolically.  Taking amlodipine 5 mg every other day.  No chest pain or shortness of breath.  Anxiety/depression: Currently on Lexapro 5 mg once daily.  Sometimes she is upset and feels down as she is not able to do what she used to do that generally denies anxiety and depression.  Social History   Tobacco Use  Smoking Status Former Smoker  . Types: Cigarettes  . Quit date: 08/06/1993  . Years since quitting: 27.4  Smokeless Tobacco Never Used    Current Outpatient Medications on File Prior to Visit  Medication Sig Dispense Refill  . amLODipine (NORVASC) 5 MG tablet Take 1 tablet (5 mg total) by mouth daily. 1 tablet every other day 90 tablet 2  . Ascorbic Acid (VITAMIN C) 100 MG tablet Take 100 mg by mouth daily.    Marland Kitchen donepezil (ARICEPT) 5 MG tablet Take 1 tablet (5 mg total) by mouth at bedtime. 90 tablet 1  . escitalopram (LEXAPRO) 5 MG tablet Take 1 tablet (5 mg total) by mouth daily. 90 tablet 3  . fluticasone (FLONASE) 50 MCG/ACT nasal spray SHAKE LIQUID AND USE 2 SPRAYS IN EACH NOSTRIL DAILY 48 g 0  . latanoprost (XALATAN) 0.005 % ophthalmic solution Place 1 drop into both eyes at bedtime.   1  . NON FORMULARY Vitamin  B 12 Injections q month, Last injection 11/09/19     Current Facility-Administered Medications on File Prior to Visit  Medication Dose Route Frequency Provider Last Rate Last Admin  . betamethasone acetate-betamethasone sodium phosphate (CELESTONE) injection 3 mg  3 mg Intramuscular Once Edrick Kins, DPM         ROS see history of present illness  Objective  Physical Exam Vitals:   01/19/21 1536 01/19/21 1600  BP: (!) 152/68 128/62  Pulse: 83   Temp: 98.1 F (36.7 C)   SpO2: 98%     BP Readings from Last 3 Encounters:  01/19/21 128/62  12/06/20 (!) 160/60  10/30/20 (!) 170/84   Wt Readings from Last 3 Encounters:  01/19/21 142 lb (64.4 kg)  12/06/20 138 lb (62.6 kg)  10/30/20 140 lb 8 oz (63.7 kg)    Physical Exam Constitutional:      General: She is not in acute distress.    Appearance: She is not diaphoretic.  Cardiovascular:     Rate and Rhythm: Normal rate and regular rhythm.     Heart sounds: Normal heart sounds.  Pulmonary:     Effort: Pulmonary effort is normal.     Breath sounds: Normal breath sounds.  Skin:    General: Skin is warm and dry.  Neurological:     Mental Status: She is alert.  Assessment/Plan: Please see individual problem list.  Problem List Items Addressed This Visit    HTN (hypertension)    Adequate control.  She will continue amlodipine 5 mg every other day.      Alzheimer disease ([place])    Seems to have improved on the Aricept.  She will continue Aricept.  She would like for me to take over this prescription so that she does not have to continue to see neurology.  I discussed that that would be fine at this time though if things worsen she may have to go back to neurology.  I did advise limited driving and advised that if anything seem to be unsafe with her driving she should no longer drive.      Depression with anxiety    She will continue Lexapro 5 mg once daily.        Return in about 3 months (around 04/21/2021)  for Dementia.  This visit occurred during the SARS-CoV-2 public health emergency.  Safety protocols were in place, including screening questions prior to the visit, additional usage of staff PPE, and extensive cleaning of exam room while observing appropriate contact time as indicated for disinfecting solutions.    Tommi Rumps, MD [place]

## 2021-02-16 IMAGING — MG DIGITAL SCREENING BILATERAL MAMMOGRAM WITH TOMO AND CAD
8 series · 8 of 24 positions shown · non-contrast
Comparison: Previous exam(s).

CLINICAL DATA: Screening.

EXAM:
DIGITAL SCREENING BILATERAL MAMMOGRAM WITH TOMO AND CAD

[L CC synth-2D]
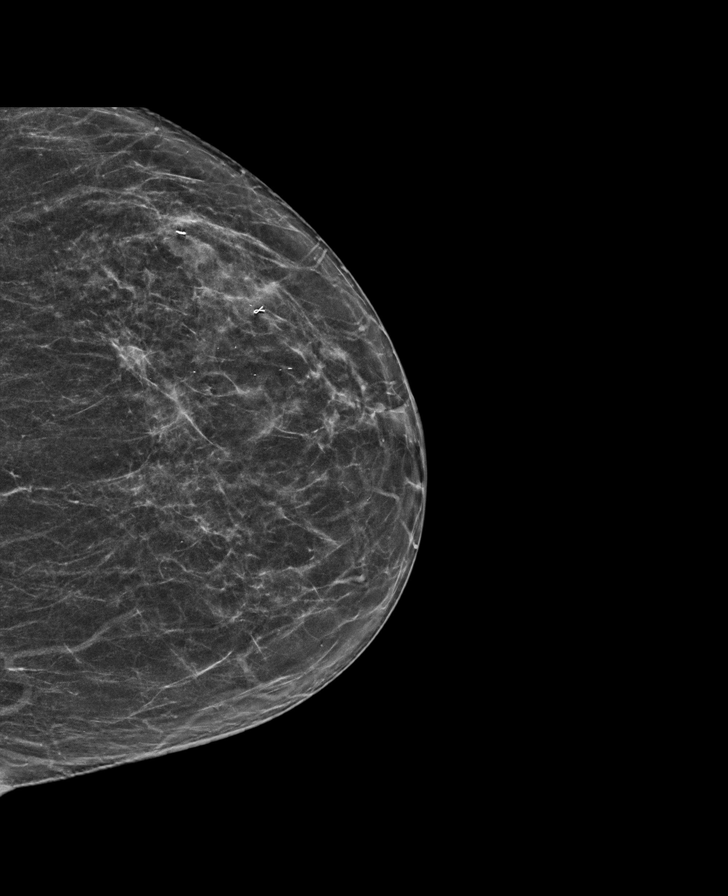

[L MLO synth-2D]
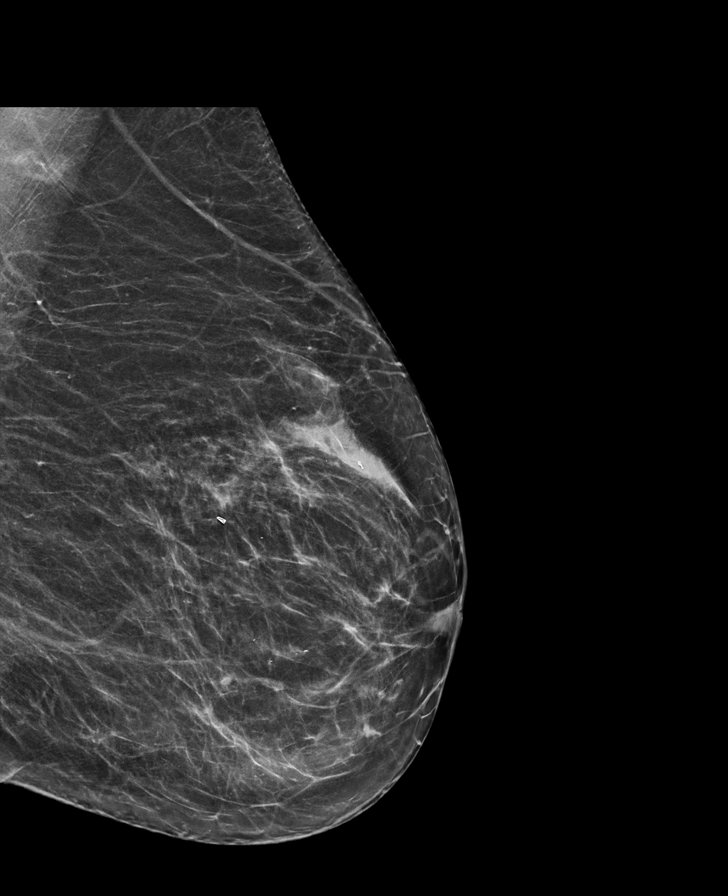

[R MLO synth-2D]
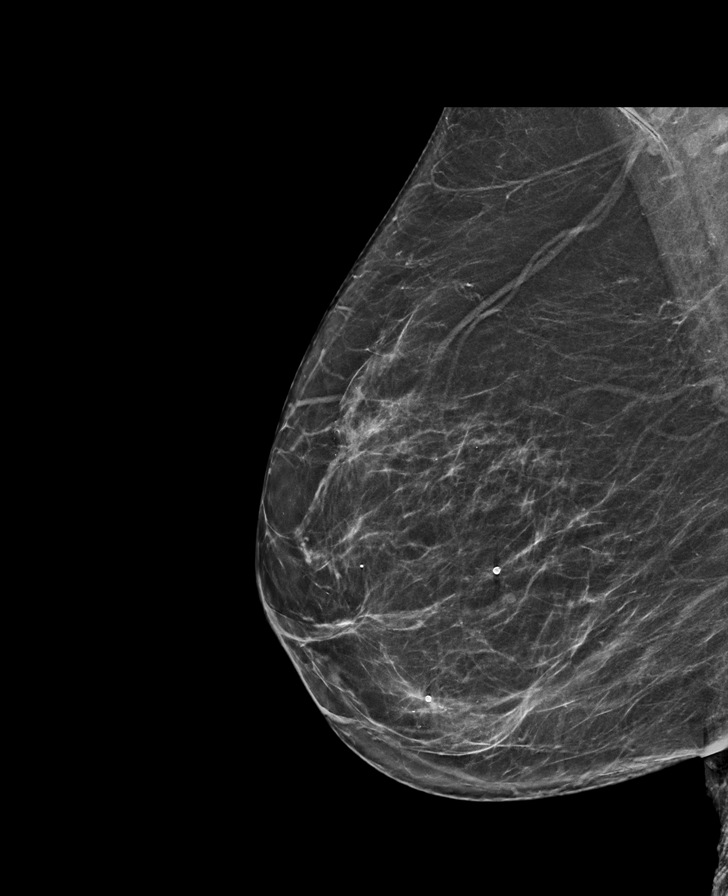

[R CC synth-2D]
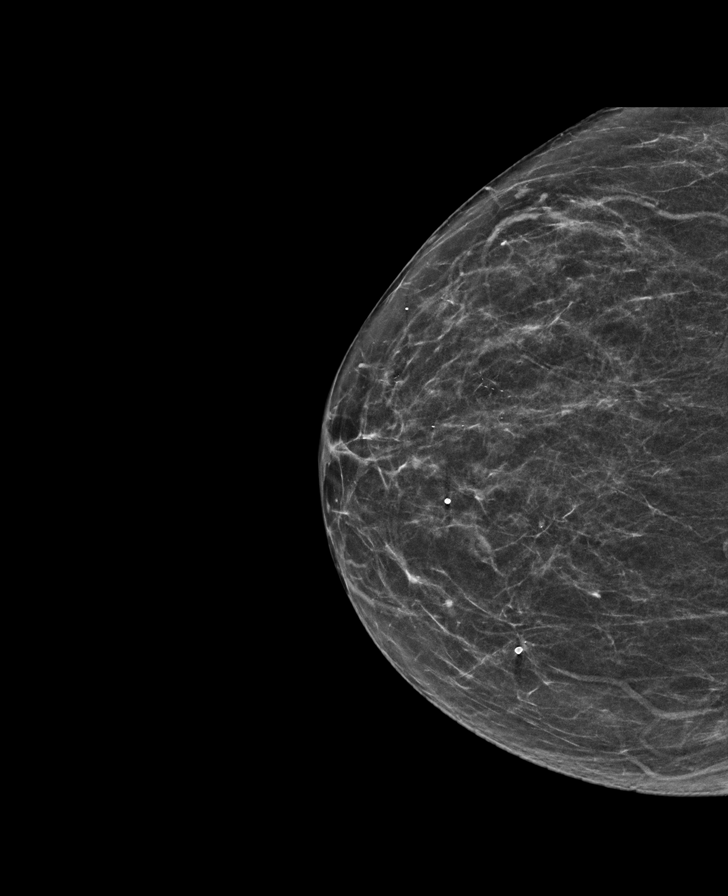

[R MLO tomo · tomo slice 37/73.0]
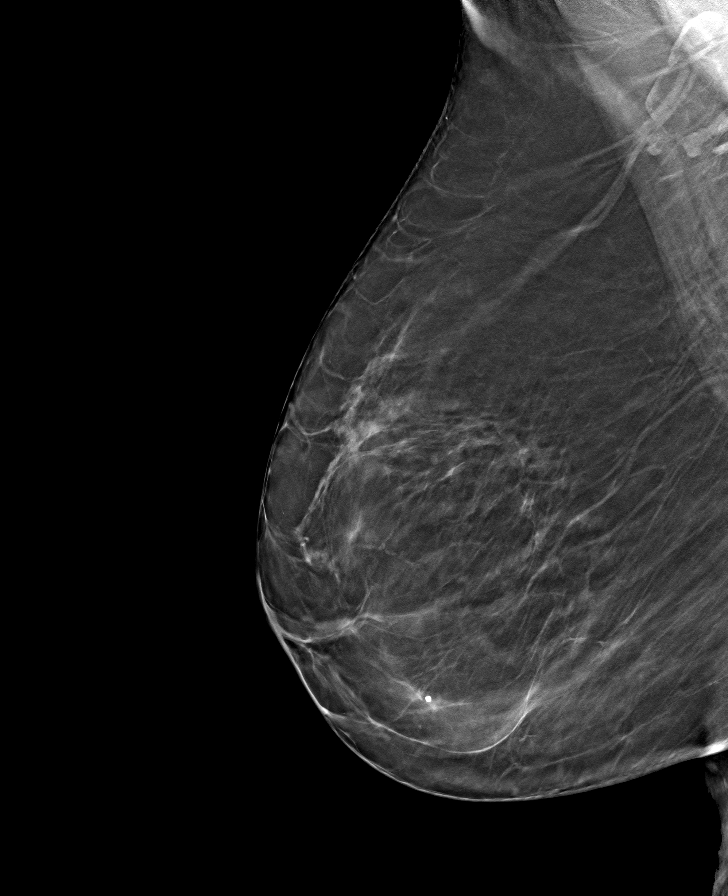

[L MLO tomo · tomo slice 36/71.0]
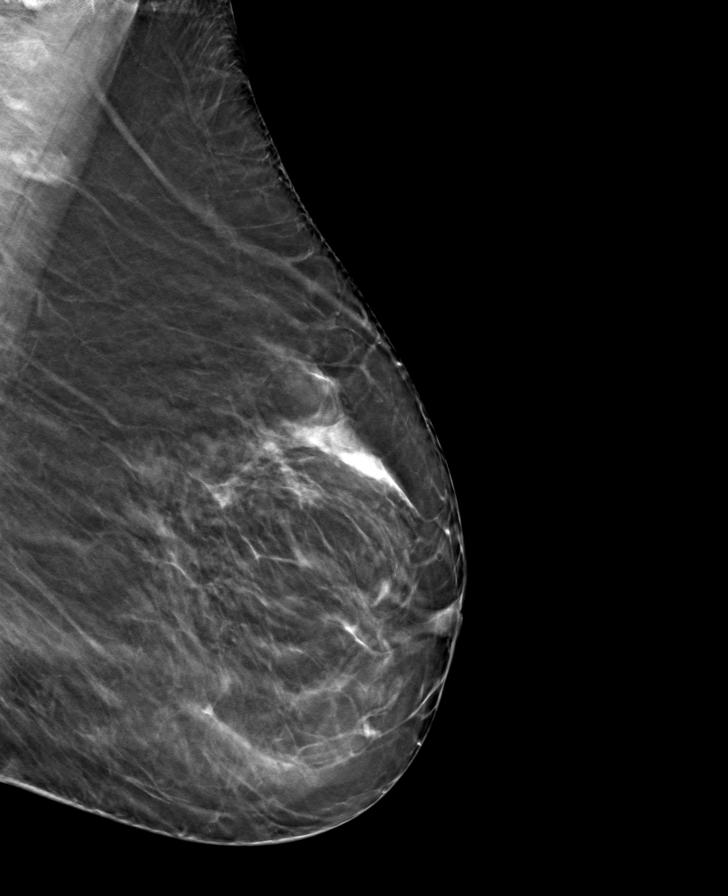

[R CC tomo · tomo slice 33/64.0]
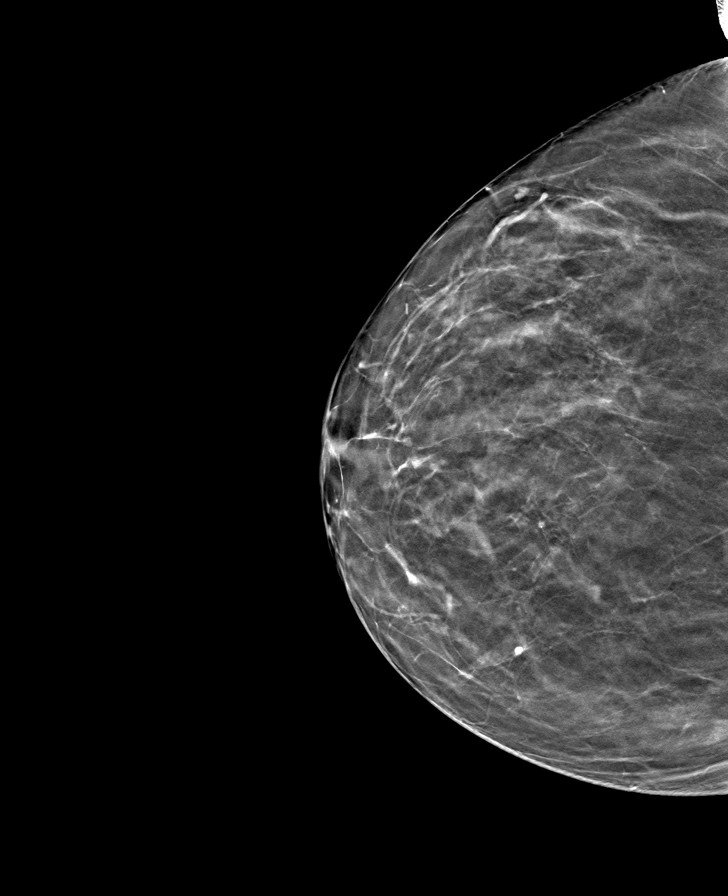

[L CC tomo · tomo slice 34/67.0]
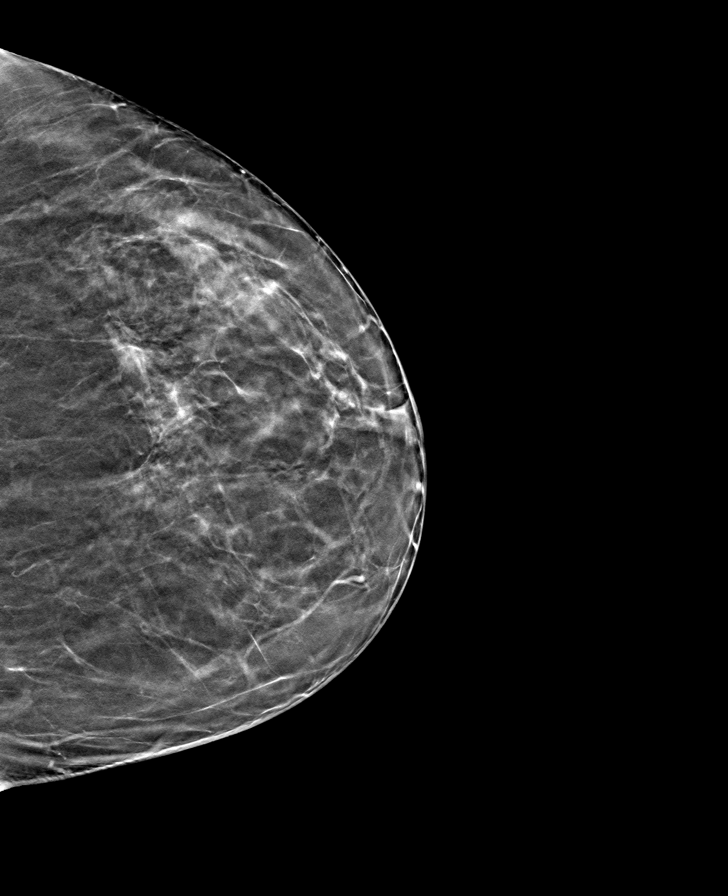

[8 of 24 positions shown; findings below may reference images not displayed]

ACR Breast Density Category b: There are scattered areas of
fibroglandular density.
FINDINGS: There are no findings suspicious for malignancy. Images were
processed with CAD.
IMPRESSION: No mammographic evidence of malignancy. A result letter of this
screening mammogram will be mailed directly to the patient.

RECOMMENDATION:
Screening mammogram in one year. (Code:[id number])

BI-RADS CATEGORY  1: Negative.

## 2021-03-01 ENCOUNTER — Ambulatory Visit: Payer: Medicare Other | Admitting: Neurology

## 2021-03-01 ENCOUNTER — Other Ambulatory Visit: Payer: Self-pay

## 2021-03-01 ENCOUNTER — Encounter: Payer: Self-pay | Admitting: Neurology

## 2021-03-01 VITALS — BP 133/70 | HR 78 | Ht 61.0 in | Wt 134.5 lb

## 2021-03-01 DIAGNOSIS — G309 Alzheimer's disease, unspecified: Secondary | ICD-10-CM | POA: Diagnosis not present

## 2021-03-01 DIAGNOSIS — F0280 Dementia in other diseases classified elsewhere without behavioral disturbance: Secondary | ICD-10-CM

## 2021-03-01 DIAGNOSIS — F418 Other specified anxiety disorders: Secondary | ICD-10-CM

## 2021-03-01 MED ORDER — DONEPEZIL HCL 5 MG PO TABS
5.0000 mg | ORAL_TABLET | Freq: Every day | ORAL | 3 refills | Status: DC
Start: 2021-03-01 — End: 2021-09-10

## 2021-03-01 MED ORDER — MEMANTINE HCL ER 21 MG PO CP24: 21.0000 mg | ORAL_CAPSULE | Freq: Every day | ORAL | 11 refills | Status: DC

## 2021-03-01 MED ORDER — MEMANTINE HCL ER 7 MG PO CP24: ORAL_CAPSULE | ORAL | 0 refills | Status: DC

## 2021-03-01 NOTE — Progress Notes (Signed)
[facility]  PATIENT: Lauren [name] DOB: 05-Sep-1940  REFERRING DOCTOR OR PCP:  [name] SOURCE: Patient, notes from primary care, lab results  _________________________________   HISTORICAL  CHIEF COMPLAINT:  Chief Complaint  Patient presents with   Follow-up    RM 1, w daughter Olivia Mackie. Here for Alzheimer f/u, pt reports doing well today.     HISTORY OF PRESENT ILLNESS:  Lauren [name] is a 80 y.o. woman with Alzheimer's disease.    Update 03/01/2021: She started donepezil and feels like her memory has improved.    Her daughter notes she fluctuates a lot.   Mood has improved on Lexapro.   She seems happier than before the medication.    She has no side effects.   She denies any new symptoms.   Gait is fine.   Bladder function is fine.     She states she is sleeping well and goes to bed around 930 pm and wakes up 630 am.-830 am   She does not wake up at night.    Her daughter notes, however, she sometimes is still in bed at noon .   Memory loss history: She started to experience cognitive issues in 2020.  She notes she misplaces items.    She is still driving.   She had one episode where she got lost driving.    She used [employer] which helped her get home.   She continues to do household ADLs like cooking but has more trouble with recipes.  Her daughter notices she has trouble with dates and forgets appointments.   She has trouble with names.     She has forgotten to turn the oven off and lock doors.  She started donepezil 5 mg 10/2020   Montreal Cognitive Assessment  03/01/2021 03/01/2021 10/30/2020  Visuospatial/ Executive (0/5) 1 0 2  Naming (0/3) 2 2 3   Attention: Read list of digits (0/2) 1 0 2  Attention: Read list of letters (0/1) 0 0 0  Attention: Serial 7 subtraction starting at 100 (0/3) 1 1 2   Language: Repeat phrase (0/2) 1 0 2  Language : Fluency (0/1) 0 0 0  Abstraction (0/2) 2 2 2   Delayed Recall (0/5) 0 0 0  Orientation (0/6) 4 2 6   Total  12 7 19   Adjusted Score (based on education) 12 - 19    TSH and B12 were normal  REVIEW OF SYSTEMS: Constitutional: No fevers, chills, sweats, or change in appetite Eyes: No visual changes, double vision, eye pain Ear, nose and throat: No hearing loss, ear pain, nasal congestion, sore throat Cardiovascular: No chest pain, palpitations Respiratory:  No shortness of breath at rest or with exertion.   No wheezes GastrointestinaI: No nausea, vomiting, diarrhea, abdominal pain, fecal incontinence Genitourinary:  No dysuria, urinary retention or frequency.  No nocturia. Musculoskeletal:  No neck pain, back pain Integumentary: No rash, pruritus, skin lesions Neurological: as above Psychiatric: Some depression and anxiety Endocrine: No palpitations, diaphoresis, change in appetite, change in weigh or increased thirst Hematologic/Lymphatic:  No anemia, purpura, petechiae. Allergic/Immunologic: No itchy/runny eyes, nasal congestion, recent allergic reactions, rashes  ALLERGIES: Allergies  Allergen Reactions   Gabapentin Other (See Comments)    Unable to sleep   Hydrochlorothiazide Other (See Comments)    Leg pain and aches   Hydrocodone-Guaifenesin Other (See Comments)    Cant sleep    Influenza Vaccines Other (See Comments)    Nausea and vomiting and stomach pain for 10 weeks  Mobic [Meloxicam] Swelling   Oxybutynin     Memory loss per patient   Sulfa Antibiotics Other (See Comments)    Cold sweats, passing out    Codeine Rash    Cold sweats     HOME MEDICATIONS:  Current Outpatient Medications:    amLODipine (NORVASC) 5 MG tablet, Take 1 tablet (5 mg total) by mouth daily. 1 tablet every other day, Disp: 90 tablet, Rfl: 2   Ascorbic Acid (VITAMIN C) 100 MG tablet, Take 100 mg by mouth daily., Disp: , Rfl:    escitalopram (LEXAPRO) 5 MG tablet, Take 1 tablet (5 mg total) by mouth daily., Disp: 90 tablet, Rfl: 3   fluticasone (FLONASE) 50 MCG/ACT nasal spray, SHAKE LIQUID AND  USE 2 SPRAYS IN EACH NOSTRIL DAILY, Disp: 48 g, Rfl: 0   latanoprost (XALATAN) 0.005 % ophthalmic solution, Place 1 drop into both eyes at bedtime. , Disp: , Rfl: 1   memantine (NAMENDA XR) 21 MG CP24 24 hr capsule, Take 1 capsule (21 mg total) by mouth daily., Disp: 30 capsule, Rfl: 11   memantine (NAMENDA XR) 7 MG CP24 24 hr capsule, One po qd for one week, then two po qd for one week, Disp: 21 capsule, Rfl: 0   NON FORMULARY, Vitamin B 12 Injections q month, Last injection 11/09/19, Disp: , Rfl:    donepezil (ARICEPT) 5 MG tablet, Take 1 tablet (5 mg total) by mouth at bedtime., Disp: 90 tablet, Rfl: 3  Current Facility-Administered Medications:    betamethasone acetate-betamethasone sodium phosphate (CELESTONE) injection 3 mg, 3 mg, Intramuscular, Once, Edrick Kins, DPM  PAST MEDICAL HISTORY: Past Medical History:  Diagnosis Date   Allergic rhinitis    Allergy    Cataract    removed by surgery   Cecal angiodysplasia 11/2019   Chickenpox    Elevated LDL cholesterol level 11/26/2015   no meds, diet controlled   Gallstones    removed by surgery   Glaucoma    HTN (hypertension)    Hx of adenomatous and sessile serrated colonic polyps 09/02/2018   Incontinence of urine in female 10/05/2015   Kidney stones    removed with gallbladder surgery per patient   Migraines    Urinary incontinence     PAST SURGICAL HISTORY: Past Surgical History:  Procedure Laterality Date   CHOLECYSTECTOMY  1995   COLONOSCOPY  08/27/2018   TA polyps/ [facility]   EYE SURGERY Bilateral    cataracts removed/laser   SACRAL NERVE STIMULATOR PLACEMENT  04/2018   To control overactive bladder   TONSILLECTOMY  1962   TUBAL LIGATION      FAMILY HISTORY: Family History  Problem Relation Age of Onset   Hematuria Father    Heart disease Father    Arthritis Father    Heart disease Mother    COPD Mother    Atrial fibrillation Mother    Hypertension Mother    Colon cancer Daughter    Rectal cancer Neg Hx     Stomach cancer Neg Hx    Esophageal cancer Neg Hx     SOCIAL HISTORY:  Social History   Socioeconomic History   Marital status: Widowed    Spouse name: Not on file   Number of children: 2   Years of education: Not on file   Highest education level: Not on file  Occupational History   Occupation: retired  Tobacco Use   Smoking status: Former    Pack years: 0.00    Types: Cigarettes  Quit date: 08/06/1993    Years since quitting: 27.5   Smokeless tobacco: Never  Vaping Use   Vaping Use: Never used  Substance and Sexual Activity   Alcohol use: No    Alcohol/week: 0.0 standard drinks   Drug use: No   Sexual activity: Not Currently    Birth control/protection: Post-menopausal  Other Topics Concern   Not on file  Social History Narrative   Right handed   Widowed   1 son and 1 daughter   She is a retired Radiation protection practitioner   Former smoker   No alcohol or caffeine or drugs or any tobacco   Social Determinants of Radio broadcast assistant Strain: Not on file  Food Insecurity: Not on file  Transportation Needs: Not on file  Physical Activity: Not on file  Stress: Not on file  Social Connections: Not on file  Intimate Partner Violence: Not on file     PHYSICAL EXAM  Vitals:   03/01/21 0815  BP: 133/70  Pulse: 78  Weight: 134 lb 8 oz (61 kg)  Height: 5\' 1"  (1.549 m)    Body mass index is 25.41 kg/m.   General: The patient is well-developed and well-nourished and in no acute distress  HEENT:  Head is [place]/AT.  Sclera are anicteric.    Neck:  The neck is nontender.  Skin: Extremities are without rash or  edema.  Neurologic Exam  Mental status: The patient is alert and oriented x 3 at the time of the examination.  She has markedly reduced short-term memory (0/5; 2/5 with category prompt) and reduced focus and attention (100-93-??; DLOW).   speech is normal.  Cranial nerves: Extraocular movements are full. Pupils are equal, round, and reactive to light and  accomodation.  Facial symmetry is present. There is good facial sensation to soft touch bilaterally.Facial strength is normal.  Trapezius and sternocleidomastoid strength is normal. No dysarthria is noted.    No obvious hearing deficits are noted.  Motor:  Muscle bulk is normal.   Tone is normal. Strength is  5 / 5 in all 4 extremities.   Sensory: Sensory testing is intact to pinprick, soft touch and vibration sensation in all 4 extremities.  Coordination: Cerebellar testing reveals good finger-nose-finger and heel-to-shin bilaterally.  Gait and station: Station is normal.   Gait is normal. Tandem gait is wide.  Romberg is negative.   Reflexes: Deep tendon reflexes are symmetric and normal bilaterally.       DIAGNOSTIC DATA (LABS, IMAGING, TESTING) - I reviewed patient records, labs, notes, testing and imaging myself where available.  Lab Results  Component Value Date   WBC 4.4 10/24/2020   HGB 13.4 10/24/2020   HCT 39.1 10/24/2020   MCV 87.2 10/24/2020   PLT 247.0 10/24/2020      Component Value Date/Time   NA 141 12/06/2020 1541   K 3.9 12/06/2020 1541   CL 102 12/06/2020 1541   CO2 32 12/06/2020 1541   GLUCOSE 134 (H) 12/06/2020 1541   BUN 16 12/06/2020 1541   CREATININE 1.03 12/06/2020 1541   CALCIUM 9.4 12/06/2020 1541   PROT 6.7 09/27/2020 1429   ALBUMIN 4.3 09/27/2020 1429   AST 18 09/27/2020 1429   ALT 11 09/27/2020 1429   ALKPHOS 49 09/27/2020 1429   BILITOT 0.5 09/27/2020 1429   Lab Results  Component Value Date   CHOL 200 05/19/2020   HDL 81.70 05/19/2020   LDLCALC 102 (H) 05/19/2020   LDLDIRECT 103.0 04/13/2018  TRIG 80.0 05/19/2020   CHOLHDL 2 05/19/2020   No results found for: HGBA1C Lab Results  Component Value Date   VITAMINB12 368 12/06/2020   Lab Results  Component Value Date   TSH 0.90 10/24/2020       ASSESSMENT AND PLAN  Alzheimer disease ([place])  Depression with anxiety    Increase donepezil to 10 mg.   Add Namenda and  titrate to 21 mg daily  Ok for Driving short distances in good weather/daytime 3.    Continue SSRI 4.    We discussed current living arrangement.   Consideration of memory unit/assistive living.  I provided an FL2 form. 5.    Rtc 6 months or sooner if new or worsening issues  45-minute office visit with the majority of the time spent face-to-face for history and physical, discussion/counseling and decision-making.  Additional time with record review and documentation.   Makai Dumond A. Felecia Shelling, MD, [facility] 10/29/2479, 8:59 PM Certified in Neurology, Clinical Neurophysiology, Sleep Medicine and Neuroimaging  [facility] [address] [place], [place] [address] [phone]

## 2021-03-27 ENCOUNTER — Ambulatory Visit (INDEPENDENT_AMBULATORY_CARE_PROVIDER_SITE_OTHER): Payer: Medicare Other | Admitting: Family Medicine

## 2021-03-27 ENCOUNTER — Other Ambulatory Visit: Payer: Self-pay

## 2021-03-27 ENCOUNTER — Encounter: Payer: Self-pay | Admitting: Family Medicine

## 2021-03-27 VITALS — BP 130/60 | HR 80 | Temp 98.2°F | Ht 61.0 in | Wt 133.8 lb

## 2021-03-27 DIAGNOSIS — E538 Deficiency of other specified B group vitamins: Secondary | ICD-10-CM

## 2021-03-27 DIAGNOSIS — I10 Essential (primary) hypertension: Secondary | ICD-10-CM

## 2021-03-27 DIAGNOSIS — F418 Other specified anxiety disorders: Secondary | ICD-10-CM | POA: Diagnosis not present

## 2021-03-27 DIAGNOSIS — G309 Alzheimer's disease, unspecified: Secondary | ICD-10-CM

## 2021-03-27 DIAGNOSIS — F0280 Dementia in other diseases classified elsewhere without behavioral disturbance: Secondary | ICD-10-CM

## 2021-03-27 MED ORDER — CYANOCOBALAMIN 1000 MCG/ML IJ SOLN
1000.0000 ug | Freq: Once | INTRAMUSCULAR | Status: AC
  Administered 2021-03-27: 1000 ug via INTRAMUSCULAR

## 2021-03-27 NOTE — Assessment & Plan Note (Signed)
Chronic issue.  Seems to be generally stable.  She will continue on Aricept.  I reviewed the ramp-up instructions for the Namenda and asked that her son confirm that she has been taking this as prescribed.

## 2021-03-27 NOTE — Assessment & Plan Note (Signed)
Asymptomatic.  She will continue on Lexapro 5 mg once daily.

## 2021-03-27 NOTE — Assessment & Plan Note (Signed)
Adequate control for age.  She will continue amlodipine 5 mg once daily. 

## 2021-03-27 NOTE — Progress Notes (Signed)
Tommi Rumps, MD Phone: [phone]  Lauren [name] is a 80 y.o. female who presents today for f/u.  Hypertension: Typically [id number] systolically.  Taking amlodipine.  No chest pain, shortness of breath, or edema.  Dementia: Patient has been on Aricept and recently started on Namenda.  Her son notes some improvement in how she is doing though she does still have some progressive memory decline.  She is still driving though is not going on the interstate and is only taking short trips.  She has not been getting lost.  Depression: Patient notes no depression or anxiety.  She is on Lexapro.  Social History   Tobacco Use  Smoking Status Former   Types: Cigarettes   Quit date: 08/06/1993   Years since quitting: 27.6  Smokeless Tobacco Never    Current Outpatient Medications on File Prior to Visit  Medication Sig Dispense Refill   amLODipine (NORVASC) 5 MG tablet Take 1 tablet (5 mg total) by mouth daily. 1 tablet every other day 90 tablet 2   Ascorbic Acid (VITAMIN C) 100 MG tablet Take 100 mg by mouth daily.     donepezil (ARICEPT) 5 MG tablet Take 1 tablet (5 mg total) by mouth at bedtime. 90 tablet 3   escitalopram (LEXAPRO) 5 MG tablet Take 1 tablet (5 mg total) by mouth daily. 90 tablet 3   fluticasone (FLONASE) 50 MCG/ACT nasal spray SHAKE LIQUID AND USE 2 SPRAYS IN EACH NOSTRIL DAILY 48 g 0   latanoprost (XALATAN) 0.005 % ophthalmic solution Place 1 drop into both eyes at bedtime.   1   memantine (NAMENDA XR) 21 MG CP24 24 hr capsule Take 1 capsule (21 mg total) by mouth daily. 30 capsule 11   memantine (NAMENDA XR) 7 MG CP24 24 hr capsule One po qd for one week, then two po qd for one week 21 capsule 0   NON FORMULARY Vitamin B 12 Injections q month, Last injection 11/09/19     Current Facility-Administered Medications on File Prior to Visit  Medication Dose Route Frequency Provider Last Rate Last Admin   betamethasone acetate-betamethasone sodium phosphate (CELESTONE)  injection 3 mg  3 mg Intramuscular Once Edrick Kins, DPM         ROS see history of present illness  Objective  Physical Exam Vitals:   03/27/21 0857  BP: 130/60  Pulse: 80  Temp: 98.2 F (36.8 C)  SpO2: 96%    BP Readings from Last 3 Encounters:  03/27/21 130/60  03/01/21 133/70  01/19/21 128/62   Wt Readings from Last 3 Encounters:  03/27/21 133 lb 12.8 oz (60.7 kg)  03/01/21 134 lb 8 oz (61 kg)  01/19/21 142 lb (64.4 kg)    Physical Exam Constitutional:      General: She is not in acute distress.    Appearance: She is not diaphoretic.  Cardiovascular:     Rate and Rhythm: Normal rate and regular rhythm.     Heart sounds: Normal heart sounds.  Pulmonary:     Effort: Pulmonary effort is normal.     Breath sounds: Normal breath sounds.  Skin:    General: Skin is warm and dry.  Neurological:     Mental Status: She is alert.     Assessment/Plan: Please see individual problem list.  Problem List Items Addressed This Visit     Alzheimer disease ([place])    Chronic issue.  Seems to be generally stable.  She will continue on Aricept.  I reviewed the  ramp-up instructions for the Namenda and asked that her son confirm that she has been taking this as prescribed.       Depression with anxiety    Asymptomatic.  She will continue on Lexapro 5 mg once daily.       HTN (hypertension) - Primary    Adequate control for age.  She will continue amlodipine 5 mg once daily.       Relevant Orders   Basic Metabolic Panel (BMET)   Other Visit Diagnoses     B12 deficiency       Relevant Medications   cyanocobalamin ((VITAMIN B-12)) injection 1,000 mcg (Completed)         Return in about 6 months (around 09/27/2021) for Hypertension/memory.  This visit occurred during the SARS-CoV-2 public health emergency.  Safety protocols were in place, including screening questions prior to the visit, additional usage of staff PPE, and extensive cleaning of exam room while  observing appropriate contact time as indicated for disinfecting solutions.    Tommi Rumps, MD [place]

## 2021-03-27 NOTE — Patient Instructions (Addendum)
Nice to see you. We will get a get some lab work today. Please continue with the Namenda and Aricept. Please make sure you have tapered up on the dose of the Namenda.

## 2021-03-28 ENCOUNTER — Telehealth: Payer: Self-pay | Admitting: Neurology

## 2021-03-28 NOTE — Telephone Encounter (Signed)
Called daughter Tressia Miners. They have appt today at 2pm w/ memory care unit. Requesting FL2 form to be filled out for SNF/memory care. Previous one filled out was for in home care. She needs help w/ medication management, not eating regularly/losing weight, needs prompts to shower, ect. Either herself or bother, Jawaher Bree will pick up form by 4pm today. She is going out of town next week. She may not depending on what she needs to do to help w/ her mother.

## 2021-03-28 NOTE — Telephone Encounter (Signed)
Placed completed/signed form up front for pick up.

## 2021-03-28 NOTE — Telephone Encounter (Signed)
Pt's daughter Tressia Miners called stating she is needing an FL 2 form completed for assisted living. Tressia Miners says her mother drive herself to [place] last night and they had to go get her, also she says her mother took 4 days of her medication in one day and didn't remember taking them. Tressia Miners thinks an assisted living will be very beneficial, she is requesting a call back.

## 2021-04-02 ENCOUNTER — Telehealth: Payer: Self-pay | Admitting: Family Medicine

## 2021-04-02 DIAGNOSIS — I10 Essential (primary) hypertension: Secondary | ICD-10-CM

## 2021-04-02 NOTE — Telephone Encounter (Signed)
This patient was supposed to have labs at her last visit though it looks like they were not completed.  Can we get her scheduled for a lab appointment for a BMP?  Orders placed.

## 2021-04-02 NOTE — Telephone Encounter (Signed)
I called and spoke with the patients daughter and they will check the calendar and call back to schedule a lab appointment for the patient.  Dashanti Burr,cma

## 2021-04-10 ENCOUNTER — Other Ambulatory Visit: Payer: Self-pay

## 2021-04-10 ENCOUNTER — Other Ambulatory Visit (INDEPENDENT_AMBULATORY_CARE_PROVIDER_SITE_OTHER): Payer: Medicare Other

## 2021-04-10 DIAGNOSIS — I10 Essential (primary) hypertension: Secondary | ICD-10-CM | POA: Diagnosis not present

## 2021-04-10 LAB — BASIC METABOLIC PANEL
BUN: 24 mg/dL — ABNORMAL HIGH (ref 6–23)
CO2: 31 mEq/L (ref 19–32)
Calcium: 9.1 mg/dL (ref 8.4–10.5)
Chloride: 106 mEq/L (ref 96–112)
Creatinine, Ser: 1.11 mg/dL (ref 0.40–1.20)
GFR: 47.67 mL/min — ABNORMAL LOW (ref >60.00)
Glucose, Bld: 93 mg/dL (ref 70–99)
Potassium: 4.1 mEq/L (ref 3.5–5.1)
Sodium: 145 mEq/L (ref 135–145)

## 2021-04-15 ENCOUNTER — Other Ambulatory Visit: Payer: Self-pay | Admitting: Family Medicine

## 2021-04-15 DIAGNOSIS — N179 Acute kidney failure, unspecified: Secondary | ICD-10-CM

## 2021-04-16 ENCOUNTER — Telehealth: Payer: Self-pay | Admitting: *Deleted

## 2021-04-16 NOTE — Telephone Encounter (Signed)
Left message to call office patient needs lab appointment to recheck labs orders placed.

## 2021-04-16 NOTE — Telephone Encounter (Signed)
-----   Message from Leone Haven, MD sent at 04/15/2021 11:03 AM EDT ----- Noted.  Lets recheck her labs at the end of this week after she has had a chance to adequately hydrate.  Order placed.

## 2021-04-20 ENCOUNTER — Other Ambulatory Visit (INDEPENDENT_AMBULATORY_CARE_PROVIDER_SITE_OTHER): Payer: Medicare Other

## 2021-04-20 ENCOUNTER — Other Ambulatory Visit: Payer: Medicare Other

## 2021-04-20 ENCOUNTER — Other Ambulatory Visit: Payer: Self-pay

## 2021-04-20 DIAGNOSIS — N179 Acute kidney failure, unspecified: Secondary | ICD-10-CM | POA: Diagnosis not present

## 2021-04-20 LAB — BASIC METABOLIC PANEL
BUN: 26 mg/dL — ABNORMAL HIGH (ref 6–23)
CO2: 30 mEq/L (ref 19–32)
Calcium: 9.2 mg/dL (ref 8.4–10.5)
Chloride: 105 mEq/L (ref 96–112)
Creatinine, Ser: 1.11 mg/dL (ref 0.40–1.20)
GFR: 47.66 mL/min — ABNORMAL LOW (ref >60.00)
Glucose, Bld: 80 mg/dL (ref 70–99)
Potassium: 4.2 mEq/L (ref 3.5–5.1)
Sodium: 142 mEq/L (ref 135–145)

## 2021-04-21 ENCOUNTER — Other Ambulatory Visit: Payer: Self-pay | Admitting: Family Medicine

## 2021-04-21 DIAGNOSIS — N1831 Chronic kidney disease, stage 3a: Secondary | ICD-10-CM

## 2021-05-22 ENCOUNTER — Telehealth: Payer: Self-pay | Admitting: Family Medicine

## 2021-05-22 NOTE — Telephone Encounter (Signed)
Filed authorization [employer] portal. Due 06/02/2021

## 2021-05-31 ENCOUNTER — Emergency Department (HOSPITAL_BASED_OUTPATIENT_CLINIC_OR_DEPARTMENT_OTHER): Payer: Medicare Other

## 2021-05-31 ENCOUNTER — Other Ambulatory Visit: Payer: Self-pay

## 2021-05-31 ENCOUNTER — Emergency Department (HOSPITAL_BASED_OUTPATIENT_CLINIC_OR_DEPARTMENT_OTHER)
Admission: EM | Admit: 2021-05-31 | Discharge: 2021-05-31 | Disposition: A | Discharge status: Home / Self Care | Payer: Medicare Other | Attending: Emergency Medicine | Admitting: Emergency Medicine

## 2021-05-31 ENCOUNTER — Encounter (HOSPITAL_BASED_OUTPATIENT_CLINIC_OR_DEPARTMENT_OTHER): Payer: Self-pay | Admitting: Emergency Medicine

## 2021-05-31 DIAGNOSIS — Z79899 Other long term (current) drug therapy: Secondary | ICD-10-CM | POA: Insufficient documentation

## 2021-05-31 DIAGNOSIS — I10 Essential (primary) hypertension: Secondary | ICD-10-CM | POA: Insufficient documentation

## 2021-05-31 DIAGNOSIS — Z87891 Personal history of nicotine dependence: Secondary | ICD-10-CM | POA: Diagnosis not present

## 2021-05-31 DIAGNOSIS — Z20822 Contact with and (suspected) exposure to covid-19: Secondary | ICD-10-CM | POA: Insufficient documentation

## 2021-05-31 DIAGNOSIS — R059 Cough, unspecified: Secondary | ICD-10-CM | POA: Diagnosis not present

## 2021-05-31 DIAGNOSIS — R0981 Nasal congestion: Secondary | ICD-10-CM | POA: Diagnosis not present

## 2021-05-31 LAB — COMPREHENSIVE METABOLIC PANEL
ALT: 14 U/L (ref 0–44)
AST: 15 U/L (ref 15–41)
Albumin: 3.8 g/dL (ref 3.5–5.0)
Alkaline Phosphatase: 45 U/L (ref 38–126)
Anion gap: 10 (ref 5–15)
BUN: 16 mg/dL (ref 8–23)
CO2: 30 mmol/L (ref 22–32)
Calcium: 8.7 mg/dL — ABNORMAL LOW (ref 8.9–10.3)
Chloride: 100 mmol/L (ref 98–111)
Creatinine, Ser: 0.9 mg/dL (ref 0.44–1.00)
GFR, Estimated: >60 mL/min (ref >60)
Glucose, Bld: 102 mg/dL — ABNORMAL HIGH (ref 70–99)
Potassium: 3.1 mmol/L — ABNORMAL LOW (ref 3.5–5.1)
Sodium: 140 mmol/L (ref 135–145)
Total Bilirubin: 0.4 mg/dL (ref 0.3–1.2)
Total Protein: 6.4 g/dL — ABNORMAL LOW (ref 6.5–8.1)

## 2021-05-31 LAB — CBC WITH DIFFERENTIAL/PLATELET
Abs Immature Granulocytes: 0.02 10*3/uL (ref 0.00–0.07)
Basophils Absolute: 0.1 10*3/uL (ref 0.0–0.1)
Basophils Relative: 1 %
Eosinophils Absolute: 0.1 10*3/uL (ref 0.0–0.5)
Eosinophils Relative: 2 %
HCT: 36 % (ref 36.0–46.0)
Hemoglobin: 11.9 g/dL — ABNORMAL LOW (ref 12.0–15.0)
Immature Granulocytes: 0 %
Lymphocytes Relative: 19 %
Lymphs Abs: 1.3 10*3/uL (ref 0.7–4.0)
MCH: 29.6 pg (ref 26.0–34.0)
MCHC: 33.1 g/dL (ref 30.0–36.0)
MCV: 89.6 fL (ref 80.0–100.0)
Monocytes Absolute: 0.8 10*3/uL (ref 0.1–1.0)
Monocytes Relative: 12 %
Neutro Abs: 4.4 10*3/uL (ref 1.7–7.7)
Neutrophils Relative %: 66 %
Platelets: 238 10*3/uL (ref 150–400)
RBC: 4.02 MIL/uL (ref 3.87–5.11)
RDW: 12.2 % (ref 11.5–15.5)
WBC: 6.7 10*3/uL (ref 4.0–10.5)
nRBC: 0 % (ref 0.0–0.2)

## 2021-05-31 LAB — RESP PANEL BY RT-PCR (FLU A&B, COVID) ARPGX2
Influenza A by PCR: NEGATIVE
Influenza B by PCR: NEGATIVE
SARS Coronavirus 2 by RT PCR: NEGATIVE

## 2021-05-31 LAB — URINALYSIS, ROUTINE W REFLEX MICROSCOPIC
Bilirubin Urine: NEGATIVE
Glucose, UA: NEGATIVE mg/dL
Hgb urine dipstick: NEGATIVE
Ketones, ur: NEGATIVE mg/dL
Leukocytes,Ua: NEGATIVE
Nitrite: NEGATIVE
Specific Gravity, Urine: 1.021 (ref 1.005–1.030)
pH: 5.5 (ref 5.0–8.0)

## 2021-05-31 MED ORDER — BENZONATATE 100 MG PO CAPS: 100.0000 mg | ORAL_CAPSULE | Freq: Three times a day (TID) | ORAL | 0 refills | Status: DC

## 2021-05-31 MED ORDER — POTASSIUM CHLORIDE CRYS ER 20 MEQ PO TBCR
40.0000 meq | EXTENDED_RELEASE_TABLET | Freq: Once | ORAL | Status: AC
  Administered 2021-05-31: 40 meq via ORAL
  Filled 2021-05-31: qty 2

## 2021-05-31 NOTE — ED Triage Notes (Signed)
Per daughter pt had dry cough x 5 days. Denies chest pain. Per daughter some shortness of breath . Sleepier x 4 days . Ambulatory to room with steady gait. Alert and oriented x3.  Dx of Alzheimer.

## 2021-05-31 NOTE — ED Notes (Signed)
RT Note: Pt. seen with daughter at bedside who is here for cough, pt. stated, "when she coughs there is some yellow mucus", no hx., COPD, oxygen use, or meds, b/l b.s. diminished t/o with no wheeze heard.

## 2021-05-31 NOTE — ED Provider Notes (Signed)
[facility] EMERGENCY DEPT Provider Note   CSN: [id number] Arrival date & time: 05/31/21  1426     History Chief Complaint  Patient presents with   Cough    Lauren [name] is a 80 y.o. female.  The history is provided by the patient, a relative and medical records. No language interpreter was used.  Cough   80 year old female with hx of [place] presents to the ED accompanied by daughter for evaluation of cold symptoms.  Per daughter, for the past 5 days patient has had congestion, nonproductive cough and daughter also concerned that patient is not behaving her usual self.  States patient is typically sleeping 20 out of 24 hours in a day.  She is eating and drinking less.  She has a bladder implant but has had some leakage.  Patient does endorse having some congestion and states that she thinks she has a cold.  Otherwise she states she feels fine, she denies feeling depressed.  She did not complain of any fever chills shortness of breath chest pain abdominal pain no trouble urinating.  No recent medication changes.  She has been fully vaccinated for COVID-19.  She was at home and does have a caregiver that stays with her for most of the day.   Past Medical History:  Diagnosis Date   Allergic rhinitis    Allergy    Cataract    removed by surgery   Cecal angiodysplasia 11/2019   Chickenpox    Elevated LDL cholesterol level 11/26/2015   no meds, diet controlled   Gallstones    removed by surgery   Glaucoma    HTN (hypertension)    Hx of adenomatous and sessile serrated colonic polyps 09/02/2018   Incontinence of urine in female 10/05/2015   Kidney stones    removed with gallbladder surgery per patient   Migraines    Urinary incontinence     Patient Active Problem List   Diagnosis Date Noted   Alzheimer disease ([place]) 10/30/2020   Depression with anxiety 10/30/2020   Left rotator cuff tear 02/08/2020   Left shoulder pain 01/19/2020   Papule of skin 01/19/2020    Glaucoma 01/19/2020   Allergic rhinitis 11/15/2019   Eustachian tube dysfunction, bilateral 05/22/2019   Family history of colon cancer 05/22/2019   Other bursal cyst, left ankle and foot 10/27/2018   Hx of adenomatous and sessile serrated colonic polyps 09/02/2018   Arthritis of midfoot 05/28/2018   Status post cataract extraction 04/13/2018   Overweight 03/04/2018   Low serum vitamin B12 10/13/2017   Morton's neuroma of third interspace of left foot 09/22/2017   Loss of transverse plantar arch of left foot 09/22/2017   Pain and swelling of left lower leg 08/20/2017   Memory deficit 07/02/2017   HTN (hypertension) 07/02/2017   Dry skin 08/31/2016   History of shingles 08/31/2016   Left foot pain 05/24/2016   Osteoporosis 02/21/2016   Elevated LDL cholesterol level 11/26/2015   Incontinence of urine in female 10/05/2015    Past Surgical History:  Procedure Laterality Date   CHOLECYSTECTOMY  1995   COLONOSCOPY  08/27/2018   TA polyps/ [facility]   EYE SURGERY Bilateral    cataracts removed/laser   SACRAL NERVE STIMULATOR PLACEMENT  04/2018   To control overactive bladder   TONSILLECTOMY  1962   TUBAL LIGATION       OB History   No obstetric history on file.     Family History  Problem Relation  Age of Onset   Hematuria Father    Heart disease Father    Arthritis Father    Heart disease Mother    COPD Mother    Atrial fibrillation Mother    Hypertension Mother    Colon cancer Daughter    Rectal cancer Neg Hx    Stomach cancer Neg Hx    Esophageal cancer Neg Hx     Social History   Tobacco Use   Smoking status: Former    Types: Cigarettes    Quit date: 08/06/1993    Years since quitting: 27.8   Smokeless tobacco: Never  Vaping Use   Vaping Use: Never used  Substance Use Topics   Alcohol use: No    Alcohol/week: 0.0 standard drinks   Drug use: No    Home Medications Prior to Admission medications   Medication Sig Start Date End Date Taking?  Authorizing Provider  amLODipine (NORVASC) 5 MG tablet Take 1 tablet (5 mg total) by mouth daily. 1 tablet every other day 04/07/20   Leone Haven, MD  Ascorbic Acid (VITAMIN C) 100 MG tablet Take 100 mg by mouth daily.    [provider]  donepezil (ARICEPT) 5 MG tablet Take 1 tablet (5 mg total) by mouth at bedtime. 03/01/21   Sater, Nanine Means, MD  escitalopram (LEXAPRO) 5 MG tablet Take 1 tablet (5 mg total) by mouth daily. 10/30/20   Sater, Nanine Means, MD  fluticasone (FLONASE) 50 MCG/ACT nasal spray SHAKE LIQUID AND USE 2 SPRAYS IN EACH NOSTRIL DAILY 11/20/20   Leone Haven, MD  latanoprost (XALATAN) 0.005 % ophthalmic solution Place 1 drop into both eyes at bedtime.  10/23/17   [provider]  memantine (NAMENDA XR) 21 MG CP24 24 hr capsule Take 1 capsule (21 mg total) by mouth daily. 03/01/21   Sater, Nanine Means, MD  memantine (NAMENDA XR) 7 MG CP24 24 hr capsule One po qd for one week, then two po qd for one week 03/01/21   Sater, Nanine Means, MD  NON FORMULARY Vitamin B 12 Injections q month, Last injection 11/09/19    [provider]    Allergies    Gabapentin, Hydrochlorothiazide, Hydrocodone-guaifenesin, Influenza vaccines, Mobic [meloxicam], Oxybutynin, Sulfa antibiotics, and Codeine  Review of Systems   Review of Systems  Respiratory:  Positive for cough.   All other systems reviewed and are negative.  Physical Exam Updated Vital Signs BP (!) 143/64 (BP Location: Right Arm)   Pulse 84   Temp 98.4 F (36.9 C) (Oral)   Resp 20   Ht 5\' 4"  (1.626 m)   Wt 61.2 kg   LMP  (LMP Unknown)   SpO2 97%   BMI 23.17 kg/m   Physical Exam Vitals and nursing note reviewed.  Constitutional:      General: She is not in acute distress.    Appearance: She is well-developed.     Comments: Elderly female well appearing in no acute distress  HENT:     Head: Atraumatic.  Eyes:     Conjunctiva/sclera: Conjunctivae normal.  Cardiovascular:     Rate and Rhythm:  Normal rate and regular rhythm.     Pulses: Normal pulses.     Heart sounds: Normal heart sounds.  Pulmonary:     Effort: Pulmonary effort is normal.     Breath sounds: No wheezing, rhonchi or rales.  Abdominal:     Palpations: Abdomen is soft.     Tenderness: There is no abdominal tenderness.  Musculoskeletal:     Cervical back: Neck supple.     Right lower leg: No edema.     Left lower leg: No edema.  Skin:    Findings: No rash.  Neurological:     Mental Status: She is alert. Mental status is at baseline.     Comments: Alert and oriented x3 Equal strength in all 4 extremities  Psychiatric:        Mood and Affect: Mood normal.    ED Results / Procedures / Treatments   Labs (all labs ordered are listed, but only abnormal results are displayed) Labs Reviewed  COMPREHENSIVE METABOLIC PANEL - Abnormal; Notable for the following components:      Result Value   Potassium 3.1 (*)    Glucose, Bld 102 (*)    Calcium 8.7 (*)    Total Protein 6.4 (*)    All other components within normal limits  CBC WITH DIFFERENTIAL/PLATELET - Abnormal; Notable for the following components:   Hemoglobin 11.9 (*)    All other components within normal limits  URINALYSIS, ROUTINE W REFLEX MICROSCOPIC - Abnormal; Notable for the following components:   Protein, ur TRACE (*)    All other components within normal limits  RESP PANEL BY RT-PCR (FLU A&B, COVID) [id number]    EKG None  Radiology DG Chest Portable 1 View  Result Date: 05/31/2021 CLINICAL DATA:  Cough over the last 5 days. EXAM: PORTABLE CHEST 1 VIEW COMPARISON:  None. FINDINGS: Heart size is normal. There is chronic aortic atherosclerotic calcification. The lungs are clear. No infiltrate, collapse or effusion. No edema. No regional bone abnormality. IMPRESSION: No active disease.  Aortic atherosclerosis. Electronically Signed   By: Nelson Chimes M.D.   On: 05/31/2021 15:41    Procedures Procedures   Medications Ordered in ED Medications   potassium chloride SA (KLOR-CON) CR tablet 40 mEq (40 mEq Oral Given 05/31/21 1652)    ED Course  I have reviewed the triage vital signs and the nursing notes.  Pertinent labs & imaging results that were available during my care of the patient were reviewed by me and considered in my medical decision making (see chart for details).    MDM Rules/Calculators/A&P                           BP (!) 177/65   Pulse 91   Temp 98.4 F (36.9 C) (Oral)   Resp 20   Ht 5\' 4"  (1.626 m)   Wt 61.2 kg   LMP  (LMP Unknown)   SpO2 98%   BMI 23.17 kg/m   Final Clinical Impression(s) / ED Diagnoses Final diagnoses:  Sinus congestion    Rx / DC Orders ED Discharge Orders          Ordered    benzonatate (TESSALON) 100 MG capsule  Every 8 hours        05/31/21 1653           3:36 PM Daughter brought patient here due to having some cold symptoms as well as concerns for failure to thrive as patient is sleeping more and eating less.  Patient denies having any active symptoms aside from congestion.  Patient is overall well-appearing.  Work-up initiated.  4:48 PM Patient's labs are reassuring.  Potassium is 3.1, will give supplementation but otherwise chest x-ray and UA without signs of infection.  Patient is hypertensive but does not exhibit any symptoms concerning for hypertensive emergency.  She will need to have her blood pressure rechecked by her PCP.  At this time, patient is stable for discharge.  Return precaution given.   Domenic Moras, PA-C 05/31/21 1839    Truddie Hidden, MD 06/01/21 [phone]

## 2021-05-31 NOTE — Discharge Instructions (Addendum)
You have been evaluated for your symptoms.  Fortunately no evidence of concerning infection were noted today.  Your potassium level was replenished in the ED however, please eat a banana daily for the next week and have your potassium rechecked.  Take Tessalon as prescribed for cough and congestion.  Follow-up with your primary care doctor for further care.

## 2021-06-05 ENCOUNTER — Telehealth: Payer: Self-pay

## 2021-06-05 ENCOUNTER — Encounter: Payer: Self-pay | Admitting: Family Medicine

## 2021-06-05 ENCOUNTER — Ambulatory Visit: Payer: Medicare Other | Admitting: Family Medicine

## 2021-06-05 ENCOUNTER — Encounter (HOSPITAL_BASED_OUTPATIENT_CLINIC_OR_DEPARTMENT_OTHER): Payer: Self-pay | Admitting: Emergency Medicine

## 2021-06-05 ENCOUNTER — Emergency Department (HOSPITAL_BASED_OUTPATIENT_CLINIC_OR_DEPARTMENT_OTHER)
Admission: EM | Admit: 2021-06-05 | Discharge: 2021-06-05 | Disposition: A | Discharge status: Home / Self Care | Payer: Medicare Other | Attending: Emergency Medicine | Admitting: Emergency Medicine

## 2021-06-05 ENCOUNTER — Other Ambulatory Visit: Payer: Self-pay

## 2021-06-05 ENCOUNTER — Emergency Department (HOSPITAL_BASED_OUTPATIENT_CLINIC_OR_DEPARTMENT_OTHER): Payer: Medicare Other | Admitting: Radiology

## 2021-06-05 DIAGNOSIS — R0981 Nasal congestion: Secondary | ICD-10-CM | POA: Diagnosis not present

## 2021-06-05 DIAGNOSIS — E876 Hypokalemia: Secondary | ICD-10-CM | POA: Insufficient documentation

## 2021-06-05 DIAGNOSIS — Z79899 Other long term (current) drug therapy: Secondary | ICD-10-CM | POA: Diagnosis not present

## 2021-06-05 DIAGNOSIS — R059 Cough, unspecified: Secondary | ICD-10-CM | POA: Insufficient documentation

## 2021-06-05 DIAGNOSIS — G309 Alzheimer's disease, unspecified: Secondary | ICD-10-CM | POA: Insufficient documentation

## 2021-06-05 DIAGNOSIS — Z20822 Contact with and (suspected) exposure to covid-19: Secondary | ICD-10-CM | POA: Diagnosis not present

## 2021-06-05 DIAGNOSIS — I10 Essential (primary) hypertension: Secondary | ICD-10-CM | POA: Insufficient documentation

## 2021-06-05 DIAGNOSIS — Z87891 Personal history of nicotine dependence: Secondary | ICD-10-CM | POA: Diagnosis not present

## 2021-06-05 DIAGNOSIS — J189 Pneumonia, unspecified organism: Secondary | ICD-10-CM

## 2021-06-05 LAB — RESP PANEL BY RT-PCR (FLU A&B, COVID) ARPGX2
Influenza A by PCR: NEGATIVE
Influenza B by PCR: NEGATIVE
SARS Coronavirus 2 by RT PCR: NEGATIVE

## 2021-06-05 MED ORDER — AZITHROMYCIN 250 MG PO TABS: 250.0000 mg | ORAL_TABLET | Freq: Every day | ORAL | 0 refills | Status: DC

## 2021-06-05 MED ORDER — AMOXICILLIN-POT CLAVULANATE 875-125 MG PO TABS: 1.0000 | ORAL_TABLET | Freq: Two times a day (BID) | ORAL | 0 refills | Status: DC

## 2021-06-05 NOTE — Telephone Encounter (Signed)
Patients daughter was instructed to take patient to ED to be evaluated by access nurse. Has not gotten to ED as of yet.

## 2021-06-05 NOTE — Discharge Instructions (Addendum)
Take azithromycin twice the first day.  After that take 1 pill until completing the bottle.  Take augmentin twice daily for the next 7 days.  You can take these at the same time, they should not interact with any of your medicine.  The Augmentin can cause diarrhea, advised take it with food and water when possible.  Your chest x-ray did show an abnormality on the CT scan.  While we do not know what this is at this time, does require additional imaging in the outpatient setting.  Please follow-up with your primary care doctor to discuss the results as well as follow-up plan.  1.0 cm nodular opacity projecting over the right midlung, for  which nonemergent chest CT is recommended for better evaluation.

## 2021-06-05 NOTE — ED Notes (Signed)
Patient verbalizes understanding of discharge instructions. Opportunity for questioning and answers were provided. Patient discharged from ED.  °

## 2021-06-05 NOTE — Telephone Encounter (Signed)
Noted.  Please follow-up with them to make sure she is getting evaluated.  If she does not want to go to the ED she could go to an urgent care.

## 2021-06-05 NOTE — Telephone Encounter (Signed)
Patient is currently being evaluated.

## 2021-06-05 NOTE — ED Provider Notes (Signed)
[facility] EMERGENCY DEPT Provider Note   CSN: [id number] Arrival date & time: 06/05/21  1043     History Chief Complaint  Patient presents with  . Cough    Lauren [name] is a 80 y.o. female.  HPI  Patient reports with cough x2 weeks.  Coughing is constant the other day, worse when she lays down flat.  Denies any hemoptysis, denies any shortness of breath or chest pain.  She has tried the [employer] for the coughing which have helped a little bit.  Reports that she has been feeling fatigued as well for the last few weeks.  Also has associated congestion.  Patient was seen for the same on 05/31/2021.  She was given benzoate for coughing, tested negative for COVID and had an unremarkable laboratory work-up at that time.    Past Medical History:  Diagnosis Date  . Allergic rhinitis   . Allergy   . Cataract    removed by surgery  . 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 Chickenpox   . Elevated LDL cholesterol level 11/26/2015   no meds, diet controlled  . Gallstones    removed by surgery  . Glaucoma   . HTN (hypertension)   . Hx of adenomatous and sessile serrated colonic polyps 09/02/2018  . Incontinence of urine in female 10/05/2015  . Kidney stones    removed with gallbladder surgery per patient  . Migraines   . Urinary incontinence     Patient Active Problem List   Diagnosis Date Noted  . Alzheimer disease ([place]) 10/30/2020  . Depression with anxiety 10/30/2020  . Left rotator cuff tear 02/08/2020  . Left shoulder pain 01/19/2020  . Papule of skin 01/19/2020  . Glaucoma 01/19/2020  . Allergic rhinitis 11/15/2019  . Eustachian tube dysfunction, bilateral 05/22/2019  . Family history of colon cancer 05/22/2019  . Other bursal cyst, left ankle and foot 10/27/2018  . Hx of adenomatous and sessile serrated colonic polyps 09/02/2018  . Arthritis of midfoot 05/28/2018  . Status post cataract extraction 04/13/2018  . Overweight 03/04/2018  . Low serum  vitamin B12 10/13/2017  . Morton's neuroma of third interspace of left foot 09/22/2017  . Loss of transverse plantar arch of left foot 09/22/2017  . Pain and swelling of left lower leg 08/20/2017  . Memory deficit 07/02/2017  . HTN (hypertension) 07/02/2017  . Dry skin 08/31/2016  . History of shingles 08/31/2016  . Left foot pain 05/24/2016  . Osteoporosis 02/21/2016  . Elevated LDL cholesterol level 11/26/2015  . Incontinence of urine in female 10/05/2015    Past Surgical History:  Procedure Laterality Date  . CHOLECYSTECTOMY  1995  . COLONOSCOPY  08/27/2018   TA polyps/ [place]  . EYE SURGERY Bilateral    cataracts removed/laser  . SACRAL NERVE STIMULATOR PLACEMENT  04/2018   To control overactive bladder  . TONSILLECTOMY  1962  . TUBAL LIGATION       OB History   No obstetric history on file.     Family History  Problem Relation Age of Onset  . Hematuria Father   . Heart disease Father   . Arthritis Father   . Heart disease Mother   . COPD Mother   . Atrial fibrillation Mother   . Hypertension Mother   . Colon cancer Daughter   . Rectal cancer Neg Hx   . Stomach cancer Neg Hx   . Esophageal cancer Neg Hx     Social History   Tobacco Use  .  Smoking status: Former    Types: Cigarettes    Quit date: 08/06/1993    Years since quitting: 27.8  . Smokeless tobacco: Never  Vaping Use  . Vaping Use: Never used  Substance Use Topics  . Alcohol use: No    Alcohol/week: 0.0 standard drinks  . Drug use: No    Home Medications Prior to Admission medications   Medication Sig Start Date End Date Taking? Authorizing Provider  amLODipine (NORVASC) 5 MG tablet Take 1 tablet (5 mg total) by mouth daily. 1 tablet every other day 04/07/20   Leone Haven, MD  Ascorbic Acid (VITAMIN C) 100 MG tablet Take 100 mg by mouth daily.    [provider]  benzonatate (TESSALON) 100 MG capsule Take 1 capsule (100 mg total) by mouth every 8 (eight) hours. 05/31/21    Domenic Moras, PA-C  donepezil (ARICEPT) 5 MG tablet Take 1 tablet (5 mg total) by mouth at bedtime. 03/01/21   Sater, Nanine Means, MD  escitalopram (LEXAPRO) 5 MG tablet Take 1 tablet (5 mg total) by mouth daily. 10/30/20   Sater, Nanine Means, MD  fluticasone (FLONASE) 50 MCG/ACT nasal spray SHAKE LIQUID AND USE 2 SPRAYS IN EACH NOSTRIL DAILY 11/20/20   Leone Haven, MD  latanoprost (XALATAN) 0.005 % ophthalmic solution Place 1 drop into both eyes at bedtime.  10/23/17   [provider]  memantine (NAMENDA XR) 21 MG CP24 24 hr capsule Take 1 capsule (21 mg total) by mouth daily. 03/01/21   Sater, Nanine Means, MD  memantine (NAMENDA XR) 7 MG CP24 24 hr capsule One po qd for one week, then two po qd for one week 03/01/21   Sater, Nanine Means, MD  NON FORMULARY Vitamin B 12 Injections q month, Last injection 11/09/19    [provider]    Allergies    Gabapentin, Hydrochlorothiazide, Hydrocodone-guaifenesin, Influenza vaccines, Mobic [meloxicam], Oxybutynin, Sulfa antibiotics, and Codeine  Review of Systems   Review of Systems  Physical Exam Updated Vital Signs BP (!) 153/65 (BP Location: Left Arm)   Pulse 85   Temp 98.2 F (36.8 C) (Oral)   Resp 18   Ht 5\' 4"  (1.626 m)   Wt 56.7 kg   LMP  (LMP Unknown)   SpO2 97%   BMI 21.46 kg/m   Physical Exam  ED Results / Procedures / Treatments   Labs (all labs ordered are listed, but only abnormal results are displayed) Labs Reviewed  RESP PANEL BY RT-PCR (FLU A&B, COVID) [id number]    EKG None  Radiology DG Chest 2 View  Result Date: 06/05/2021 CLINICAL DATA:  Cough for 2 weeks EXAM: CHEST - 2 VIEW COMPARISON:  Chest radiograph 05/31/2021 FINDINGS: The cardiomediastinal silhouette is stable. There is calcified atherosclerotic plaque of the aortic arch. On the frontal projection, there is ill-defined opacity in the right base with blunting of the costophrenic angle. The right base appears clear on the lateral projection. Findings are  favored to reflect atelectasis. Otherwise, there is no focal consolidation. There is no pulmonary edema. There is no pleural effusion or pneumothorax. There is a 1.0 cm nodular opacity projecting over the right midlung over the posterior eighth rib. There is no acute osseous abnormality. IMPRESSION: 1. Ill-defined opacity in the right base seen on the frontal projection is favored to reflect atelectasis. No evidence of focal consolidation or pleural effusion. 2. 1.0 cm nodular opacity projecting over the right midlung, for which nonemergent chest CT is recommended for better  evaluation. Electronically Signed   By: Valetta Mole M.D.   On: 06/05/2021 12:51    Procedures Procedures   Medications Ordered in ED Medications - No data to display  ED Course  I have reviewed the triage vital signs and the nursing notes.  Pertinent labs & imaging results that were available during my care of the patient were reviewed by me and considered in my medical decision making (see chart for details).    MDM Rules/Calculators/A&P                           Reviewed patient's prior visit in the ED on 05/31/2021.  Labs were personally reviewed by me, she did have a mild hypokalemia at 3.1 but nothing else remarkable.  No leukocytosis at the time, slightly decreased hemoglobin at 11.9 but not significantly anemic to the point of that would be a likely cause of her fatigue. Will repeat chest x-ray to assess for pneumonia, will also repeat COVID testing.  Patient is not having any chest pain, EKG done 2 days ago was also unremarkable.  Do not think we need additional EKG or lab work at this time.  Patient lungs are clear to auscultation bilaterally.  She is not tachypneic or tachycardic, no hypoxia.  She is COVID and flu negative, radiograph is notable for nodular opacity on x-ray that we will require nonemergent CT.  She also has a collection that is mucus with atelectasis, but also could be evident of early pneumonia.   We will proceed to treat with antibiotics.  Final Clinical Impression(s) / ED Diagnoses Final diagnoses:  None    Rx / DC Orders ED Discharge Orders     None        Sherrill Raring, Hershal Coria 06/05/21 Jerolyn Shin, MD 06/05/21 2356

## 2021-06-05 NOTE — ED Triage Notes (Signed)
Pt seen in ED on Thursday for same - cough and lethargy.  Pt states her symptoms have gotten worse since.

## 2021-06-05 NOTE — ED Provider Notes (Signed)
Emergency Medicine Provider Triage Evaluation Note  Lauren [name] , a 80 y.o. female  was evaluated in triage.  Pt complains of cough x2 weeks.  Productive cough, no hematemesis.  Patient is COVID vaccinated and boosted.  Called primary care doctor yesterday who advised to come in to the emergency department for evaluation for pneumonia..  Review of Systems  Positive: Cough, fever Negative: Shortness of breath, chest pain  Physical Exam  BP (!) 144/69 (BP Location: Left Arm)   Pulse 86   Temp 98.2 F (36.8 C) (Oral)   Resp 16   Ht 5\' 4"  (1.626 m)   Wt 56.7 kg   LMP  (LMP Unknown)   SpO2 95%   BMI 21.46 kg/m  Gen:   Awake, no distress   Resp:  Normal effort  MSK:   Moves extremities without difficulty  Other:    Medical Decision Making  Medically screening exam initiated at 12:14 PM.  Appropriate orders placed.  Lauren [name] was informed that the remainder of the evaluation will be completed by another provider, this initial triage assessment does not replace that evaluation, and the importance of remaining in the ED until their evaluation is complete.  Chest x-ray, COVID test.   Sherrill Raring, PA-C 06/05/21 1215    Pattricia Boss, MD 06/06/21 [phone]

## 2021-06-07 NOTE — Telephone Encounter (Signed)
Can she come in next Tuesday at 1 pm for an appointment?

## 2021-06-12 ENCOUNTER — Other Ambulatory Visit: Payer: Self-pay | Admitting: Family Medicine

## 2021-06-12 ENCOUNTER — Ambulatory Visit (INDEPENDENT_AMBULATORY_CARE_PROVIDER_SITE_OTHER): Payer: Medicare Other | Admitting: Family Medicine

## 2021-06-12 ENCOUNTER — Other Ambulatory Visit: Payer: Self-pay

## 2021-06-12 VITALS — BP 140/70 | HR 88 | Temp 98.4°F | Ht 61.0 in | Wt 136.4 lb

## 2021-06-12 DIAGNOSIS — E876 Hypokalemia: Secondary | ICD-10-CM

## 2021-06-12 DIAGNOSIS — J189 Pneumonia, unspecified organism: Secondary | ICD-10-CM

## 2021-06-12 DIAGNOSIS — R911 Solitary pulmonary nodule: Secondary | ICD-10-CM

## 2021-06-12 LAB — BASIC METABOLIC PANEL
BUN: 19 mg/dL (ref 6–23)
CO2: 31 mEq/L (ref 19–32)
Calcium: 9.7 mg/dL (ref 8.4–10.5)
Chloride: 100 mEq/L (ref 96–112)
Creatinine, Ser: 1.17 mg/dL (ref 0.40–1.20)
GFR: 44.69 mL/min — ABNORMAL LOW (ref >60.00)
Glucose, Bld: 136 mg/dL — ABNORMAL HIGH (ref 70–99)
Potassium: 3.8 mEq/L (ref 3.5–5.1)
Sodium: 140 mEq/L (ref 135–145)

## 2021-06-12 MED ORDER — ALBUTEROL SULFATE HFA 108 (90 BASE) MCG/ACT IN AERS: 2.0000 | INHALATION_SPRAY | Freq: Four times a day (QID) | RESPIRATORY_TRACT | 0 refills | Status: DC | PRN

## 2021-06-12 NOTE — Assessment & Plan Note (Signed)
Discussed need for CT scan to be done a few weeks from now to follow-up on the lung nodule.

## 2021-06-12 NOTE — Patient Instructions (Addendum)
Nice to see you. Please complete your antibiotics. You can use the albuterol inhaler if you are feeling short of breath.  If the shortness of breath does not progressively improve please let us know.

## 2021-06-12 NOTE — Assessment & Plan Note (Signed)
Check potassium again.

## 2021-06-12 NOTE — Assessment & Plan Note (Signed)
Patient has been progressively improving with her antibiotic treatment.  She does still have some dyspnea and may be some wheezing that they report though her exam appears benign today.  Discussed they can try an albuterol inhaler if she gets excessively short of breath.  If her breathing worsens she will be reevaluated.  If it does not progressively improve over the next 1 to 2 weeks she will let us know.

## 2021-06-12 NOTE — Progress Notes (Signed)
Tommi Rumps, MD Phone: [phone]  Lauren [name] is a 80 y.o. female who presents today for follow-up.  Community-acquired pneumonia.  The patient was sick with cough for about a week and a half.  She ended up going to the emergency room twice.  She was diagnosed with community-acquired pneumonia on the second visit.  She was treated with Augmentin and reports that she is improving.  Her daughter notes that she has still been somewhat winded and occasionally wheezes though they are unsure if this is improving or stable.  She does not feel congested.  She has not had any fevers.  She has progressively been sleeping less as she feels better.  They noted a nodule on her chest x-ray.  Her potassium was slightly low.  Social History   Tobacco Use  Smoking Status Former   Types: Cigarettes   Quit date: 08/06/1993   Years since quitting: 27.8  Smokeless Tobacco Never    Current Outpatient Medications on File Prior to Visit  Medication Sig Dispense Refill   amLODipine (NORVASC) 5 MG tablet Take 1 tablet (5 mg total) by mouth daily. 1 tablet every other day 90 tablet 2   amoxicillin-clavulanate (AUGMENTIN) 875-125 MG tablet Take 1 tablet by mouth every 12 (twelve) hours. 14 tablet 0   Ascorbic Acid (VITAMIN C) 100 MG tablet Take 100 mg by mouth daily.     azithromycin (ZITHROMAX) 250 MG tablet Take 1 tablet (250 mg total) by mouth daily. Take first 2 tablets together, then 1 every day until finished. 6 tablet 0   benzonatate (TESSALON) 100 MG capsule Take 1 capsule (100 mg total) by mouth every 8 (eight) hours. 21 capsule 0   donepezil (ARICEPT) 5 MG tablet Take 1 tablet (5 mg total) by mouth at bedtime. 90 tablet 3   escitalopram (LEXAPRO) 5 MG tablet Take 1 tablet (5 mg total) by mouth daily. 90 tablet 3   fluticasone (FLONASE) 50 MCG/ACT nasal spray SHAKE LIQUID AND USE 2 SPRAYS IN EACH NOSTRIL DAILY 48 g 0   latanoprost (XALATAN) 0.005 % ophthalmic solution Place 1 drop into both eyes  at bedtime.   1   memantine (NAMENDA XR) 21 MG CP24 24 hr capsule Take 1 capsule (21 mg total) by mouth daily. 30 capsule 11   memantine (NAMENDA XR) 7 MG CP24 24 hr capsule One po qd for one week, then two po qd for one week 21 capsule 0   NON FORMULARY Vitamin B 12 Injections q month, Last injection 11/09/19     Current Facility-Administered Medications on File Prior to Visit  Medication Dose Route Frequency Provider Last Rate Last Admin   betamethasone acetate-betamethasone sodium phosphate (CELESTONE) injection 3 mg  3 mg Intramuscular Once Edrick Kins, DPM         ROS see history of present illness  Objective  Physical Exam Vitals:   06/12/21 1312  BP: 140/70  Pulse: 88  Temp: 98.4 F (36.9 C)  SpO2: 98%    BP Readings from Last 3 Encounters:  06/12/21 140/70  06/05/21 (!) 169/66  05/31/21 (!) 166/66   Wt Readings from Last 3 Encounters:  06/12/21 136 lb 6.4 oz (61.9 kg)  06/05/21 125 lb (56.7 kg)  05/31/21 135 lb (61.2 kg)    Physical Exam Constitutional:      General: She is not in acute distress.    Appearance: She is not diaphoretic.  Cardiovascular:     Rate and Rhythm: Normal rate and regular  rhythm.     Heart sounds: Normal heart sounds.  Pulmonary:     Effort: Pulmonary effort is normal.     Breath sounds: Normal breath sounds.  Skin:    General: Skin is warm and dry.  Neurological:     Mental Status: She is alert.     Assessment/Plan: Please see individual problem list.  Problem List Items Addressed This Visit     Community acquired pneumonia    Patient has been progressively improving with her antibiotic treatment.  She does still have some dyspnea and may be some wheezing that they report though her exam appears benign today.  Discussed they can try an albuterol inhaler if she gets excessively short of breath.  If her breathing worsens she will be reevaluated.  If it does not progressively improve over the next 1 to 2 weeks she will let us  know.      Relevant Medications   albuterol (VENTOLIN HFA) 108 (90 Base) MCG/ACT inhaler   Hypokalemia - Primary    Check potassium again.      Relevant Orders   Basic Metabolic Panel (BMET)   Lung nodule    Discussed need for CT scan to be done a few weeks from now to follow-up on the lung nodule.      Relevant Orders   CT Chest Wo Contrast   Return in about 3 months (around 09/12/2021).  This visit occurred during the SARS-CoV-2 public health emergency.  Safety protocols were in place, including screening questions prior to the visit, additional usage of staff PPE, and extensive cleaning of exam room while observing appropriate contact time as indicated for disinfecting solutions.    Tommi Rumps, MD [place]

## 2021-06-13 ENCOUNTER — Telehealth: Payer: Self-pay | Admitting: Family Medicine

## 2021-06-13 NOTE — Telephone Encounter (Signed)
Lft pt son a vm to call ofc to sch CT. thanks

## 2021-06-15 ENCOUNTER — Other Ambulatory Visit: Payer: Self-pay | Admitting: Family Medicine

## 2021-06-15 DIAGNOSIS — N1831 Chronic kidney disease, stage 3a: Secondary | ICD-10-CM

## 2021-06-21 ENCOUNTER — Other Ambulatory Visit: Payer: Self-pay | Admitting: Family Medicine

## 2021-07-02 ENCOUNTER — Ambulatory Visit: Payer: Medicare Other

## 2021-07-02 ENCOUNTER — Other Ambulatory Visit: Payer: Self-pay

## 2021-07-02 ENCOUNTER — Ambulatory Visit
Admission: RE | Admit: 2021-07-02 | Discharge: 2021-07-02 | Disposition: A | Discharge status: Home / Self Care | Payer: Medicare Other | Source: Ambulatory Visit | Attending: Family Medicine | Admitting: Family Medicine

## 2021-07-02 DIAGNOSIS — N183 Chronic kidney disease, stage 3 unspecified: Secondary | ICD-10-CM | POA: Diagnosis not present

## 2021-07-02 DIAGNOSIS — J439 Emphysema, unspecified: Secondary | ICD-10-CM | POA: Diagnosis not present

## 2021-07-02 DIAGNOSIS — N1831 Chronic kidney disease, stage 3a: Secondary | ICD-10-CM | POA: Insufficient documentation

## 2021-07-02 DIAGNOSIS — R911 Solitary pulmonary nodule: Secondary | ICD-10-CM | POA: Diagnosis not present

## 2021-07-02 DIAGNOSIS — R918 Other nonspecific abnormal finding of lung field: Secondary | ICD-10-CM | POA: Diagnosis not present

## 2021-07-02 DIAGNOSIS — I700 Atherosclerosis of aorta: Secondary | ICD-10-CM | POA: Diagnosis not present

## 2021-07-03 ENCOUNTER — Other Ambulatory Visit: Payer: Self-pay | Admitting: Family Medicine

## 2021-07-03 DIAGNOSIS — R911 Solitary pulmonary nodule: Secondary | ICD-10-CM

## 2021-07-20 ENCOUNTER — Other Ambulatory Visit: Payer: Self-pay | Admitting: Neurology

## 2021-07-25 ENCOUNTER — Telehealth: Payer: Self-pay | Admitting: Neurology

## 2021-07-25 MED ORDER — ESCITALOPRAM OXALATE 5 MG PO TABS: 5.0000 mg | ORAL_TABLET | Freq: Every day | ORAL | 1 refills | Status: DC

## 2021-07-25 NOTE — Telephone Encounter (Signed)
Val with [facility] called requesting refill for pt's escitalopram (LEXAPRO) 5 MG tablet. [place] [phone].

## 2021-07-25 NOTE — Telephone Encounter (Signed)
Refill has been sent for the patient since requested by the pharmacy

## 2021-08-06 ENCOUNTER — Telehealth: Payer: Self-pay | Admitting: Family Medicine

## 2021-08-06 NOTE — Telephone Encounter (Signed)
From my perspective she can continue on the Prolia though do I need to have her into the office to document this discussion?

## 2021-08-06 NOTE — Telephone Encounter (Signed)
Patien t son to call back and schedule Prolia injection for January Okay to schedule. Please schedule and let me know date.

## 2021-08-06 NOTE — Telephone Encounter (Signed)
Aniyla Norman Herrlich Key: [id number] - PA Case ID: [id number]  Additional Information Required This medication or product is on your plan's list of covered drugs. Prior authorization is not required at this time. If your pharmacy has questions regarding the processing of your prescription, please have them call the [facility] pharmacy help desk at [phone]. **Please note: This request was submitted electronically. Formulary lowering, tiering exception, cost reduction and/or pre-benefit determination review (including prospective Medicare hospice reviews) requests cannot be requested using this method of submission. Providers contact us at [phone] for further assistance

## 2021-08-06 NOTE — Telephone Encounter (Signed)
PatienT last Prolia due in 0/16/22 patien twa sin ED was to discuss at follow up. Patient to continue Prolia.

## 2021-08-08 DIAGNOSIS — R911 Solitary pulmonary nodule: Secondary | ICD-10-CM | POA: Diagnosis not present

## 2021-08-23 ENCOUNTER — Encounter: Payer: Self-pay | Admitting: Family

## 2021-08-23 ENCOUNTER — Ambulatory Visit (INDEPENDENT_AMBULATORY_CARE_PROVIDER_SITE_OTHER): Payer: Medicare Other | Admitting: Family

## 2021-08-23 VITALS — Ht 61.0 in | Wt 136.5 lb

## 2021-08-23 DIAGNOSIS — R053 Chronic cough: Secondary | ICD-10-CM | POA: Diagnosis not present

## 2021-08-23 DIAGNOSIS — J309 Allergic rhinitis, unspecified: Secondary | ICD-10-CM | POA: Diagnosis not present

## 2021-08-23 MED ORDER — FLUTICASONE PROPIONATE 50 MCG/ACT NA SUSP: NASAL | 0 refills | Status: DC

## 2021-08-23 MED ORDER — ALBUTEROL SULFATE HFA 108 (90 BASE) MCG/ACT IN AERS: 2.0000 | INHALATION_SPRAY | Freq: Four times a day (QID) | RESPIRATORY_TRACT | 0 refills | Status: DC | PRN

## 2021-08-23 NOTE — Progress Notes (Signed)
Virtual telephone visit    Virtual Visit via Telephone Note   This visit type was conducted due to national recommendations for restrictions regarding the COVID-19 Pandemic (e.g. social distancing) in an effort to limit this patient's exposure and mitigate transmission in our community. Due to her co-morbid illnesses, this patient is at least at moderate risk for complications without adequate follow up. This format is felt to be most appropriate for this patient at this time. The patient did not have access to video technology or had technical difficulties with video requiring transitioning to audio format only (telephone). Physical exam was limited to content and character of the telephone converstion. CMA was able to get the patient set up on a telephone visit.   Patient location: Home. Patient and provider in visit Provider location: Office  I discussed the limitations of evaluation and management by telemedicine and the availability of in person appointments. The patient expressed understanding and agreed to proceed.   Visit Date: 08/23/2021  Today's healthcare provider: Jeanie Sewer, NP     Subjective:    Patient ID: Lauren [name], female    DOB: 10/03/1940, 80 y.o.   MRN: [id number]  Chief Complaint  Patient presents with   Cough    Started last Wednesday. She has taken Mucinex, it helps when she takes it but it has not subsided.    Nasal Congestion    HPI Upper Respiratory Infection: Symptoms include congestion, nasal congestion, no  fever, non productive cough, and post nasal drip.  Onset of symptoms was 7 days ago, unchanged since that time. She is drinking moderate amounts of fluids. Evaluation to date: none.  Treatment to date: none.  Past Medical History:  Diagnosis Date   Allergic rhinitis    Allergy    Cataract    removed by surgery   Cecal angiodysplasia 11/2019   Chickenpox    Elevated LDL cholesterol level 11/26/2015   no meds, diet controlled    Gallstones    removed by surgery   Glaucoma    HTN (hypertension)    Hx of adenomatous and sessile serrated colonic polyps 09/02/2018   Incontinence of urine in female 10/05/2015   Kidney stones    removed with gallbladder surgery per patient   Migraines    Urinary incontinence     Past Surgical History:  Procedure Laterality Date   CHOLECYSTECTOMY  1995   COLONOSCOPY  08/27/2018   TA polyps/ [place]   EYE SURGERY Bilateral    cataracts removed/laser   SACRAL NERVE STIMULATOR PLACEMENT  04/2018   To control overactive bladder   TONSILLECTOMY  1962   TUBAL LIGATION      Outpatient Medications Prior to Visit  Medication Sig Dispense Refill   amLODipine (NORVASC) 5 MG tablet TABLET 1 TABLET BY MOUTH EVERY OTHER DAY 90 tablet 2   amoxicillin-clavulanate (AUGMENTIN) 875-125 MG tablet Take 1 tablet by mouth every 12 (twelve) hours. 14 tablet 0   Ascorbic Acid (VITAMIN C) 100 MG tablet Take 100 mg by mouth daily.     azithromycin (ZITHROMAX) 250 MG tablet Take 1 tablet (250 mg total) by mouth daily. Take first 2 tablets together, then 1 every day until finished. 6 tablet 0   benzonatate (TESSALON) 100 MG capsule Take 1 capsule (100 mg total) by mouth every 8 (eight) hours. 21 capsule 0   donepezil (ARICEPT) 5 MG tablet Take 1 tablet (5 mg total) by mouth at bedtime. 90 tablet 3   escitalopram (LEXAPRO) 5  MG tablet Take 1 tablet (5 mg total) by mouth daily. 90 tablet 1   latanoprost (XALATAN) 0.005 % ophthalmic solution Place 1 drop into both eyes at bedtime.   1   memantine (NAMENDA XR) 21 MG CP24 24 hr capsule Take 1 capsule (21 mg total) by mouth daily. 30 capsule 11   memantine (NAMENDA XR) 7 MG CP24 24 hr capsule One po qd for one week, then two po qd for one week 21 capsule 0   NON FORMULARY Vitamin B 12 Injections q month, Last injection 11/09/19     albuterol (VENTOLIN HFA) 108 (90 Base) MCG/ACT inhaler Inhale 2 puffs into the lungs every 6 (six) hours as needed for wheezing or  shortness of breath. 8 g 0   fluticasone (FLONASE) 50 MCG/ACT nasal spray SHAKE LIQUID AND USE 2 SPRAYS IN EACH NOSTRIL DAILY 48 g 0   Facility-Administered Medications Prior to Visit  Medication Dose Route Frequency Provider Last Rate Last Admin   betamethasone acetate-betamethasone sodium phosphate (CELESTONE) injection 3 mg  3 mg Intramuscular Once Edrick Kins, DPM        Allergies  Allergen Reactions   Gabapentin Other (See Comments)    Unable to sleep   Hydrochlorothiazide Other (See Comments)    Leg pain and aches   Hydrocodone-Guaifenesin Other (See Comments)    Cant sleep    Influenza Vaccines Other (See Comments)    Nausea and vomiting and stomach pain for 10 weeks   Mobic [Meloxicam] Swelling   Oxybutynin     Memory loss per patient   Sulfa Antibiotics Other (See Comments)    Cold sweats, passing out    Codeine Rash    Cold sweats         Objective:     Ht 5\' 1"  (1.549 m)    Wt 136 lb 7.4 oz (61.9 kg)    LMP  (LMP Unknown)    BMI 25.78 kg/m   Wt Readings from Last 3 Encounters:  08/23/21 136 lb 7.4 oz (61.9 kg)  06/12/21 136 lb 6.4 oz (61.9 kg)  06/05/21 125 lb (56.7 kg)        Assessment & Plan:   Problem List Items Addressed This Visit       Respiratory   Allergic rhinitis   Relevant Medications   fluticasone (FLONASE) 50 MCG/ACT nasal spray     Other   Persistent cough - Primary   Relevant Medications   albuterol (VENTOLIN HFA) 108 (90 Base) MCG/ACT inhaler     I discussed the assessment and treatment plan with the patient. The patient was provided an opportunity to ask questions and all were answered. The patient agreed with the plan and demonstrated an understanding of the instructions.   The patient was advised to call back or seek an in-person evaluation if the symptoms worsen or if the condition fails to improve as anticipated.  I provided 24 minutes of non-face-to-face time during this encounter.   Jeanie Sewer,  NP [place] [phone] (phone) [phone] (fax)  [place]

## 2021-09-04 ENCOUNTER — Ambulatory Visit: Payer: Medicare Other | Admitting: Pulmonary Disease

## 2021-09-06 ENCOUNTER — Encounter: Payer: Self-pay | Admitting: Pulmonary Disease

## 2021-09-06 ENCOUNTER — Ambulatory Visit: Payer: Medicare Other | Admitting: Pulmonary Disease

## 2021-09-06 ENCOUNTER — Other Ambulatory Visit: Payer: Self-pay

## 2021-09-06 ENCOUNTER — Telehealth: Payer: Self-pay | Admitting: Pulmonary Disease

## 2021-09-06 VITALS — BP 148/70 | HR 84 | Temp 97.3°F | Ht 62.0 in | Wt 138.6 lb

## 2021-09-06 DIAGNOSIS — G309 Alzheimer's disease, unspecified: Secondary | ICD-10-CM

## 2021-09-06 DIAGNOSIS — R911 Solitary pulmonary nodule: Secondary | ICD-10-CM

## 2021-09-06 DIAGNOSIS — F0280 Dementia in other diseases classified elsewhere without behavioral disturbance: Secondary | ICD-10-CM

## 2021-09-06 NOTE — Patient Instructions (Signed)
We have scheduled you for your procedure on 25 January at 1230  We will obtain another CT of the chest that is specialized and will let me map out the area this will be done prior to the procedure  We will see you in follow-up in 4 to 6 weeks time after that however we will stay in contact with you after the procedure via phone for any other communications.

## 2021-09-06 NOTE — Telephone Encounter (Addendum)
Robotic bronchoscopy with navigation and cellvizio scheduled for 09/19/2021 at 12:30. [id number] nodule [id number], [place], please see bronch info

## 2021-09-06 NOTE — Progress Notes (Signed)
Subjective:    Patient ID: Lauren [name], female    DOB: 21-Jul-1941, 81 y.o.   MRN: [id number] Chief Complaint  Patient presents with   Consult    Lung nodule     HPI Patient is a 81 year old very remote former smoker (quit 1994), who presents for consideration of robotic bronchoscopy for diagnosis of a right upper lobe nodule incidentally found on CT scan of the chest in November.  She is kindly referred by Dr. Claudette Stapler her primary care physician is Dr. Tommi Rumps.  The nodule in question was noted on a chest x-ray performed in October 2022.  The patient had a cough for about a week and a half and required evaluation at the emergency room.  She was diagnosed with community-acquired pneumonia on the second visit.  She was treated with Augmentin and she improved after that.  Since that illness she has not had any further issues with cough or wheezing.  As noted the chest x-ray in October showed 1 cm nodule this was followed by a CT scan of the chest on 7 November which confirmed a 1 cm noncalcified nodule in the right upper lobe.  There are no prior chest imaging for comparison.  Report of a single chest x-ray performed in 1995 does not mention any nodules.  The patient is asymptomatic with regards to this nodule.  She has not had any shortness of breath, fevers, chills or sweats.  No cough of late.  No sputum production and no hemoptysis.  No weight loss or anorexia.  She has early Alzheimer's disease and is currently on Aricept and Namenda and family has noted improvement on her cognition with these medications.  Today she presents with her daughter and son and is able to participate well in the discussions.  Review of Systems A 10 point review of systems was performed and it is as noted above otherwise negative.  Past Medical History:  Diagnosis Date   Allergic rhinitis    Allergy    Cataract    removed by surgery   Cecal angiodysplasia 11/2019   Chickenpox    Elevated LDL  cholesterol level 11/26/2015   no meds, diet controlled   Gallstones    removed by surgery   Glaucoma    HTN (hypertension)    Hx of adenomatous and sessile serrated colonic polyps 09/02/2018   Incontinence of urine in female 10/05/2015   Kidney stones    removed with gallbladder surgery per patient   Migraines    Urinary incontinence    Past Surgical History:  Procedure Laterality Date   CHOLECYSTECTOMY  1995   COLONOSCOPY  08/27/2018   TA polyps/ [place]   EYE SURGERY Bilateral    cataracts removed/laser   SACRAL NERVE STIMULATOR PLACEMENT  04/2018   To control overactive bladder   TONSILLECTOMY  1962   TUBAL LIGATION     Patient Active Problem List   Diagnosis Date Noted   Persistent cough 08/23/2021   Community acquired pneumonia 06/12/2021   Hypokalemia 06/12/2021   Lung nodule 06/12/2021   Alzheimer disease ([place]) 10/30/2020   Depression with anxiety 10/30/2020   Left rotator cuff tear 02/08/2020   Left shoulder pain 01/19/2020   Papule of skin 01/19/2020   Glaucoma 01/19/2020   Allergic rhinitis 11/15/2019   Eustachian tube dysfunction, bilateral 05/22/2019   Family history of colon cancer 05/22/2019   Other bursal cyst, left ankle and foot 10/27/2018   Hx of adenomatous and sessile serrated colonic  polyps 09/02/2018   Arthritis of midfoot 05/28/2018   Status post cataract extraction 04/13/2018   Overweight 03/04/2018   Low serum vitamin B12 10/13/2017   Morton's neuroma of third interspace of left foot 09/22/2017   Loss of transverse plantar arch of left foot 09/22/2017   Pain and swelling of left lower leg 08/20/2017   Memory deficit 07/02/2017   HTN (hypertension) 07/02/2017   Dry skin 08/31/2016   History of shingles 08/31/2016   Left foot pain 05/24/2016   Osteoporosis 02/21/2016   Elevated LDL cholesterol level 11/26/2015   Incontinence of urine in female 10/05/2015   Family History  Problem Relation Age of Onset   Hematuria Father    Heart disease  Father    Arthritis Father    Heart disease Mother    COPD Mother    Atrial fibrillation Mother    Hypertension Mother    Colon cancer Daughter    Rectal cancer Neg Hx    Stomach cancer Neg Hx    Esophageal cancer Neg Hx    Social History   Tobacco Use   Smoking status: Former    Types: Cigarettes    Quit date: 08/06/1993    Years since quitting: 28.1   Smokeless tobacco: Never  Substance Use Topics   Alcohol use: No    Alcohol/week: 0.0 standard drinks   Allergies  Allergen Reactions   Gabapentin Other (See Comments)    Unable to sleep   Hydrochlorothiazide Other (See Comments)    Leg pain and aches   Hydrocodone-Guaifenesin Other (See Comments)    Cant sleep    Influenza Vaccines Other (See Comments)    Nausea and vomiting and stomach pain for 10 weeks   Mobic [Meloxicam] Swelling   Oxybutynin     Memory loss per patient   Sulfa Antibiotics Other (See Comments)    Cold sweats, passing out    Codeine Rash    Cold sweats    Current Meds  Medication Sig   albuterol (VENTOLIN HFA) 108 (90 Base) MCG/ACT inhaler Inhale 2 puffs into the lungs every 6 (six) hours as needed for wheezing or shortness of breath.   amLODipine (NORVASC) 5 MG tablet TABLET 1 TABLET BY MOUTH EVERY OTHER DAY   Ascorbic Acid (VITAMIN C) 100 MG tablet Take 100 mg by mouth daily.   donepezil (ARICEPT) 5 MG tablet Take 1 tablet (5 mg total) by mouth at bedtime.   escitalopram (LEXAPRO) 5 MG tablet Take 1 tablet (5 mg total) by mouth daily.   fluticasone (FLONASE) 50 MCG/ACT nasal spray 1 spray each nostril twice a day until sinus symptoms improved, then 1 time per day for 1 week, then can continue or stop based on if any allergy symptoms also.   latanoprost (XALATAN) 0.005 % ophthalmic solution Place 1 drop into both eyes at bedtime.    memantine (NAMENDA XR) 21 MG CP24 24 hr capsule Take 1 capsule (21 mg total) by mouth daily.   memantine (NAMENDA XR) 7 MG CP24 24 hr capsule One po qd for one week,  then two po qd for one week   NON FORMULARY Vitamin B 12 Injections q month, Last injection 11/09/19   [DISCONTINUED] amoxicillin-clavulanate (AUGMENTIN) 875-125 MG tablet Take 1 tablet by mouth every 12 (twelve) hours.   [DISCONTINUED] azithromycin (ZITHROMAX) 250 MG tablet Take 1 tablet (250 mg total) by mouth daily. Take first 2 tablets together, then 1 every day until finished.   [DISCONTINUED] benzonatate (TESSALON) 100 MG capsule  Take 1 capsule (100 mg total) by mouth every 8 (eight) hours.   Current Facility-Administered Medications for the 09/06/21 encounter (Office Visit) with Tyler Pita, MD  Medication   betamethasone acetate-betamethasone sodium phosphate (CELESTONE) injection 3 mg   Immunization History  Administered Date(s) Administered   Influenza Inj Mdck Quad Pf 06/18/2019, 05/19/2020   PFIZER(Purple Top)SARS-COV-2 Vaccination 10/16/2019, 11/09/2019, 06/26/2020, 12/10/2020   Pneumococcal Conjugate-13 12/10/2017   Pneumococcal Polysaccharide-23 10/05/2015, 10/14/2016   Tdap 06/09/2020      Objective:   Physical Exam BP (!) 148/70 (BP Location: Left Arm, Patient Position: Sitting, Cuff Size: Normal)    Pulse 84    Temp (!) 97.3 F (36.3 C) (Oral)    Ht 5\' 2"  (1.575 m)    Wt 138 lb 9.6 oz (62.9 kg)    LMP  (LMP Unknown)    SpO2 96%    BMI 25.35 kg/m  GENERAL: Well-nourished, well-developed, fully ambulatory, no conversational dyspnea. HEAD: Normocephalic, atraumatic.  EYES: Pupils equal, round, reactive to light.  No scleral icterus.  MOUTH: Nose/mouth/throat not examined due to masking requirements for COVID 19. NECK: Supple. No thyromegaly. Trachea midline. No JVD.  No adenopathy. PULMONARY: Good air entry bilaterally.  No adventitious sounds. CARDIOVASCULAR: S1 and S2. Regular rate and rhythm.  ABDOMEN: Benign. MUSCULOSKELETAL: No joint deformity, no clubbing, no edema.  NEUROLOGIC: No focal deficit, no gait disturbance, speech is fluent. SKIN:  Intact,warm,dry. PSYCH: Mild memory impairment, normal mood and behavior  Representative slice of CT performed 02 July 2021 showing a 1 cm nodule on the right upper lobe:      Assessment & Plan:     ICD-10-CM   1. Lung nodule - RUL 1 cm  R91.1 CT SUPER D CHEST WO [facility] PILOT   Recommend robotic bronchoscopy for biopsy Discussed in detail with patient and family They are in agreement to proceed with robotic bronchoscopy    2. Alzheimer's disease ([place])- early dementia  G30.9    F02.80    Cognitive impairment mild at this point Patient continues to be able to engage in ADLs     Orders Placed This Encounter  Procedures   CT SUPER D CHEST WO [facility] PILOT    [facility] Protocol  Slice Thickness of 4.19 Slice Interval 0.5 Arms down Inspiratory Image    Standing Status:   Future    Standing Expiration Date:   09/06/2022    Order Specific Question:   Preferred imaging location?    Answer:   [facility]   Benefits, limitations and potential complications of the procedure were discussed with the patient and family.  Complications from bronchoscopy are rare and most often minor, but if they occur they may include breathing difficulty, vocal cord spasm, hoarseness, slight fever, vomiting, dizziness, bronchospasm, infection, low blood oxygen, bleeding from biopsy site, or an allergic reaction to medications.  It is uncommon for patients to experience other more serious complications for example: Collapsed lung requiring chest tube placement, respiratory failure, heart attack and/or cardiac arrhythmia.  Patient and family agree to the procedure  Patient and family are aware of the need for biopsy for definitive diagnosis of the right upper lobe nodule.  They understand the procedure will be done under general anesthesia.  Patient has mild cognitive impairment but was able to understand and ask appropriate questions.  Family present and likewise they asked appropriate questions and these  were answered to their satisfaction.  They understand the need for a repeat CT scan for mapping purposes  for the procedure.  Procedure scheduled for 25 January at 12:30 PM.  We will see the patient in follow-up in 4 to 6 weeks time she is to contact us prior to that time should any new difficulties arise.  Total visit time 50 minutes.  Renold Don, MD Advanced Bronchoscopy PCCM [facility]    *This note was dictated using voice recognition software/Dragon.  Despite best efforts to proofread, errors can occur which can change the meaning. Any transcriptional errors that result from this process are unintentional and may not be fully corrected at the time of dictation.

## 2021-09-07 NOTE — Telephone Encounter (Signed)
Katie with pre admit testing is aware of request and updated appt note. [name](DPR) is aware and voiced her understanding.  Nothing further needed.

## 2021-09-07 NOTE — Telephone Encounter (Addendum)
Phone pre admit visit 09/12/2021 between 8-1 and covid test 09/17/2021 between 8-12.  Patient's daughter, [name](DPR) is aware of dates/times. She is requesting for phone visit to be around 10:00 to ensure that someone will be there to accompany patient as she has Alzheimer.   Left message for pre admit testing.

## 2021-09-07 NOTE — Telephone Encounter (Signed)
Prior Auth Not Required for codes [phone], [phone] Refer # [id number]

## 2021-09-10 ENCOUNTER — Ambulatory Visit: Payer: Medicare Other | Admitting: Neurology

## 2021-09-10 ENCOUNTER — Encounter: Payer: Self-pay | Admitting: Neurology

## 2021-09-10 VITALS — BP 126/74 | HR 84 | Ht 62.0 in | Wt 137.0 lb

## 2021-09-10 DIAGNOSIS — F418 Other specified anxiety disorders: Secondary | ICD-10-CM | POA: Diagnosis not present

## 2021-09-10 DIAGNOSIS — G309 Alzheimer's disease, unspecified: Secondary | ICD-10-CM | POA: Diagnosis not present

## 2021-09-10 DIAGNOSIS — F0280 Dementia in other diseases classified elsewhere without behavioral disturbance: Secondary | ICD-10-CM | POA: Diagnosis not present

## 2021-09-10 MED ORDER — DONEPEZIL HCL 10 MG PO TABS: 10.0000 mg | ORAL_TABLET | Freq: Every day | ORAL | 4 refills | Status: DC

## 2021-09-10 MED ORDER — MEMANTINE HCL ER 21 MG PO CP24: 21.0000 mg | ORAL_CAPSULE | Freq: Every day | ORAL | 3 refills | Status: DC

## 2021-09-10 MED ORDER — ESCITALOPRAM OXALATE 5 MG PO TABS: 5.0000 mg | ORAL_TABLET | Freq: Every day | ORAL | 3 refills | Status: DC

## 2021-09-10 NOTE — Progress Notes (Signed)
[facility]  PATIENT: Lauren [name] DOB: 1941/03/14  REFERRING DOCTOR OR PCP:  [name] SOURCE: Patient, notes from primary care, lab results  _________________________________   HISTORICAL  CHIEF COMPLAINT:  Chief Complaint  Patient presents with   Follow-up    Rm 1, w daughter and son. Here for 6 month AD. Pt reports memory has worsend since last OV. Pt has noticed worsening in short term memory. Forgetting things in less then 10 min. Pt did get lost within a five miles radius and has not driven since. Caregiver has been staying with pt longer. Daughter will be moving in with pt. Pt get easily confused between dreams and reality. Time concept is gone. MOCA:8    HISTORY OF PRESENT ILLNESS:  Lauren [name] is a 81 y.o. woman with Alzheimer's disease.    Update 1/16/203: She states that she feels fine.   She denies much problems.   She is living alone and spends her day doing chores.    Her daughter  notes she is doing worse and provided more details.   She got lost while driving (was in [place] - got lost).   She no longer drives.  She has no concept of time.   Her daughter is going to move in with her.  She needs reminders to [facility] a shower.   She sometimes needs help completely getting dressed.   Family handles finances.   She is tolerating her medications well.  She watched TV and crochets.  Her daughter does that she got some benefit initially with the donepezil but not as much lately.  Mood is better on Lexapro  She was hospitalized with pneumonia and has a ling nodule - getting another CT scan and will need sedation.    Gait is fine.   Bladder function is fine.     She states she is sleeping well and goes to bed around 930 pm and wakes up 630 am.-830 am   She does not wake up at night.    Her daughter notes, however, she sometimes is still in bed at noon .   Memory loss history: She started to experience cognitive issues in 2020.  She notes  she misplaces items.    She is still driving.   She had one episode where she got lost driving.    She used [employer] which helped her get home.   She continues to do household ADLs like cooking but has more trouble with recipes.  Her daughter notices she has trouble with dates and forgets appointments.   She has trouble with names.     She has forgotten to turn the oven off and lock doors.  She started donepezil 5 mg 10/2020 and memantine 02/2021   Montreal Cognitive Assessment  09/10/2021 03/01/2021 03/01/2021 10/30/2020  Visuospatial/ Executive (0/5) 0 1 0 2  Naming (0/3) 3 2 2 3   Attention: Read list of digits (0/2) 1 1 0 2  Attention: Read list of letters (0/1) 0 0 0 0  Attention: Serial 7 subtraction starting at 100 (0/3) 0 1 1 2   Language: Repeat phrase (0/2) 0 1 0 2  Language : Fluency (0/1) 0 0 0 0  Abstraction (0/2) 1 2 2 2   Delayed Recall (0/5) 0 0 0 0  Orientation (0/6) 3 4 2 6   Total 8 12 7 19   Adjusted Score (based on education) - 12 - 19   MMSE - Mini Mental State Exam 09/27/2020 07/02/2017 11/25/2016  Orientation to time 5 5 5   Orientation to Place 5 5 5   Registration 3 3 3   Attention/ Calculation 3 3 5   Recall 1 3 3   Language- name 2 objects 2 2 2   Language- repeat 1 1 1   Language- follow 3 step command 2 3 3   Language- read & follow direction 1 1 1   Write a sentence 1 1 1   Copy design 1 1 1   Total score 25 28 30      TSH and B12 were normal  REVIEW OF SYSTEMS: Constitutional: No fevers, chills, sweats, or change in appetite Eyes: No visual changes, double vision, eye pain Ear, nose and throat: No hearing loss, ear pain, nasal congestion, sore throat Cardiovascular: No chest pain, palpitations Respiratory:  No shortness of breath at rest or with exertion.   No wheezes GastrointestinaI: No nausea, vomiting, diarrhea, abdominal pain, fecal incontinence Genitourinary:  No dysuria, urinary retention or frequency.  No nocturia. Musculoskeletal:  No neck pain, back  pain Integumentary: No rash, pruritus, skin lesions Neurological: as above Psychiatric: Some depression and anxiety Endocrine: No palpitations, diaphoresis, change in appetite, change in weigh or increased thirst Hematologic/Lymphatic:  No anemia, purpura, petechiae. Allergic/Immunologic: No itchy/runny eyes, nasal congestion, recent allergic reactions, rashes  ALLERGIES: Allergies  Allergen Reactions   Gabapentin Other (See Comments)    Unable to sleep   Hydrochlorothiazide Other (See Comments)    Leg pain and aches   Hydrocodone-Guaifenesin Other (See Comments)    Cant sleep    Influenza Vaccines Nausea And Vomiting    Nausea and vomiting and stomach pain for 10 weeks   Mobic [Meloxicam] Swelling   Oxybutynin     Memory loss per patient   Sulfa Antibiotics Other (See Comments)    Cold sweats, passing out    Codeine Rash    Cold sweats     HOME MEDICATIONS:  Current Outpatient Medications:    amLODipine (NORVASC) 5 MG tablet, TABLET 1 TABLET BY MOUTH EVERY OTHER DAY, Disp: 90 tablet, Rfl: 2   fluticasone (FLONASE) 50 MCG/ACT nasal spray, 1 spray each nostril twice a day until sinus symptoms improved, then 1 time per day for 1 week, then can continue or stop based on if any allergy symptoms also., Disp: 48 g, Rfl: 0   latanoprost (XALATAN) 0.005 % ophthalmic solution, Place 1 drop into both eyes at bedtime. , Disp: , Rfl: 1   donepezil (ARICEPT) 10 MG tablet, Take 1 tablet (10 mg total) by mouth at bedtime., Disp: 90 tablet, Rfl: 4   escitalopram (LEXAPRO) 5 MG tablet, Take 1 tablet (5 mg total) by mouth daily., Disp: 90 tablet, Rfl: 3   memantine (NAMENDA XR) 21 MG CP24 24 hr capsule, Take 1 capsule (21 mg total) by mouth daily., Disp: 90 capsule, Rfl: 3  Current Facility-Administered Medications:    betamethasone acetate-betamethasone sodium phosphate (CELESTONE) injection 3 mg, 3 mg, Intramuscular, Once, Edrick Kins, DPM  PAST MEDICAL HISTORY: Past Medical History:   Diagnosis Date   Allergic rhinitis    Allergy    Cataract    removed by surgery   Cecal angiodysplasia 11/2019   Chickenpox    Elevated LDL cholesterol level 11/26/2015   no meds, diet controlled   Gallstones    removed by surgery   Glaucoma    HTN (hypertension)    Hx of adenomatous and sessile serrated colonic polyps 09/02/2018   Incontinence of urine in female 10/05/2015   Kidney stones    removed with  gallbladder surgery per patient   Migraines    Urinary incontinence     PAST SURGICAL HISTORY: Past Surgical History:  Procedure Laterality Date   CHOLECYSTECTOMY  1995   COLONOSCOPY  08/27/2018   TA polyps/ [place]   EYE SURGERY Bilateral    cataracts removed/laser   SACRAL NERVE STIMULATOR PLACEMENT  04/2018   To control overactive bladder   TONSILLECTOMY  1962   TUBAL LIGATION      FAMILY HISTORY: Family History  Problem Relation Age of Onset   Hematuria Father    Heart disease Father    Arthritis Father    Heart disease Mother    COPD Mother    Atrial fibrillation Mother    Hypertension Mother    Colon cancer Daughter    Rectal cancer Neg Hx    Stomach cancer Neg Hx    Esophageal cancer Neg Hx     SOCIAL HISTORY:  Social History   Socioeconomic History   Marital status: Widowed    Spouse name: Not on file   Number of children: 2   Years of education: Not on file   Highest education level: Not on file  Occupational History   Occupation: retired  Tobacco Use   Smoking status: Former    Types: Cigarettes    Quit date: 08/06/1993    Years since quitting: 28.1   Smokeless tobacco: Never  Vaping Use   Vaping Use: Never used  Substance and Sexual Activity   Alcohol use: No    Alcohol/week: 0.0 standard drinks   Drug use: No   Sexual activity: Not Currently    Birth control/protection: Post-menopausal  Other Topics Concern   Not on file  Social History Narrative   Right handed   Widowed   1 son and 1 daughter   She is a retired Radiation protection practitioner    Former smoker   No alcohol or caffeine or drugs or any tobacco   Social Determinants of Radio broadcast assistant Strain: Not on file  Food Insecurity: Not on file  Transportation Needs: Not on file  Physical Activity: Not on file  Stress: Not on file  Social Connections: Not on file  Intimate Partner Violence: Not on file     PHYSICAL EXAM  Vitals:   09/10/21 0804  BP: 126/74  Pulse: 84  SpO2: 97%  Weight: 137 lb (62.1 kg)  Height: 5\' 2"  (1.575 m)    Body mass index is 25.06 kg/m.   General: The patient is well-developed and well-nourished and in no acute distress  HEENT:  Head is [place]/AT.  Sclera are anicteric.    Neck:  The neck is nontender.  Skin: Extremities are without rash or  edema.  Neurologic Exam  Mental status: The patient is alert and oriented x 3 at the time of the examination.  She has markedly reduced short-term memory (0/5) and reduced focus and attention (100-93-??; DLOW).   speech is normal.  Cranial nerves: Extraocular movements are full. Pupils are equal, round, and reactive to light and accomodation.  Facial symmetry is present. There is good facial sensation to soft touch bilaterally.Facial strength is normal.  Trapezius and sternocleidomastoid strength is normal. No dysarthria is noted.    No obvious hearing deficits are noted.  Motor:  Muscle bulk is normal.   Tone is normal. Strength is  5 / 5 in all 4 extremities.   Sensory: Sensory testing is intact to pinprick, soft touch and vibration sensation in all 4 extremities.  Coordination: Cerebellar testing reveals good finger-nose-finger and heel-to-shin bilaterally.  Gait and station: Station is normal.   Gait is slightly wide.  Tandem gait is wide..  Romberg is negative.   Reflexes: Deep tendon reflexes are symmetric and normal bilaterally.       DIAGNOSTIC DATA (LABS, IMAGING, TESTING) - I reviewed patient records, labs, notes, testing and imaging myself where available.  Lab  Results  Component Value Date   WBC 6.7 05/31/2021   HGB 11.9 (L) 05/31/2021   HCT 36.0 05/31/2021   MCV 89.6 05/31/2021   PLT 238 05/31/2021      Component Value Date/Time   NA 140 06/12/2021 1322   K 3.8 06/12/2021 1322   CL 100 06/12/2021 1322   CO2 31 06/12/2021 1322   GLUCOSE 136 (H) 06/12/2021 1322   BUN 19 06/12/2021 1322   CREATININE 1.17 06/12/2021 1322   CALCIUM 9.7 06/12/2021 1322   PROT 6.4 (L) 05/31/2021 1556   ALBUMIN 3.8 05/31/2021 1556   AST 15 05/31/2021 1556   ALT 14 05/31/2021 1556   ALKPHOS 45 05/31/2021 1556   BILITOT 0.4 05/31/2021 1556   GFRNONAA >60 05/31/2021 1556   Lab Results  Component Value Date   CHOL 200 05/19/2020   HDL 81.70 05/19/2020   LDLCALC 102 (H) 05/19/2020   LDLDIRECT 103.0 04/13/2018   TRIG 80.0 05/19/2020   CHOLHDL 2 05/19/2020   No results found for: HGBA1C Lab Results  Component Value Date   VITAMINB12 368 12/06/2020   Lab Results  Component Value Date   TSH 0.90 10/24/2020       ASSESSMENT AND PLAN  Alzheimer disease ([place])  Depression with anxiety    I discussed with her and her kids that she appears to have moderate Alzheimer's disease at this time.   Increase donepezil to 10 mg.   Continue Namenda  21 mg daily.  No driving 3.    Continue Lexapro 4.    We discussed current living arrangement.   Daughter will be moving in this month.   5.   Rtc 6 months or sooner if new or worsening issues.  Recheck cognition with MMSE at that time.    Aleksa Catterton A. Felecia Shelling, MD, [facility] 09/19/5807, 98:33 PM Certified in Neurology, Clinical Neurophysiology, Sleep Medicine and Neuroimaging  [facility] [address], [place], [place] [address] [phone]

## 2021-09-12 ENCOUNTER — Other Ambulatory Visit
Admission: RE | Admit: 2021-09-12 | Discharge: 2021-09-12 | Disposition: A | Discharge status: Home / Self Care | Payer: Medicare Other | Source: Ambulatory Visit | Attending: Pulmonary Disease | Admitting: Pulmonary Disease

## 2021-09-12 ENCOUNTER — Other Ambulatory Visit: Payer: Self-pay

## 2021-09-12 DIAGNOSIS — E876 Hypokalemia: Secondary | ICD-10-CM

## 2021-09-12 DIAGNOSIS — I10 Essential (primary) hypertension: Secondary | ICD-10-CM

## 2021-09-12 HISTORY — DX: Dyspnea, unspecified: R06.00

## 2021-09-12 HISTORY — DX: Depression, unspecified: F32.A

## 2021-09-12 HISTORY — DX: Pneumonia, unspecified organism: J18.9

## 2021-09-12 NOTE — Patient Instructions (Addendum)
Your procedure is scheduled on: 09/19/21 - Wednesday Report to the Registration Desk on the 1st floor of the [facility]. To find out your arrival time, please call [phone] between 1PM - 3PM on: 09/18/21 - Tuesday Report to [place] on 09/17/21 for Labs/EKG and Covid Test at 9:30 am.  REMEMBER: Instructions that are not followed completely may result in serious medical risk, up to and including death; or upon the discretion of your surgeon and anesthesiologist your surgery may need to be rescheduled.  Do not eat food or drink any fluids after midnight the night before surgery.  No gum chewing, lozengers or hard candies.  TAKE THESE MEDICATIONS THE MORNING OF SURGERY WITH A SIP OF WATER:  - amLODipine (NORVASC) 5 MG tablet - escitalopram (LEXAPRO) 5 MG tablet - memantine (NAMENDA XR) 21 MG CP24 24 hr capsule - donepezil (ARICEPT) 10 MG tablet  One week prior to surgery: Stop Anti-inflammatories (NSAIDS) such as Advil, Aleve, Ibuprofen, Motrin, Naproxen, Naprosyn and Aspirin based products such as Excedrin, Goodys Powder, BC Powder.  Stop ANY OVER THE COUNTER supplements until after surgery.  You may however, continue to take Tylenol if needed for pain up until the day of surgery.  No Alcohol for 24 hours before or after surgery.  No Smoking including e-cigarettes for 24 hours prior to surgery.  No chewable tobacco products for at least 6 hours prior to surgery.  No nicotine patches on the day of surgery.  Do not use any "recreational" drugs for at least a week prior to your surgery.  Please be advised that the combination of cocaine and anesthesia may have negative outcomes, up to and including death. If you test positive for cocaine, your surgery will be cancelled.  On the morning of surgery brush your teeth with toothpaste and water, you may rinse your mouth with mouthwash if you wish. Do not swallow any toothpaste or mouthwash.  Do not wear jewelry, make-up,  hairpins, clips or nail polish.  Do not wear lotions, powders, or perfumes.   Do not shave body from the neck down 48 hours prior to surgery just in case you cut yourself which could leave a site for infection.  Also, freshly shaved skin may become irritated if using the CHG soap.  Contact lenses, hearing aids and dentures may not be worn into surgery.  Do not bring valuables to the hospital. [facility] is not responsible for any missing/lost belongings or valuables.   Notify your doctor if there is any change in your medical condition (cold, fever, infection).  Wear comfortable clothing (specific to your surgery type) to the hospital.  After surgery, you can help prevent lung complications by doing breathing exercises.  Take deep breaths and cough every 1-2 hours. Your doctor may order a device called an Incentive Spirometer to help you take deep breaths. When coughing or sneezing, hold a pillow firmly against your incision with both hands. This is called splinting. Doing this helps protect your incision. It also decreases belly discomfort.  If you are being admitted to the hospital overnight, leave your suitcase in the car. After surgery it may be brought to your room.  If you are being discharged the day of surgery, you will not be allowed to drive home. You will need a responsible adult (18 years or older) to drive you home and stay with you that night.   If you are taking public transportation, you will need to have a responsible adult (18 years  or older) with you. Please confirm with your physician that it is acceptable to use public transportation.   Please call the [facility] at [phone] if you have any questions about these instructions.  Surgery Visitation Policy:  Patients undergoing a surgery or procedure may have one family member or support person with them as long as that person is not COVID-19 positive or experiencing its symptoms.  That  person may remain in the waiting area during the procedure and may rotate out with other people.  Inpatient Visitation:    Visiting hours are 7 a.m. to 8 p.m. Up to two visitors ages 16+ are allowed at one time in a patient room. The visitors may rotate out with other people during the day. Visitors must check out when they leave, or other visitors will not be allowed. One designated support person may remain overnight. The visitor must pass COVID-19 screenings, use hand sanitizer when entering and exiting the patients room and wear a mask at all times, including in the patients room. Patients must also wear a mask when staff or their visitor are in the room. Masking is required regardless of vaccination status.

## 2021-09-17 ENCOUNTER — Encounter: Payer: Self-pay | Admitting: Urgent Care

## 2021-09-17 ENCOUNTER — Ambulatory Visit
Admission: RE | Admit: 2021-09-17 | Discharge: 2021-09-17 | Disposition: A | Discharge status: Home / Self Care | Payer: Medicare Other | Source: Ambulatory Visit | Attending: Pulmonary Disease | Admitting: Pulmonary Disease

## 2021-09-17 ENCOUNTER — Other Ambulatory Visit
Admission: RE | Admit: 2021-09-17 | Discharge: 2021-09-17 | Disposition: A | Discharge status: Home / Self Care | Payer: Medicare Other | Source: Ambulatory Visit | Attending: Pulmonary Disease | Admitting: Pulmonary Disease

## 2021-09-17 ENCOUNTER — Other Ambulatory Visit: Payer: Self-pay

## 2021-09-17 DIAGNOSIS — J439 Emphysema, unspecified: Secondary | ICD-10-CM | POA: Diagnosis not present

## 2021-09-17 DIAGNOSIS — R911 Solitary pulmonary nodule: Secondary | ICD-10-CM | POA: Diagnosis not present

## 2021-09-17 DIAGNOSIS — Z20822 Contact with and (suspected) exposure to covid-19: Secondary | ICD-10-CM | POA: Insufficient documentation

## 2021-09-17 DIAGNOSIS — I10 Essential (primary) hypertension: Secondary | ICD-10-CM

## 2021-09-17 DIAGNOSIS — E876 Hypokalemia: Secondary | ICD-10-CM | POA: Insufficient documentation

## 2021-09-17 DIAGNOSIS — R918 Other nonspecific abnormal finding of lung field: Secondary | ICD-10-CM | POA: Diagnosis not present

## 2021-09-17 DIAGNOSIS — I700 Atherosclerosis of aorta: Secondary | ICD-10-CM | POA: Diagnosis not present

## 2021-09-17 LAB — BASIC METABOLIC PANEL
Anion gap: 7 (ref 5–15)
BUN: 30 mg/dL — ABNORMAL HIGH (ref 8–23)
CO2: 27 mmol/L (ref 22–32)
Calcium: 8.8 mg/dL — ABNORMAL LOW (ref 8.9–10.3)
Chloride: 105 mmol/L (ref 98–111)
Creatinine, Ser: 1 mg/dL (ref 0.44–1.00)
GFR, Estimated: 58 mL/min — ABNORMAL LOW (ref >60)
Glucose, Bld: 96 mg/dL (ref 70–99)
Potassium: 3.1 mmol/L — ABNORMAL LOW (ref 3.5–5.1)
Sodium: 139 mmol/L (ref 135–145)

## 2021-09-17 LAB — CBC
HCT: 39.7 % (ref 36.0–46.0)
Hemoglobin: 13.1 g/dL (ref 12.0–15.0)
MCH: 29.8 pg (ref 26.0–34.0)
MCHC: 33 g/dL (ref 30.0–36.0)
MCV: 90.2 fL (ref 80.0–100.0)
Platelets: 201 10*3/uL (ref 150–400)
RBC: 4.4 MIL/uL (ref 3.87–5.11)
RDW: 12.8 % (ref 11.5–15.5)
WBC: 5 10*3/uL (ref 4.0–10.5)
nRBC: 0 % (ref 0.0–0.2)

## 2021-09-17 LAB — SARS CORONAVIRUS 2 (TAT 6-24 HRS): SARS Coronavirus 2: NEGATIVE

## 2021-09-19 ENCOUNTER — Ambulatory Visit: Admission: RE | Admit: 2021-09-19 | Payer: Medicare Other | Source: Home / Self Care | Admitting: Pulmonary Disease

## 2021-09-19 ENCOUNTER — Encounter: Admission: RE | Payer: Self-pay | Source: Home / Self Care

## 2021-09-19 SURGERY — BRONCHOSCOPY, WITH BIOPSY USING ELECTROMAGNETIC NAVIGATION
Anesthesia: General

## 2021-09-28 ENCOUNTER — Ambulatory Visit: Payer: Medicare Other | Admitting: Family Medicine

## 2021-10-03 ENCOUNTER — Telehealth: Payer: Self-pay | Admitting: Neurology

## 2021-10-03 NOTE — Telephone Encounter (Signed)
Received FMLA paperwork for patient's daughter Guilford Shi in regards to taking care of patient. Received payment for forms from daughter. Paperwork left with Dr. Felecia Shelling to fill out

## 2021-10-04 DIAGNOSIS — Z0289 Encounter for other administrative examinations: Secondary | ICD-10-CM

## 2021-10-04 NOTE — Telephone Encounter (Signed)
LVM for daughter, Tressia Miners to call back to discuss forms.   Wanting to know specifically what she is needing. For example: reduced schedule/intermittent leave/continuous leave? How much she is looking for.

## 2021-10-09 ENCOUNTER — Other Ambulatory Visit: Payer: Self-pay

## 2021-10-09 ENCOUNTER — Encounter: Payer: Self-pay | Admitting: Pulmonary Disease

## 2021-10-09 ENCOUNTER — Ambulatory Visit: Payer: Medicare Other | Admitting: Pulmonary Disease

## 2021-10-09 VITALS — BP 118/74 | HR 94 | Temp 98.0°F | Ht 64.0 in | Wt 135.6 lb

## 2021-10-09 DIAGNOSIS — R911 Solitary pulmonary nodule: Secondary | ICD-10-CM

## 2021-10-09 DIAGNOSIS — F0280 Dementia in other diseases classified elsewhere without behavioral disturbance: Secondary | ICD-10-CM | POA: Diagnosis not present

## 2021-10-09 DIAGNOSIS — G309 Alzheimer's disease, unspecified: Secondary | ICD-10-CM

## 2021-10-09 NOTE — Progress Notes (Signed)
Subjective:    Patient ID: Lauren [name], female    DOB: 1941-03-12, 81 y.o.   MRN: [id number] Chief Complaint  Patient presents with   Follow-up    HPI Patient is a 81 year old very remote former smoker (quit 1994) who presents for follow-up on a right upper lobe nodule.  Nodule first seen on CT of the chest in November at that time measuring 1 cm.  At the time of the patient's initial evaluation here on January 2023 she was totally asymptomatic.  That visit was for consideration for robotic bronchoscopy for diagnosis of this nodule.  He was scheduled for robotic bronchoscopy with a planning CT performed on 23 January, on the planning CT the nodule has been noted to have decreased significantly between 7 to 8 mm, recommendation was then to follow this serially.  The patient has been totally asymptomatic in this regard.  Patient does have some mild early dementia and short-term memory is not good.  Because of the reduction in size of the nodule the post robotic bronchoscopy was then canceled.   Review of Systems A 10 point review of systems was performed and it is as noted above otherwise negative.  Patient Active Problem List   Diagnosis Date Noted   Persistent cough 08/23/2021   Community acquired pneumonia 06/12/2021   Hypokalemia 06/12/2021   Lung nodule 06/12/2021   Alzheimer disease ([place]) 10/30/2020   Depression with anxiety 10/30/2020   Left rotator cuff tear 02/08/2020   Left shoulder pain 01/19/2020   Papule of skin 01/19/2020   Glaucoma 01/19/2020   Allergic rhinitis 11/15/2019   Eustachian tube dysfunction, bilateral 05/22/2019   Family history of colon cancer 05/22/2019   Other bursal cyst, left ankle and foot 10/27/2018   Hx of adenomatous and sessile serrated colonic polyps 09/02/2018   Arthritis of midfoot 05/28/2018   Status post cataract extraction 04/13/2018   Overweight 03/04/2018   Low serum vitamin B12 10/13/2017   Morton's neuroma of third interspace of  left foot 09/22/2017   Loss of transverse plantar arch of left foot 09/22/2017   Pain and swelling of left lower leg 08/20/2017   Memory deficit 07/02/2017   HTN (hypertension) 07/02/2017   Dry skin 08/31/2016   History of shingles 08/31/2016   Left foot pain 05/24/2016   Osteoporosis 02/21/2016   Elevated LDL cholesterol level 11/26/2015   Incontinence of urine in female 10/05/2015   Social History   Tobacco Use   Smoking status: Former    Types: Cigarettes    Quit date: 08/06/1993    Years since quitting: 28.1   Smokeless tobacco: Never  Substance Use Topics   Alcohol use: No    Alcohol/week: 0.0 standard drinks   Allergies  Allergen Reactions   Gabapentin Other (See Comments)    Unable to sleep   Hydrochlorothiazide Other (See Comments)    Leg pain and aches   Hydrocodone-Guaifenesin Other (See Comments)    Cant sleep    Influenza Vaccines Nausea And Vomiting    Nausea and vomiting and stomach pain for 10 weeks   Mobic [Meloxicam] Swelling   Oxybutynin     Memory loss per patient   Sulfa Antibiotics Other (See Comments)    Cold sweats, passing out    Codeine Rash    Cold sweats    Current Meds  Medication Sig   amLODipine (NORVASC) 5 MG tablet TABLET 1 TABLET BY MOUTH EVERY OTHER DAY   donepezil (ARICEPT) 10 MG tablet Take 1  tablet (10 mg total) by mouth at bedtime. (Patient taking differently: Take 10 mg by mouth every morning.)   escitalopram (LEXAPRO) 5 MG tablet Take 1 tablet (5 mg total) by mouth daily.   fluticasone (FLONASE) 50 MCG/ACT nasal spray 1 spray each nostril twice a day until sinus symptoms improved, then 1 time per day for 1 week, then can continue or stop based on if any allergy symptoms also.   latanoprost (XALATAN) 0.005 % ophthalmic solution Place 1 drop into both eyes at bedtime.    memantine (NAMENDA XR) 21 MG CP24 24 hr capsule Take 1 capsule (21 mg total) by mouth daily.   Immunization History  Administered Date(s) Administered    Influenza Inj Mdck Quad Pf 06/18/2019, 05/19/2020   PFIZER(Purple Top)SARS-COV-2 Vaccination 10/16/2019, 11/09/2019, 06/26/2020, 12/10/2020   Pneumococcal Conjugate-13 12/10/2017   Pneumococcal Polysaccharide-23 10/05/2015, 10/14/2016   Tdap 06/09/2020      Objective:   Physical Exam BP 118/74 (BP Location: Left Arm, Patient Position: Sitting, Cuff Size: Normal)    Pulse 94    Temp 98 F (36.7 C) (Oral)    Ht 5\' 4"  (1.626 m)    Wt 135 lb 9.6 oz (61.5 kg)    LMP  (LMP Unknown)    SpO2 94%    BMI 23.28 kg/m  GENERAL: Well-nourished, well-developed, fully ambulatory, no conversational dyspnea. HEAD: Normocephalic, atraumatic.  EYES: Pupils equal, round, reactive to light.  No scleral icterus.  MOUTH: Nose/mouth/throat not examined due to masking requirements for COVID 19. NECK: Supple. No thyromegaly. Trachea midline. No JVD.  No adenopathy. PULMONARY: Good air entry bilaterally.  No adventitious sounds. CARDIOVASCULAR: S1 and S2. Regular rate and rhythm.  ABDOMEN: Benign. MUSCULOSKELETAL: No joint deformity, no clubbing, no edema.  NEUROLOGIC: No focal deficit, no gait disturbance, speech is fluent. SKIN: Intact,warm,dry. PSYCH: Mild memory impairment, normal mood and behavior  Comparison of nodule size between 02 July 2021 (1) and 17 September 2021 (2) on representative images of CT scans:     Assessment & Plan:     ICD-10-CM   1. Lung nodule - RUL 40mm  R91.1 CT CHEST WO CONTRAST   Nodule has reduced in size between November 2022 in January 2023 Patient remains asymptomatic Follow-up CT scan in 3 months    2. Alzheimer's disease ([place])- early dementia  G30.9    F02.80    This issue adds complexity to her management      Orders Placed This Encounter  Procedures   CT CHEST WO CONTRAST    Standing Status:   Future    Standing Expiration Date:   10/09/2022    Order Specific Question:   Preferred imaging location?    Answer:   [facility]   We will see the patient in  follow-up after CT scan of the chest is performed above.  She is to contact us prior to that time should any new difficulties arise.  Renold Don, MD Advanced Bronchoscopy [facility]    *This note was dictated using voice recognition software/Dragon.  Despite best efforts to proofread, errors can occur which can change the meaning. Any transcriptional errors that result from this process are unintentional and may not be fully corrected at the time of dictation.

## 2021-10-09 NOTE — Patient Instructions (Signed)
We will schedule a follow-up CT in 3 months time.  We will see you in follow-up after that is done.  Call sooner should any new problems arise.

## 2021-10-16 NOTE — Telephone Encounter (Signed)
Called and spoke with daughter. She states she called back twice but got transferred both times and had to LVM.   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 Also works at [employer] part time. Unable to work part time at [employer] right now d/t having to care for mother. Mother's sitter gets off at 3pm daily. 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 Requesting continuous FMLA leave starting 10/04/21-02/12/22. Aware I will give to MD to review/complete and call back if any questions arise.She verbalized understanding.

## 2021-10-17 NOTE — Telephone Encounter (Signed)
Gave completed/signed form back to medical records to process for pt. 

## 2021-10-25 ENCOUNTER — Other Ambulatory Visit: Payer: PPO

## 2021-12-10 ENCOUNTER — Telehealth: Payer: Self-pay | Admitting: Pulmonary Disease

## 2021-12-10 NOTE — Telephone Encounter (Signed)
Noted by triage.  ?Will hold message to ensure follow up.  ?

## 2021-12-12 NOTE — Telephone Encounter (Signed)
Appt scheduled 01/29/2022 at 1:30. ?Will close encounter.  ? ?

## 2022-01-09 ENCOUNTER — Ambulatory Visit
Admission: RE | Admit: 2022-01-09 | Discharge: 2022-01-09 | Disposition: A | Discharge status: Home / Self Care | Payer: Medicare Other | Source: Ambulatory Visit | Attending: Pulmonary Disease | Admitting: Pulmonary Disease

## 2022-01-09 DIAGNOSIS — J439 Emphysema, unspecified: Secondary | ICD-10-CM | POA: Diagnosis not present

## 2022-01-09 DIAGNOSIS — R911 Solitary pulmonary nodule: Secondary | ICD-10-CM | POA: Diagnosis not present

## 2022-01-10 ENCOUNTER — Other Ambulatory Visit: Payer: Self-pay

## 2022-01-10 DIAGNOSIS — R911 Solitary pulmonary nodule: Secondary | ICD-10-CM

## 2022-01-18 ENCOUNTER — Ambulatory Visit: Payer: Medicare Other | Admitting: Pulmonary Disease

## 2022-01-29 ENCOUNTER — Ambulatory Visit: Payer: Medicare Other | Admitting: Pulmonary Disease

## 2022-03-11 ENCOUNTER — Ambulatory Visit: Payer: Medicare Other | Admitting: Family Medicine

## 2022-03-11 ENCOUNTER — Encounter: Payer: Self-pay | Admitting: Family Medicine

## 2022-03-11 VITALS — BP 163/70 | HR 81 | Ht 64.0 in | Wt 136.0 lb

## 2022-03-11 DIAGNOSIS — F418 Other specified anxiety disorders: Secondary | ICD-10-CM | POA: Diagnosis not present

## 2022-03-11 DIAGNOSIS — G309 Alzheimer's disease, unspecified: Secondary | ICD-10-CM

## 2022-03-11 DIAGNOSIS — F0280 Dementia in other diseases classified elsewhere without behavioral disturbance: Secondary | ICD-10-CM

## 2022-03-11 NOTE — Progress Notes (Signed)
Chief Complaint  Patient presents with   Follow-up    Rm 1, w daughter and son. Here for 6 month AD f/u. Pt is having more confusion and needing more help. MMSE:15    HISTORY OF PRESENT ILLNESS:  03/11/22 ALL:  Lauren [name] is a 81 y.o. female here today for follow up for AD. She continues donepezil '10mg'$ , memantine XR '21mg'$  and escitalopram '5mg'$  daily. She presents with her daughter and son who aid in history. Her daughter has moved in with her. She has a care giver who helps several days a week when daughter is working. She needs assistance with all ADLs. Her daughter manages home and medications. She is sleeping well. She is taking melatonin. Appetite is good. She does have visual hallucinations at times. She sees dogs and cats but does not seem frightened. No other behavioral concerns.    HISTORY (copied from Dr Garth Bigness previous note)  Lauren [name] is a 82 y.o. woman with Alzheimer's disease.     Update 1/16/203: She states that she feels fine.   She denies much problems.   She is living alone and spends her day doing chores.    Her daughter  notes she is doing worse and provided more details.   She got lost while driving (was in [place] - got lost).   She no longer drives.  She has no concept of time.   Her daughter is going to move in with her.  She needs reminders to [facility] a shower.   She sometimes needs help completely getting dressed.   Family handles finances.   She is tolerating her medications well.  She watched TV and crochets.  Her daughter does that she got some benefit initially with the donepezil but not as much lately.   Mood is better on Lexapro   She was hospitalized with pneumonia and has a ling nodule - getting another CT scan and will need sedation.    Gait is fine.   Bladder function is fine.      She states she is sleeping well and goes to bed around 930 pm and wakes up 630 am.-830 am   She does not wake up at night.    Her daughter notes, however, she sometimes  is still in bed at noon .    Memory loss history: She started to experience cognitive issues in 2020.  She notes she misplaces items.    She is still driving.   She had one episode where she got lost driving.    She used [employer] which helped her get home.   She continues to do household ADLs like cooking but has more trouble with recipes.  Her daughter notices she has trouble with dates and forgets appointments.   She has trouble with names.     She has forgotten to turn the oven off and lock doors.  She started donepezil 5 mg 10/2020 and memantine 02/2021   REVIEW OF SYSTEMS: Out of a complete 14 system review of symptoms, the patient complains only of the following symptoms, memory loss, hallucinations, and all other reviewed systems are negative.   ALLERGIES: Allergies  Allergen Reactions   Gabapentin Other (See Comments)    Unable to sleep   Hydrochlorothiazide Other (See Comments)    Leg pain and aches   Hydrocodone-Guaifenesin Other (See Comments)    Cant sleep    Influenza Vaccines Nausea And Vomiting    Nausea and vomiting and stomach pain for  10 weeks   Mobic [Meloxicam] Swelling   Oxybutynin     Memory loss per patient   Sulfa Antibiotics Other (See Comments)    Cold sweats, passing out    Codeine Rash    Cold sweats      HOME MEDICATIONS: Outpatient Medications Prior to Visit  Medication Sig Dispense Refill   amLODipine (NORVASC) 5 MG tablet TABLET 1 TABLET BY MOUTH EVERY OTHER DAY 90 tablet 2   donepezil (ARICEPT) 10 MG tablet Take 1 tablet (10 mg total) by mouth at bedtime. (Patient taking differently: Take 10 mg by mouth every morning.) 90 tablet 4   escitalopram (LEXAPRO) 5 MG tablet Take 1 tablet (5 mg total) by mouth daily. 90 tablet 3   latanoprost (XALATAN) 0.005 % ophthalmic solution Place 1 drop into both eyes at bedtime.   1   memantine (NAMENDA XR) 21 MG CP24 24 hr capsule Take 1 capsule (21 mg total) by mouth daily. 90 capsule 3   fluticasone (FLONASE) 50  MCG/ACT nasal spray 1 spray each nostril twice a day until sinus symptoms improved, then 1 time per day for 1 week, then can continue or stop based on if any allergy symptoms also. 48 g 0   Facility-Administered Medications Prior to Visit  Medication Dose Route Frequency Provider Last Rate Last Admin   betamethasone acetate-betamethasone sodium phosphate (CELESTONE) injection 3 mg  3 mg Intramuscular Once Edrick Kins, DPM         PAST MEDICAL HISTORY: Past Medical History:  Diagnosis Date   Allergic rhinitis    Allergy    Cataract    removed by surgery   Cecal angiodysplasia 11/2019   Chickenpox    Depression    Dyspnea    Elevated LDL cholesterol level 11/26/2015   no meds, diet controlled   Gallstones    removed by surgery   Glaucoma    HTN (hypertension)    Hx of adenomatous and sessile serrated colonic polyps 09/02/2018   Incontinence of urine in female 10/05/2015   Kidney stones    removed with gallbladder surgery per patient   Migraines    Pneumonia    Urinary incontinence      PAST SURGICAL HISTORY: Past Surgical History:  Procedure Laterality Date   CHOLECYSTECTOMY  1995   COLONOSCOPY  08/27/2018   TA polyps/ [facility]   EYE SURGERY Bilateral    cataracts removed/laser   SACRAL NERVE STIMULATOR PLACEMENT  04/2018   To control overactive bladder   TONSILLECTOMY  1962   TUBAL LIGATION       FAMILY HISTORY: Family History  Problem Relation Age of Onset   Hematuria Father    Heart disease Father    Arthritis Father    Heart disease Mother    COPD Mother    Atrial fibrillation Mother    Hypertension Mother    Colon cancer Daughter    Rectal cancer Neg Hx    Stomach cancer Neg Hx    Esophageal cancer Neg Hx      SOCIAL HISTORY: Social History   Socioeconomic History   Marital status: Widowed    Spouse name: Not on file   Number of children: 2   Years of education: Not on file   Highest education level: Not on file  Occupational History    Occupation: retired  Tobacco Use   Smoking status: Former    Types: Cigarettes    Quit date: 08/06/1993    Years since quitting: 2.6  Smokeless tobacco: Never  Vaping Use   Vaping Use: Never used  Substance and Sexual Activity   Alcohol use: No    Alcohol/week: 0.0 standard drinks of alcohol   Drug use: No   Sexual activity: Not Currently    Birth control/protection: Post-menopausal  Other Topics Concern   Not on file  Social History Narrative   Right handed   Widowed   1 son and 1 daughter   She is a retired Radiation protection practitioner   Former smoker   No alcohol or caffeine or drugs or any tobacco   Social Determinants of Radio broadcast assistant Strain: Low Risk  (12/10/2017)   Overall Financial Resource Strain (CARDIA)    Difficulty of Paying Living Expenses: Not hard at all  Food Insecurity: No Food Insecurity (12/10/2017)   Hunger Vital Sign    Worried About Running Out of Food in the Last Year: Never true    [place] in the Last Year: Never true  Transportation Needs: No Transportation Needs (12/10/2017)   PRAPARE - Hydrologist (Medical): No    Lack of Transportation (Non-Medical): No  Physical Activity: Not on file  Stress: Not on file  Social Connections: Unknown (12/10/2017)   Social Connection and Isolation Panel [NHANES]    Frequency of Communication with Friends and Family: Not on file    Frequency of Social Gatherings with Friends and Family: Not on file    Attends Religious Services: Not on file    Active Member of Clubs or Organizations: Not on file    Attends Archivist Meetings: Not on file    Marital Status: Widowed  Intimate Partner Violence: Unknown (12/10/2017)   Humiliation, Afraid, Rape, and Kick questionnaire    Fear of Current or Ex-Partner: Not asked    Emotionally Abused: Not on file    Physically Abused: Not on file    Sexually Abused: Not on file     PHYSICAL EXAM  Vitals:   03/11/22 0921  BP: (!)  163/70  Pulse: 81  Weight: 136 lb (61.7 kg)  Height: '5\' 4"'$  (1.626 m)   Body mass index is 23.34 kg/m.  Generalized: Well developed, in no acute distress  Cardiology: normal rate and rhythm, no murmur auscultated  Respiratory: clear to auscultation bilaterally    Neurological examination  Mentation: Alert oriented to time, place, history taking. Follows all commands speech and language fluent Cranial nerve II-XII: Pupils were equal round reactive to light. Extraocular movements were full, visual field were full on confrontational test. Facial sensation and strength were normal.  Head turning and shoulder shrug  were normal and symmetric. Motor: The motor testing reveals 5 over 5 strength of all 4 extremities. Good symmetric motor tone is noted throughout.  Gait and station: Gait is normal.    DIAGNOSTIC DATA (LABS, IMAGING, TESTING) - I reviewed patient records, labs, notes, testing and imaging myself where available.  Lab Results  Component Value Date   WBC 5.0 09/17/2021   HGB 13.1 09/17/2021   HCT 39.7 09/17/2021   MCV 90.2 09/17/2021   PLT 201 09/17/2021      Component Value Date/Time   NA 139 09/17/2021 0831   K 3.1 (L) 09/17/2021 0831   CL 105 09/17/2021 0831   CO2 27 09/17/2021 0831   GLUCOSE 96 09/17/2021 0831   BUN 30 (H) 09/17/2021 0831   CREATININE 1.00 09/17/2021 0831   CALCIUM 8.8 (L) 09/17/2021 0831   PROT  6.4 (L) 05/31/2021 1556   ALBUMIN 3.8 05/31/2021 1556   AST 15 05/31/2021 1556   ALT 14 05/31/2021 1556   ALKPHOS 45 05/31/2021 1556   BILITOT 0.4 05/31/2021 1556   GFRNONAA 58 (L) 09/17/2021 0831   Lab Results  Component Value Date   CHOL 200 05/19/2020   HDL 81.70 05/19/2020   LDLCALC 102 (H) 05/19/2020   LDLDIRECT 103.0 04/13/2018   TRIG 80.0 05/19/2020   CHOLHDL 2 05/19/2020   No results found for: "HGBA1C" Lab Results  Component Value Date   VITAMINB12 368 12/06/2020   Lab Results  Component Value Date   TSH 0.90 10/24/2020        03/11/2022    9:35 AM 09/27/2020    2:25 PM 07/02/2017    3:37 PM  MMSE - Mini Mental State Exam  Orientation to time '1 5 5  '$ Orientation to Place '4 5 5  '$ Registration '3 3 3  '$ Attention/ Calculation '1 3 3  '$ Recall '1 1 3  '$ Language- name 2 objects '2 2 2  '$ Language- repeat 0 1 1  Language- follow 3 step command '1 2 3  '$ Language- read & follow direction '1 1 1  '$ Write a sentence '1 1 1  '$ Copy design 0 1 1  Total score '15 25 28        '$ 09/10/2021    8:24 AM 03/01/2021    9:31 PM 03/01/2021    8:26 AM 10/30/2020    8:22 AM  Montreal Cognitive Assessment   Visuospatial/ Executive (0/5) 0 1 0 2  Naming (0/3) '3 2 2 3  '$ Attention: Read list of digits (0/2) 1 1 0 2  Attention: Read list of letters (0/1) 0 0 0 0  Attention: Serial 7 subtraction starting at 100 (0/3) 0 '1 1 2  '$ Language: Repeat phrase (0/2) 0 1 0 2  Language : Fluency (0/1) 0 0 0 0  Abstraction (0/2) '1 2 2 2  '$ Delayed Recall (0/5) 0 0 0 0  Orientation (0/6) '3 4 2 6  '$ Total '8 12 7 19  '$ Adjusted Score (based on education)  12  19     ASSESSMENT AND PLAN  81 y.o. year old female  has a past medical history of Allergic rhinitis, Allergy, Cataract, Cecal angiodysplasia (11/2019), Chickenpox, Depression, Dyspnea, Elevated LDL cholesterol level (11/26/2015), Gallstones, Glaucoma, HTN (hypertension), Hx of adenomatous and sessile serrated colonic polyps (09/02/2018), Incontinence of urine in female (10/05/2015), Kidney stones, Migraines, Pneumonia, and Urinary incontinence. here with   Alzheimer disease [facility])  Depression with anxiety  Allsion JIAH BARI is doing fairly well. She will continue donepezil '10mg'$ , memantine XR '21mg'$  and escitalopram '5mg'$  daily. She does not drive. Safety precautions discussed with family. Memory compensation strategies reviewed. Healthy lifestyle habits encouraged. She will follow up with PCP as directed. She will return to see me in 6 months, sooner if needed. She verbalizes understanding and agreement with this plan.   No  orders of the defined types were placed in this encounter.    No orders of the defined types were placed in this encounter.    Debbora Presto, MSN, FNP-C 03/11/2022, 12:26 PM  [facility] [address], [place], [place] [address] [phone]

## 2022-03-11 NOTE — Patient Instructions (Signed)
Below is our plan:  We will continue donepezil '10mg'$  daily and memantine XR '21mg'$  daily.   Please make sure you are staying well hydrated. I recommend 50-60 ounces daily. Well balanced diet and regular exercise encouraged. Consistent sleep schedule with 6-8 hours recommended.   Please continue follow up with care team as directed.   Follow up with me in 6 months  You may receive a survey regarding today's visit. I encourage you to leave honest feed back as I do use this information to improve patient care. Thank you for seeing me today!   Management of Memory Problems   There are some general things you can do to help manage your memory problems.  Your memory may not in fact recover, but by using techniques and strategies you will be able to manage your memory difficulties better.   1)  Establish a routine. Try to establish and then stick to a regular routine.  By doing this, you will get used to what to expect and you will reduce the need to rely on your memory.  Also, try to do things at the same time of day, such as taking your medication or checking your calendar first thing in the morning. Think about think that you can do as a part of a regular routine and make a list.  Then enter them into a daily planner to remind you.  This will help you establish a routine.   2)  Organize your environment. Organize your environment so that it is uncluttered.  Decrease visual stimulation.  Place everyday items such as keys or cell phone in the same place every day (ie.  Basket next to front door) Use post it notes with a brief message to yourself (ie. Turn off light, lock the door) Use labels to indicate where things go (ie. Which cupboards are for food, dishes, etc.) Keep a notepad and pen by the telephone to take messages   3)  Memory Aids A diary or journal/notebook/daily planner Making a list (shopping list, chore list, to do list that needs to be done) Using an alarm as a reminder (kitchen  timer or cell phone alarm) Using cell phone to store information (Notes, Calendar, Reminders) Calendar/White board placed in a prominent position Post-it notes   In order for memory aids to be useful, you need to have good habits.  It's no good remembering to make a note in your journal if you don't remember to look in it.  Try setting aside a certain time of day to look in journal.   4)  Improving mood and managing fatigue. There may be other factors that contribute to memory difficulties.  Factors, such as anxiety, depression and tiredness can affect memory. Regular gentle exercise can help improve your mood and give you more energy. Simple relaxation techniques may help relieve symptoms of anxiety Try to get back to completing activities or hobbies you enjoyed doing in the past. Learn to pace yourself through activities to decrease fatigue. Find out about some local support groups where you can share experiences with others. Try and achieve 7-8 hours of sleep at night.

## 2022-04-04 ENCOUNTER — Telehealth: Payer: Self-pay | Admitting: Family Medicine

## 2022-04-04 NOTE — Telephone Encounter (Signed)
Spoke with pt's daughter, Tressia Miners over the phone and informed her of need to reschedule 1/15 appointment. [name] stated that she will have pt's son call and reschedule appointment - Amy out

## 2022-05-08 ENCOUNTER — Telehealth: Payer: Self-pay | Admitting: Family Medicine

## 2022-05-08 NOTE — Telephone Encounter (Signed)
Called daughter back. She states she already spoke with PCP who directed her to have neurology fill out. Aware we will complete and call her once ready for pick up. She verbalized understanding.

## 2022-05-08 NOTE — Telephone Encounter (Signed)
Pt daughter Tressia Miners) is calling. Stated she needed a FL2 from Dr. Felecia Shelling to put Pt in a memory care home. Tressia Miners is requesting a call-back from the nurse

## 2022-05-08 NOTE — Telephone Encounter (Signed)
Amy, you last saw pt. Are you ok with filling out FL2 for pt?

## 2022-05-13 NOTE — Telephone Encounter (Signed)
Gave completed/signed FL2 form back to medical records to process for pt.

## 2022-05-14 ENCOUNTER — Telehealth: Payer: Self-pay | Admitting: *Deleted

## 2022-05-14 NOTE — Telephone Encounter (Signed)
Pt FL2 form ready for p/u

## 2022-06-13 ENCOUNTER — Telehealth: Payer: Self-pay | Admitting: Family Medicine

## 2022-06-13 NOTE — Telephone Encounter (Signed)
Called the center back and asked for [facility]. She was gone for the day. Advised we would be out of the office tomorrow but back on Monday if she would like to call back.

## 2022-06-13 NOTE — Telephone Encounter (Signed)
Lauren [name] is calling. Said she had some question about the FL2. Requesting a nurse call her back.

## 2022-06-17 ENCOUNTER — Telehealth: Payer: Self-pay | Admitting: Family Medicine

## 2022-06-17 NOTE — Telephone Encounter (Signed)
Took call from phone staff and spoke w/ Judson Roch. She states pt moving into their facility.  Needing to confirm directions of latanoprost 0.005%. Advised per our records, we have 1 drop in both eyes at bedtime.   They also have lantanoprost 0.008%. Advised we do not prescribe. Would be best for them to reach out to PCP to verify if needed.  I also went through pt allergy list. Nothing further needed.

## 2022-06-17 NOTE — Telephone Encounter (Signed)
Sarah from [facility], [phone], fax [phone]. Patient is not sure if Dr Caryl Bis still wants her to take the latanoprost (XALATAN) 0.005 % ophthalmic solution, if so please fax.  Also, needs order for artifical tears and Melatonin, patient taking 2 1/1 pills at night, '5mg'$  per pill.   Judson Roch will also fax these requests.

## 2022-06-17 NOTE — Telephone Encounter (Signed)
Order was given to the provider to be signed.

## 2022-06-17 NOTE — Telephone Encounter (Signed)
Noted. The xalatan should be prescribed by an ophthalmologist. That refill needs to come from her ophthalmologist. Please creat a hand written order for the artificial tears and melatonin and then I can fill in the prescription info and you can fax it.

## 2022-06-18 NOTE — Telephone Encounter (Signed)
Signed.

## 2022-06-25 ENCOUNTER — Other Ambulatory Visit: Payer: Self-pay | Admitting: Family Medicine

## 2022-06-25 ENCOUNTER — Telehealth: Payer: Self-pay

## 2022-06-25 NOTE — Telephone Encounter (Signed)
Spoke with Pts daughter about pt needing an appt to get more refills on her Bp medicines pt has not been seen since 2022   Pts daughter advised I call her brother and that I could leave a msg for pt to get scheduled with Dr. Caryl Bis   I left a detailed msg on his machine   I will be sending in atleast a months worth of the medication but pt needs to be seen for any more.

## 2022-07-09 ENCOUNTER — Telehealth: Payer: Self-pay | Admitting: Family Medicine

## 2022-07-09 NOTE — Telephone Encounter (Signed)
Lauren [name] from [facility] called. She faxed over a packet on 07/04/2022. She really needs that back asap, patient is being moved to the Memory Care unit. Phone number is [phone], Fax # is [phone].

## 2022-07-10 NOTE — Telephone Encounter (Signed)
I have not received the packet that Lauren [name] was speaking of, I called and spoke with her and she is refaxing the packet for the patient to give to the provider.  [name],cma

## 2022-07-15 NOTE — Telephone Encounter (Signed)
Lauren [name] called stating she has faxed an FL2 form to provider and she has not received it yet. Lauren [name] states she need it today

## 2022-07-16 ENCOUNTER — Telehealth: Payer: Self-pay

## 2022-07-16 NOTE — Telephone Encounter (Signed)
Signed. Please fax.

## 2022-07-16 NOTE — Telephone Encounter (Signed)
Faxed. Confirmation given.  Jisell Majer,cma

## 2022-07-16 NOTE — Telephone Encounter (Signed)
Paperwork was faxed on 07/16/22 at 1:35 pm

## 2022-07-16 NOTE — Telephone Encounter (Signed)
Huston Foley states she needs Dr. Tommi Rumps to sign [facility] for patient no later than today.  Fax:  [phone]

## 2022-07-25 ENCOUNTER — Other Ambulatory Visit: Payer: Self-pay | Admitting: Family Medicine

## 2022-07-28 NOTE — Telephone Encounter (Signed)
Pt must keep appt on 08/12/22. Than a 90 day supply can be sent in at appt.

## 2022-08-06 ENCOUNTER — Ambulatory Visit: Payer: Medicare Other | Admitting: Family Medicine

## 2022-08-12 ENCOUNTER — Ambulatory Visit (INDEPENDENT_AMBULATORY_CARE_PROVIDER_SITE_OTHER): Payer: Medicare Other | Admitting: Family Medicine

## 2022-08-12 VITALS — BP 130/70 | HR 82 | Temp 97.9°F | Ht 64.0 in | Wt 137.0 lb

## 2022-08-12 DIAGNOSIS — J309 Allergic rhinitis, unspecified: Secondary | ICD-10-CM

## 2022-08-12 DIAGNOSIS — M810 Age-related osteoporosis without current pathological fracture: Secondary | ICD-10-CM | POA: Diagnosis not present

## 2022-08-12 DIAGNOSIS — F0280 Dementia in other diseases classified elsewhere without behavioral disturbance: Secondary | ICD-10-CM

## 2022-08-12 DIAGNOSIS — G309 Alzheimer's disease, unspecified: Secondary | ICD-10-CM

## 2022-08-12 DIAGNOSIS — I10 Essential (primary) hypertension: Secondary | ICD-10-CM

## 2022-08-12 MED ORDER — AMLODIPINE BESYLATE 5 MG PO TABS: 5.0000 mg | ORAL_TABLET | ORAL | 3 refills | Status: DC

## 2022-08-12 MED ORDER — AZELASTINE HCL 0.1 % NA SOLN: 2.0000 | Freq: Two times a day (BID) | NASAL | 12 refills | Status: AC

## 2022-08-12 MED ORDER — AZELASTINE HCL 0.1 % NA SOLN: 2.0000 | Freq: Two times a day (BID) | NASAL | 12 refills | Status: DC

## 2022-08-12 NOTE — Assessment & Plan Note (Signed)
Trial of Astelin nasal spray 2 sprays each nostril twice daily.

## 2022-08-12 NOTE — Progress Notes (Signed)
Tommi Rumps, MD Phone: [phone]  Lauren [name] is a 81 y.o. female who presents today for follow-up.  Osteoporosis: Patient did receive Prolia initially for her osteoporosis though has not received this in over a year.  She notes no fractures.  She is not taking vitamin D.  Hypertension: Not checking blood pressures.  She is taking amlodipine.  No chest pain, shortness of breath, or edema.  Dementia: Patient reports her memory is okay though her daughter notes it is not great.  She is on Namenda and Aricept and does follow with neurology.  Rhinorrhea: Patient has chronic issues with rhinorrhea.  They note her nose just drips.  No sneezing or coughing.  She does have a history of allergies.  Social History   Tobacco Use  Smoking Status Former   Types: Cigarettes   Quit date: 08/06/1993   Years since quitting: 29.0  Smokeless Tobacco Never    Current Outpatient Medications on File Prior to Visit  Medication Sig Dispense Refill   donepezil (ARICEPT) 10 MG tablet Take 1 tablet (10 mg total) by mouth at bedtime. (Patient taking differently: Take 10 mg by mouth every morning.) 90 tablet 4   escitalopram (LEXAPRO) 5 MG tablet Take 1 tablet (5 mg total) by mouth daily. 90 tablet 3   latanoprost (XALATAN) 0.005 % ophthalmic solution Place 1 drop into both eyes at bedtime.   1   memantine (NAMENDA XR) 21 MG CP24 24 hr capsule Take 1 capsule (21 mg total) by mouth daily. 90 capsule 3   Current Facility-Administered Medications on File Prior to Visit  Medication Dose Route Frequency Provider Last Rate Last Admin   betamethasone acetate-betamethasone sodium phosphate (CELESTONE) injection 3 mg  3 mg Intramuscular Once Edrick Kins, DPM         ROS see history of present illness  Objective  Physical Exam Vitals:   08/12/22 1534  BP: 130/70  Pulse: 82  Temp: 97.9 F (36.6 C)  SpO2: 95%    BP Readings from Last 3 Encounters:  08/12/22 130/70  03/11/22 (!) 163/70   10/09/21 118/74   Wt Readings from Last 3 Encounters:  08/12/22 137 lb (62.1 kg)  03/11/22 136 lb (61.7 kg)  10/09/21 135 lb 9.6 oz (61.5 kg)    Physical Exam Constitutional:      General: She is not in acute distress.    Appearance: She is not diaphoretic.  HENT:     Nose:     Comments: Some mild rhinorrhea bilateral nares Cardiovascular:     Rate and Rhythm: Normal rate and regular rhythm.     Heart sounds: Normal heart sounds.  Pulmonary:     Effort: Pulmonary effort is normal.     Breath sounds: Normal breath sounds.  Skin:    General: Skin is warm and dry.  Neurological:     Mental Status: She is alert.      Assessment/Plan: Please see individual problem list.  Hypertension, unspecified type Assessment & Plan: Well-controlled.  Continue amlodipine 5 mg every other day.  Orders: -     Comprehensive metabolic panel -     Lipid panel -     amLODIPine Besylate; Take 1 tablet (5 mg total) by mouth every other day.  Dispense: 45 tablet; Refill: 3  Allergic rhinitis, unspecified seasonality, unspecified trigger Assessment & Plan: Trial of Astelin nasal spray 2 sprays each nostril twice daily.  Orders: -     Azelastine HCl; Place 2 sprays into both nostrils  2 (two) times daily. Use in each nostril as directed  Dispense: 30 mL; Refill: 12  Age-related osteoporosis without current pathological fracture Assessment & Plan: She is due for bone density scan.  Will check vitamin D and calcium levels.  Once her bone density scan returns we will determine if she is good to go back on Prolia.  Discussed we would need to get this approved by her insurance again.  Orders: -     DG Bone Density; Future -     VITAMIN D 25 Hydroxy (Vit-D Deficiency, Fractures)  Alzheimer disease ([place]) Assessment & Plan: Chronic issue.  She will continue Aricept 10 mg daily and Namenda 21 mg daily.     Return in about 6 months (around 02/11/2023).   Tommi Rumps, MD [place]

## 2022-08-12 NOTE — Assessment & Plan Note (Signed)
Chronic issue.  She will continue Aricept 10 mg daily and Namenda 21 mg daily.

## 2022-08-12 NOTE — Assessment & Plan Note (Signed)
Well-controlled.  Continue amlodipine 5 mg every other day.

## 2022-08-12 NOTE — Assessment & Plan Note (Signed)
She is due for bone density scan.  Will check vitamin D and calcium levels.  Once her bone density scan returns we will determine if she is good to go back on Prolia.  Discussed we would need to get this approved by her insurance again.

## 2022-08-13 LAB — COMPREHENSIVE METABOLIC PANEL
ALT: 12 U/L (ref 0–35)
AST: 18 U/L (ref 0–37)
Albumin: 4.3 g/dL (ref 3.5–5.2)
Alkaline Phosphatase: 76 U/L (ref 39–117)
BUN: 22 mg/dL (ref 6–23)
CO2: 33 mEq/L — ABNORMAL HIGH (ref 19–32)
Calcium: 9.6 mg/dL (ref 8.4–10.5)
Chloride: 101 mEq/L (ref 96–112)
Creatinine, Ser: 1.06 mg/dL (ref 0.40–1.20)
GFR: 49.9 mL/min — ABNORMAL LOW (ref >60.00)
Glucose, Bld: 89 mg/dL (ref 70–99)
Potassium: 4 mEq/L (ref 3.5–5.1)
Sodium: 142 mEq/L (ref 135–145)
Total Bilirubin: 0.4 mg/dL (ref 0.2–1.2)
Total Protein: 6.8 g/dL (ref 6.0–8.3)

## 2022-08-13 LAB — LIPID PANEL
Cholesterol: 207 mg/dL — ABNORMAL HIGH (ref 0–200)
HDL: 95 mg/dL (ref >39.00)
LDL Cholesterol: 97 mg/dL (ref 0–99)
NonHDL: 112.16
Total CHOL/HDL Ratio: 2
Triglycerides: 74 mg/dL (ref 0.0–149.0)
VLDL: 14.8 mg/dL (ref 0.0–40.0)

## 2022-08-13 LAB — VITAMIN D 25 HYDROXY (VIT D DEFICIENCY, FRACTURES): VITD: 30.79 ng/mL (ref 30.00–100.00)

## 2022-08-24 ENCOUNTER — Other Ambulatory Visit: Payer: Self-pay | Admitting: Family Medicine

## 2022-08-24 DIAGNOSIS — I10 Essential (primary) hypertension: Secondary | ICD-10-CM

## 2022-09-03 ENCOUNTER — Telehealth: Payer: Self-pay | Admitting: Family Medicine

## 2022-09-03 NOTE — Telephone Encounter (Signed)
I called and spoke with the patients daughter and she stated the patient has a odor when urinating and lower back pain and she seems really confused.  Shamon Cothran,cma

## 2022-09-03 NOTE — Telephone Encounter (Signed)
Prescription signed.  I need to know what kind of symptoms she is having that could indicate a UTI.  Are they able to collect a urine on her?

## 2022-09-03 NOTE — Telephone Encounter (Signed)
Pt daughter called stating pt is at [facility] and need the provider to write an order that says pt can take tylenol for pain medication and also the nurse there may think the pt has a UTI and would like medication called in

## 2022-09-04 NOTE — Telephone Encounter (Signed)
Order was faxed to [facility] and confirmation was given.  [place],cma

## 2022-09-04 NOTE — Telephone Encounter (Signed)
Please call her facility and see if they can collect a UA and urine culture. I would like this done prior to her starting on antibiotics. Thanks.

## 2022-09-04 NOTE — Telephone Encounter (Signed)
I called and spoke with the patients nurse at [facility] and they stated they needed a order to collect the urine and they want it faxed to them. I placed the prescription pad note in the sign basket.   Yao [place],cma

## 2022-09-04 NOTE — Telephone Encounter (Signed)
Signed. Please fax to them.

## 2022-09-06 ENCOUNTER — Other Ambulatory Visit: Payer: Self-pay

## 2022-09-06 ENCOUNTER — Other Ambulatory Visit: Payer: Medicare Other

## 2022-09-06 ENCOUNTER — Other Ambulatory Visit (INDEPENDENT_AMBULATORY_CARE_PROVIDER_SITE_OTHER): Payer: Medicare Other

## 2022-09-06 DIAGNOSIS — R309 Painful micturition, unspecified: Secondary | ICD-10-CM | POA: Diagnosis not present

## 2022-09-06 DIAGNOSIS — R300 Dysuria: Secondary | ICD-10-CM

## 2022-09-06 LAB — POCT URINALYSIS DIPSTICK
Bilirubin, UA: NEGATIVE
Blood, UA: NEGATIVE
Glucose, UA: NEGATIVE
Ketones, UA: NEGATIVE
Leukocytes, UA: NEGATIVE
Nitrite, UA: NEGATIVE
Protein, UA: NEGATIVE
Spec Grav, UA: >=1.03 — AB (ref 1.010–1.025)
Urobilinogen, UA: 0.2 E.U./dL
pH, UA: 5 (ref 5.0–8.0)

## 2022-09-06 NOTE — Addendum Note (Signed)
Addended by: Leeanne Rio on: 09/06/2022 04:38 PM   Modules accepted: Orders

## 2022-09-08 LAB — URINE CULTURE
MICRO NUMBER:: 14424867
SPECIMEN QUALITY:: ADEQUATE

## 2022-09-09 ENCOUNTER — Other Ambulatory Visit: Payer: Self-pay | Admitting: Family Medicine

## 2022-09-09 ENCOUNTER — Ambulatory Visit: Payer: Medicare Other | Admitting: Family Medicine

## 2022-09-09 MED ORDER — CEPHALEXIN 500 MG PO CAPS: 500.0000 mg | ORAL_CAPSULE | Freq: Four times a day (QID) | ORAL | 0 refills | Status: DC

## 2022-09-23 ENCOUNTER — Other Ambulatory Visit: Payer: Self-pay | Admitting: Neurology

## 2022-10-27 ENCOUNTER — Other Ambulatory Visit: Payer: Self-pay | Admitting: Neurology

## 2022-10-28 NOTE — Telephone Encounter (Signed)
Last seen on 09/10/2021 no 6 month follow up scheduled.

## 2022-11-08 DIAGNOSIS — G309 Alzheimer's disease, unspecified: Secondary | ICD-10-CM | POA: Diagnosis not present

## 2022-11-08 DIAGNOSIS — B351 Tinea unguium: Secondary | ICD-10-CM | POA: Diagnosis not present

## 2022-11-08 DIAGNOSIS — M722 Plantar fascial fibromatosis: Secondary | ICD-10-CM | POA: Diagnosis not present

## 2022-11-08 DIAGNOSIS — L600 Ingrowing nail: Secondary | ICD-10-CM | POA: Diagnosis not present

## 2022-11-08 DIAGNOSIS — I872 Venous insufficiency (chronic) (peripheral): Secondary | ICD-10-CM | POA: Diagnosis not present

## 2022-11-08 DIAGNOSIS — R238 Other skin changes: Secondary | ICD-10-CM | POA: Diagnosis not present

## 2022-11-08 DIAGNOSIS — R262 Difficulty in walking, not elsewhere classified: Secondary | ICD-10-CM | POA: Diagnosis not present

## 2022-11-08 DIAGNOSIS — M6281 Muscle weakness (generalized): Secondary | ICD-10-CM | POA: Diagnosis not present

## 2022-11-28 ENCOUNTER — Other Ambulatory Visit: Payer: Self-pay | Admitting: Family Medicine

## 2022-12-20 ENCOUNTER — Telehealth: Payer: Self-pay | Admitting: Family Medicine

## 2022-12-20 NOTE — Telephone Encounter (Signed)
jennifer from [facility] called stating pt has a possible UTI and sundowning

## 2022-12-20 NOTE — Telephone Encounter (Signed)
Please get more details on what specific symptoms she is having. You can give verbal orders for a UA and culture.

## 2022-12-20 NOTE — Telephone Encounter (Signed)
Victorino Dike is not in the office, spoke with Andrey Campanile & a verbal was given for  UA & culture.

## 2022-12-23 ENCOUNTER — Other Ambulatory Visit: Payer: Self-pay | Admitting: *Deleted

## 2022-12-23 DIAGNOSIS — R300 Dysuria: Secondary | ICD-10-CM

## 2022-12-23 NOTE — Telephone Encounter (Signed)
Victorino Dike called back in and stated they need an order put in so that the [facility] driver has the physical order.  Callback number [phone].

## 2022-12-23 NOTE — Telephone Encounter (Signed)
Order placed for [facility] & Andrey Campanile is aware that it will be faxed. Sent via e-fax

## 2022-12-23 NOTE — Telephone Encounter (Addendum)
Pt daughter called stating they need an updated FL2 form so that are able to get patient in a home

## 2022-12-24 NOTE — Telephone Encounter (Signed)
Noted. Please fill out as much as you can on the [facility] form and if needed we can have Morrie Sheldon help as well.

## 2022-12-25 ENCOUNTER — Telehealth: Payer: Self-pay

## 2022-12-25 NOTE — Telephone Encounter (Signed)
Patient's daughter, Lauren [name], is calling back to follow-up on FL2 form request.  Lauren [name] states they are kind of in a time crunch on this because it costs $9,000/month where her mom is right now.  [name] states they are trying to get patient in a facility that will accept Medicare and Medicaid.

## 2022-12-26 NOTE — Telephone Encounter (Signed)
Filled out FL2 form and placed in Dr. Purvis Sheffield needs to be signed basket

## 2022-12-27 NOTE — Telephone Encounter (Signed)
Left Lauren [name] Patient's daughter a message to call back to see if she would like to pick this FL2 form up or where do I need to fax it?

## 2022-12-27 NOTE — Telephone Encounter (Signed)
Reviewed and signed

## 2022-12-30 NOTE — Telephone Encounter (Signed)
FL2 form placed in Patient pick up folder in front office for the son to pick up

## 2022-12-30 NOTE — Telephone Encounter (Signed)
Left message to call back regarding if the FL2 is going to be picked up or do we fax it if so where do we fax it?

## 2023-01-03 ENCOUNTER — Telehealth: Payer: Self-pay | Admitting: Pulmonary Disease

## 2023-01-03 NOTE — Telephone Encounter (Signed)
Dr. Jayme Cloud you had seen this patient 09/06/21 order Super D CT done 09/17/21, seen again 10/09/21 order Chest CT done 01/09/22 with 1 year follow up CT. I just tried calling the patient her daughter answered the phone. French Ana stated that Lauren [name] has been admitted to [facility] and she is healthy right now. French Ana didn't want to schedule the CT.

## 2023-01-03 NOTE — Telephone Encounter (Signed)
That is totally reasonable since the nodule was getting smaller.  She was starting to have very severe issues with memory.  We can certainly cancel the CT.

## 2023-01-06 ENCOUNTER — Telehealth: Payer: Self-pay | Admitting: Family Medicine

## 2023-01-06 ENCOUNTER — Other Ambulatory Visit: Payer: Self-pay

## 2023-01-06 DIAGNOSIS — R300 Dysuria: Secondary | ICD-10-CM

## 2023-01-06 NOTE — Telephone Encounter (Signed)
[facility] Dike from [facility], [phone] and Fax # [phone]. Please send Lab orders for UA to be sent to [facility].

## 2023-01-06 NOTE — Telephone Encounter (Signed)
Printed and faxed UA orders to [phone].

## 2023-01-06 NOTE — Telephone Encounter (Signed)
Ok confirmation received.

## 2023-01-07 NOTE — Telephone Encounter (Signed)
I have CXL the CT order per Dr. Georgann Housekeeper message

## 2023-01-08 ENCOUNTER — Other Ambulatory Visit: Payer: Self-pay | Admitting: Neurology

## 2023-01-09 NOTE — Telephone Encounter (Signed)
Pt last seen on 03/11/22 per note "She will continue donepezil 10mg , memantine XR 21mg  and escitalopram 5mg  daily. "  No 6  follow up scheduled   Donepezil last filled on 09/23/22 #90 tablets (90 day supply) Memantine 21 capsule last filled on 09/23/22 #90 tablets (90 day supply)

## 2023-01-13 ENCOUNTER — Telehealth: Payer: Self-pay

## 2023-01-13 ENCOUNTER — Telehealth: Payer: Self-pay | Admitting: Family Medicine

## 2023-01-13 NOTE — Telephone Encounter (Signed)
Spoke to Patient's daughter Lauren [name] she states she will have to see if her brother or [facility] can bring her Mother in for an appointment and they will call back to schedule.

## 2023-01-13 NOTE — Telephone Encounter (Signed)
Patient's daughter Gloris Manchester called, [phone]. Is requesting a copy of patient's FL2 be faxed to [facility]. At time of call [name] did not have the fax number and will call office back with fax number.

## 2023-01-13 NOTE — Telephone Encounter (Signed)
Lauren [name] doctor of the day ordered UA & Urine culture. Faxed the orders to [facility] at [phone] and received ok ocnfirmation.

## 2023-01-13 NOTE — Telephone Encounter (Signed)
[facility] called and requested that we fax over a urine dipstick order for this Patient. Patient is experiencing frequency . Is it ok for me to fax this over to [facility]?

## 2023-01-14 NOTE — Telephone Encounter (Signed)
Pt daughter French Ana called in regarding previous message. They need the latest copy of FL2 to be fax to Social [facility] @[phone], att: Ms. Eliberto Ivory.

## 2023-01-15 NOTE — Telephone Encounter (Signed)
Printed and faxed FL2 forms to Social Services @ [phone] and Attn: Ms. Eliberto Ivory like the Patient's daughter Gloris Manchester requested and received a okay confirmation.

## 2023-01-17 ENCOUNTER — Encounter: Payer: Self-pay | Admitting: Internal Medicine

## 2023-01-22 ENCOUNTER — Telehealth: Payer: Self-pay | Admitting: Family Medicine

## 2023-01-22 NOTE — Telephone Encounter (Addendum)
Patient's daughter French Ana called. She is requesting a copy of patient's FL2, most recent CPE, patient history, and Demographics. She would like it faxed to [facility], fax # [phone] AND [phone]: Admissions  Please call French Ana at [phone], when faxed.

## 2023-01-22 NOTE — Telephone Encounter (Signed)
Faxed all requested info via efax & notified French Ana

## 2023-01-24 DIAGNOSIS — B351 Tinea unguium: Secondary | ICD-10-CM | POA: Diagnosis not present

## 2023-01-24 DIAGNOSIS — G309 Alzheimer's disease, unspecified: Secondary | ICD-10-CM | POA: Diagnosis not present

## 2023-01-24 DIAGNOSIS — L600 Ingrowing nail: Secondary | ICD-10-CM | POA: Diagnosis not present

## 2023-01-24 DIAGNOSIS — I872 Venous insufficiency (chronic) (peripheral): Secondary | ICD-10-CM | POA: Diagnosis not present

## 2023-01-24 DIAGNOSIS — R238 Other skin changes: Secondary | ICD-10-CM | POA: Diagnosis not present

## 2023-01-24 DIAGNOSIS — M722 Plantar fascial fibromatosis: Secondary | ICD-10-CM | POA: Diagnosis not present

## 2023-01-24 DIAGNOSIS — R262 Difficulty in walking, not elsewhere classified: Secondary | ICD-10-CM | POA: Diagnosis not present

## 2023-01-24 DIAGNOSIS — M6281 Muscle weakness (generalized): Secondary | ICD-10-CM | POA: Diagnosis not present

## 2023-01-28 NOTE — Telephone Encounter (Signed)
Patient's daughter French Ana called. [facility] did not receive the fax from our office. Please resend asap,and call French Ana to let her know papers have been faxed.

## 2023-01-30 NOTE — Telephone Encounter (Addendum)
Faxed all requested to [facility] at [phone] Received okay confirmation and let French Ana the daughter know that it has been faxed

## 2023-01-30 NOTE — Telephone Encounter (Signed)
French Ana called on 6/4, see note below. She really needs this info sent today.

## 2023-02-11 ENCOUNTER — Telehealth: Payer: Self-pay | Admitting: Family Medicine

## 2023-02-11 NOTE — Telephone Encounter (Signed)
Patient dropped off document FL2, to be filled out by provider. Patient requested to send it via Call Patient to pick up within 7-days. Document is located in providers color folder at front office.Please advise at  [facility], [phone].

## 2023-02-11 NOTE — Telephone Encounter (Signed)
Paperwork moved to Dr. De Nurse box in the clinical POD

## 2023-02-14 NOTE — Telephone Encounter (Signed)
Signed. Placed in signed folder.  

## 2023-02-14 NOTE — Telephone Encounter (Signed)
I have signed one form, though it looks like another FL2 came in as well. This needs to be filled out prior to me signing it. There is an FL2 form from 01/10/23 that is in the media tab. Please look at that and transfer all of that information to the current FL2 form and then I will review and sign. Thanks.

## 2023-02-17 ENCOUNTER — Other Ambulatory Visit: Payer: Self-pay

## 2023-02-17 MED ORDER — DONEPEZIL HCL 10 MG PO TABS: ORAL_TABLET | ORAL | 0 refills | Status: AC

## 2023-02-17 NOTE — Telephone Encounter (Signed)
Patient's son, Elim Peale, called to see if the [facility] was ready for pickup.  I read Dr. Bernardo Heater message to [place].  Please call Tawanna Cooler when form is ready for pickup.

## 2023-02-18 NOTE — Telephone Encounter (Signed)
Signed.

## 2023-02-18 NOTE — Telephone Encounter (Signed)
Form put up front for pick up

## 2023-02-18 NOTE — Telephone Encounter (Signed)
Filled out form and placed in Dr. Purvis Sheffield needs to be signed basket

## 2023-02-19 ENCOUNTER — Ambulatory Visit: Payer: Medicare Other | Admitting: Family Medicine

## 2023-02-19 NOTE — Telephone Encounter (Signed)
Noted  

## 2023-02-19 NOTE — Telephone Encounter (Signed)
Aaliyah from [facility] (phone:  [phone]) states she is calling to see if FL2 form has been completed for patient. Achille Rich states patient is planning to move from [facility] to [facility] within the next couple of days.  I let her know the form has been completed and we have it ready for patient's son, Cerena Baine, to pick up.  I left a voicemail for Tawanna Cooler letting him know the form is ready to be picked up.    Achille Rich states she would like for [place] to please ask Tawanna Cooler to call them and let them know when the form has been picked up.  I attache a note to the form with the request from [facility].

## 2023-02-24 ENCOUNTER — Other Ambulatory Visit: Payer: Self-pay | Admitting: Neurology

## 2023-02-24 ENCOUNTER — Other Ambulatory Visit: Payer: Self-pay

## 2023-02-24 MED ORDER — MEMANTINE HCL ER 21 MG PO CP24: ORAL_CAPSULE | ORAL | 0 refills | Status: AC

## 2023-02-24 NOTE — Telephone Encounter (Signed)
Last seen 03/11/22 No follow up scheduled  Last filled on 10/28/22 #90 tablets (90 day supply)

## 2023-03-04 DIAGNOSIS — I10 Essential (primary) hypertension: Secondary | ICD-10-CM | POA: Diagnosis not present

## 2023-03-05 DIAGNOSIS — F32A Depression, unspecified: Secondary | ICD-10-CM | POA: Diagnosis not present

## 2023-03-05 DIAGNOSIS — H409 Unspecified glaucoma: Secondary | ICD-10-CM | POA: Diagnosis not present

## 2023-03-05 DIAGNOSIS — M810 Age-related osteoporosis without current pathological fracture: Secondary | ICD-10-CM | POA: Diagnosis not present

## 2023-03-05 DIAGNOSIS — I10 Essential (primary) hypertension: Secondary | ICD-10-CM | POA: Diagnosis not present

## 2023-03-05 DIAGNOSIS — F0394 Unspecified dementia, unspecified severity, with anxiety: Secondary | ICD-10-CM | POA: Diagnosis not present

## 2023-03-05 DIAGNOSIS — E785 Hyperlipidemia, unspecified: Secondary | ICD-10-CM | POA: Diagnosis not present

## 2023-03-05 DIAGNOSIS — G309 Alzheimer's disease, unspecified: Secondary | ICD-10-CM | POA: Diagnosis not present

## 2023-03-11 DIAGNOSIS — E785 Hyperlipidemia, unspecified: Secondary | ICD-10-CM | POA: Diagnosis not present

## 2023-03-11 DIAGNOSIS — H409 Unspecified glaucoma: Secondary | ICD-10-CM | POA: Diagnosis not present

## 2023-03-11 DIAGNOSIS — I10 Essential (primary) hypertension: Secondary | ICD-10-CM | POA: Diagnosis not present

## 2023-03-11 DIAGNOSIS — M810 Age-related osteoporosis without current pathological fracture: Secondary | ICD-10-CM | POA: Diagnosis not present

## 2023-03-11 DIAGNOSIS — G309 Alzheimer's disease, unspecified: Secondary | ICD-10-CM | POA: Diagnosis not present

## 2023-03-11 DIAGNOSIS — F32A Depression, unspecified: Secondary | ICD-10-CM | POA: Diagnosis not present

## 2023-03-31 ENCOUNTER — Ambulatory Visit: Payer: Self-pay

## 2023-03-31 NOTE — Patient Outreach (Signed)
  Care Coordination   Initial Visit Note   03/31/2023 Name: Lauren [name] MRN: [id number] DOB: 16-Aug-1941  Lauren [name] is a 82 y.o. year old female who sees Lauren [name], Lauren Mao, MD for primary care. I spoke with  Lauren [name]'s dagughter Lauren [name] by phone today.  What matters to the patients health and wellness today?  Patient is currently in long term placement at [name] and declines [name] services.    Goals Addressed   None     SDOH assessments and interventions completed:  No     Care Coordination Interventions:  No, not indicated   Follow up plan: No further intervention required.   Encounter Outcome:  Pt. Refused

## 2023-04-02 DIAGNOSIS — H409 Unspecified glaucoma: Secondary | ICD-10-CM | POA: Diagnosis not present

## 2023-04-02 DIAGNOSIS — G309 Alzheimer's disease, unspecified: Secondary | ICD-10-CM | POA: Diagnosis not present

## 2023-04-02 DIAGNOSIS — H1132 Conjunctival hemorrhage, left eye: Secondary | ICD-10-CM | POA: Diagnosis not present

## 2023-04-02 DIAGNOSIS — I10 Essential (primary) hypertension: Secondary | ICD-10-CM | POA: Diagnosis not present

## 2023-04-10 DIAGNOSIS — I10 Essential (primary) hypertension: Secondary | ICD-10-CM | POA: Diagnosis not present

## 2023-04-10 DIAGNOSIS — H409 Unspecified glaucoma: Secondary | ICD-10-CM | POA: Diagnosis not present

## 2023-04-10 DIAGNOSIS — J309 Allergic rhinitis, unspecified: Secondary | ICD-10-CM | POA: Diagnosis not present

## 2023-04-10 DIAGNOSIS — G309 Alzheimer's disease, unspecified: Secondary | ICD-10-CM | POA: Diagnosis not present

## 2023-04-15 DIAGNOSIS — Z23 Encounter for immunization: Secondary | ICD-10-CM | POA: Diagnosis not present

## 2023-05-06 DIAGNOSIS — I10 Essential (primary) hypertension: Secondary | ICD-10-CM | POA: Diagnosis not present

## 2023-05-06 DIAGNOSIS — U071 COVID-19: Secondary | ICD-10-CM | POA: Diagnosis not present

## 2023-05-06 DIAGNOSIS — H401130 Primary open-angle glaucoma, bilateral, stage unspecified: Secondary | ICD-10-CM | POA: Diagnosis not present

## 2023-05-08 DIAGNOSIS — I10 Essential (primary) hypertension: Secondary | ICD-10-CM | POA: Diagnosis not present

## 2023-05-08 DIAGNOSIS — H409 Unspecified glaucoma: Secondary | ICD-10-CM | POA: Diagnosis not present

## 2023-05-08 DIAGNOSIS — G309 Alzheimer's disease, unspecified: Secondary | ICD-10-CM | POA: Diagnosis not present

## 2023-05-08 DIAGNOSIS — H401130 Primary open-angle glaucoma, bilateral, stage unspecified: Secondary | ICD-10-CM | POA: Diagnosis not present

## 2023-05-08 DIAGNOSIS — J309 Allergic rhinitis, unspecified: Secondary | ICD-10-CM | POA: Diagnosis not present

## 2023-05-08 DIAGNOSIS — U071 COVID-19: Secondary | ICD-10-CM | POA: Diagnosis not present

## 2023-06-02 DIAGNOSIS — E785 Hyperlipidemia, unspecified: Secondary | ICD-10-CM | POA: Diagnosis not present

## 2023-06-02 DIAGNOSIS — G309 Alzheimer's disease, unspecified: Secondary | ICD-10-CM | POA: Diagnosis not present

## 2023-06-02 DIAGNOSIS — I10 Essential (primary) hypertension: Secondary | ICD-10-CM | POA: Diagnosis not present

## 2023-06-04 DIAGNOSIS — R54 Age-related physical debility: Secondary | ICD-10-CM | POA: Diagnosis not present

## 2023-06-04 DIAGNOSIS — I1310 Hypertensive heart and chronic kidney disease without heart failure, with stage 1 through stage 4 chronic kidney disease, or unspecified chronic kidney disease: Secondary | ICD-10-CM | POA: Diagnosis not present

## 2023-06-10 DIAGNOSIS — H409 Unspecified glaucoma: Secondary | ICD-10-CM | POA: Diagnosis not present

## 2023-06-10 DIAGNOSIS — G309 Alzheimer's disease, unspecified: Secondary | ICD-10-CM | POA: Diagnosis not present

## 2023-06-10 DIAGNOSIS — Z23 Encounter for immunization: Secondary | ICD-10-CM | POA: Diagnosis not present

## 2023-06-10 DIAGNOSIS — J309 Allergic rhinitis, unspecified: Secondary | ICD-10-CM | POA: Diagnosis not present

## 2023-06-10 DIAGNOSIS — I10 Essential (primary) hypertension: Secondary | ICD-10-CM | POA: Diagnosis not present

## 2023-06-12 ENCOUNTER — Telehealth: Payer: Self-pay | Admitting: Family Medicine

## 2023-06-12 NOTE — Telephone Encounter (Signed)
Patient dropped off document Handicap Placard, to be filled out by provider. Patient requested to send it via Call Patient to pick up within 5-days. Document is located in providers folder at front office.Please advise at Mobile [phone] (mobile)

## 2023-06-13 NOTE — Telephone Encounter (Signed)
Patient's daughter French Ana states her mother has alzheimers now and she takes baby steps and gets out of breath when walking. Patient does not use a cane or walker at this time. French Ana also states they have to stop every few steps and take a break.

## 2023-06-13 NOTE — Telephone Encounter (Signed)
Is she able to walk more than 200 feet without stopping?  Does she use a cane or walker to help walk?  I need to know a specific reason for her needing this.  Thanks.

## 2023-06-13 NOTE — Telephone Encounter (Signed)
Noted.  Form signed.  Please fill in the rest of the information and provide it for pickup.

## 2023-06-26 DIAGNOSIS — Z23 Encounter for immunization: Secondary | ICD-10-CM | POA: Diagnosis not present

## 2023-06-26 NOTE — Telephone Encounter (Signed)
Error

## 2023-06-26 NOTE — Telephone Encounter (Signed)
Phone call to Gloris Manchester, daughter to notify her that form was ready for pickup.  Placed in front office for pick up.

## 2023-06-30 DIAGNOSIS — I1310 Hypertensive heart and chronic kidney disease without heart failure, with stage 1 through stage 4 chronic kidney disease, or unspecified chronic kidney disease: Secondary | ICD-10-CM | POA: Diagnosis not present

## 2023-06-30 DIAGNOSIS — R54 Age-related physical debility: Secondary | ICD-10-CM | POA: Diagnosis not present

## 2023-07-29 DIAGNOSIS — I10 Essential (primary) hypertension: Secondary | ICD-10-CM | POA: Diagnosis not present

## 2023-07-29 DIAGNOSIS — G309 Alzheimer's disease, unspecified: Secondary | ICD-10-CM | POA: Diagnosis not present

## 2023-08-05 DIAGNOSIS — E785 Hyperlipidemia, unspecified: Secondary | ICD-10-CM | POA: Diagnosis not present

## 2023-08-05 DIAGNOSIS — I10 Essential (primary) hypertension: Secondary | ICD-10-CM | POA: Diagnosis not present

## 2023-08-05 DIAGNOSIS — G309 Alzheimer's disease, unspecified: Secondary | ICD-10-CM | POA: Diagnosis not present

## 2023-08-05 DIAGNOSIS — H409 Unspecified glaucoma: Secondary | ICD-10-CM | POA: Diagnosis not present

## 2023-08-26 DIAGNOSIS — I1310 Hypertensive heart and chronic kidney disease without heart failure, with stage 1 through stage 4 chronic kidney disease, or unspecified chronic kidney disease: Secondary | ICD-10-CM | POA: Diagnosis not present

## 2023-08-26 DIAGNOSIS — R54 Age-related physical debility: Secondary | ICD-10-CM | POA: Diagnosis not present

## 2023-08-28 DIAGNOSIS — I10 Essential (primary) hypertension: Secondary | ICD-10-CM | POA: Diagnosis not present

## 2023-08-28 DIAGNOSIS — E785 Hyperlipidemia, unspecified: Secondary | ICD-10-CM | POA: Diagnosis not present

## 2023-08-28 DIAGNOSIS — G309 Alzheimer's disease, unspecified: Secondary | ICD-10-CM | POA: Diagnosis not present

## 2023-08-28 DIAGNOSIS — M810 Age-related osteoporosis without current pathological fracture: Secondary | ICD-10-CM | POA: Diagnosis not present

## 2023-09-02 DIAGNOSIS — M6281 Muscle weakness (generalized): Secondary | ICD-10-CM | POA: Diagnosis not present

## 2023-09-02 DIAGNOSIS — R2689 Other abnormalities of gait and mobility: Secondary | ICD-10-CM | POA: Diagnosis not present

## 2023-09-03 DIAGNOSIS — M6281 Muscle weakness (generalized): Secondary | ICD-10-CM | POA: Diagnosis not present

## 2023-09-03 DIAGNOSIS — R2689 Other abnormalities of gait and mobility: Secondary | ICD-10-CM | POA: Diagnosis not present

## 2023-09-04 DIAGNOSIS — M6281 Muscle weakness (generalized): Secondary | ICD-10-CM | POA: Diagnosis not present

## 2023-09-04 DIAGNOSIS — R2689 Other abnormalities of gait and mobility: Secondary | ICD-10-CM | POA: Diagnosis not present

## 2023-09-05 DIAGNOSIS — M6281 Muscle weakness (generalized): Secondary | ICD-10-CM | POA: Diagnosis not present

## 2023-09-05 DIAGNOSIS — R2689 Other abnormalities of gait and mobility: Secondary | ICD-10-CM | POA: Diagnosis not present

## 2023-09-08 DIAGNOSIS — M6281 Muscle weakness (generalized): Secondary | ICD-10-CM | POA: Diagnosis not present

## 2023-09-08 DIAGNOSIS — R2689 Other abnormalities of gait and mobility: Secondary | ICD-10-CM | POA: Diagnosis not present

## 2023-09-09 DIAGNOSIS — R2689 Other abnormalities of gait and mobility: Secondary | ICD-10-CM | POA: Diagnosis not present

## 2023-09-09 DIAGNOSIS — M6281 Muscle weakness (generalized): Secondary | ICD-10-CM | POA: Diagnosis not present

## 2023-09-11 DIAGNOSIS — R2689 Other abnormalities of gait and mobility: Secondary | ICD-10-CM | POA: Diagnosis not present

## 2023-09-11 DIAGNOSIS — M6281 Muscle weakness (generalized): Secondary | ICD-10-CM | POA: Diagnosis not present

## 2023-09-15 DIAGNOSIS — R2689 Other abnormalities of gait and mobility: Secondary | ICD-10-CM | POA: Diagnosis not present

## 2023-09-15 DIAGNOSIS — M6281 Muscle weakness (generalized): Secondary | ICD-10-CM | POA: Diagnosis not present

## 2023-09-30 DIAGNOSIS — E559 Vitamin D deficiency, unspecified: Secondary | ICD-10-CM | POA: Diagnosis not present

## 2023-09-30 DIAGNOSIS — E7800 Pure hypercholesterolemia, unspecified: Secondary | ICD-10-CM | POA: Diagnosis not present

## 2023-09-30 DIAGNOSIS — I10 Essential (primary) hypertension: Secondary | ICD-10-CM | POA: Diagnosis not present

## 2023-10-09 DIAGNOSIS — G309 Alzheimer's disease, unspecified: Secondary | ICD-10-CM | POA: Diagnosis not present

## 2023-10-09 DIAGNOSIS — E7800 Pure hypercholesterolemia, unspecified: Secondary | ICD-10-CM | POA: Diagnosis not present

## 2023-10-09 DIAGNOSIS — H409 Unspecified glaucoma: Secondary | ICD-10-CM | POA: Diagnosis not present

## 2023-10-09 DIAGNOSIS — M810 Age-related osteoporosis without current pathological fracture: Secondary | ICD-10-CM | POA: Diagnosis not present

## 2023-10-09 DIAGNOSIS — I10 Essential (primary) hypertension: Secondary | ICD-10-CM | POA: Diagnosis not present

## 2023-10-29 DIAGNOSIS — R600 Localized edema: Secondary | ICD-10-CM | POA: Diagnosis not present

## 2023-10-29 DIAGNOSIS — G309 Alzheimer's disease, unspecified: Secondary | ICD-10-CM | POA: Diagnosis not present

## 2023-10-29 DIAGNOSIS — I10 Essential (primary) hypertension: Secondary | ICD-10-CM | POA: Diagnosis not present

## 2023-10-29 DIAGNOSIS — L03116 Cellulitis of left lower limb: Secondary | ICD-10-CM | POA: Diagnosis not present

## 2023-11-03 DIAGNOSIS — R601 Generalized edema: Secondary | ICD-10-CM | POA: Diagnosis not present

## 2023-11-14 DIAGNOSIS — R600 Localized edema: Secondary | ICD-10-CM | POA: Diagnosis not present

## 2023-11-14 DIAGNOSIS — I10 Essential (primary) hypertension: Secondary | ICD-10-CM | POA: Diagnosis not present

## 2023-11-14 DIAGNOSIS — L03116 Cellulitis of left lower limb: Secondary | ICD-10-CM | POA: Diagnosis not present

## 2023-11-14 DIAGNOSIS — H409 Unspecified glaucoma: Secondary | ICD-10-CM | POA: Diagnosis not present

## 2023-11-15 DIAGNOSIS — M25572 Pain in left ankle and joints of left foot: Secondary | ICD-10-CM | POA: Diagnosis not present

## 2023-11-15 DIAGNOSIS — I10 Essential (primary) hypertension: Secondary | ICD-10-CM | POA: Diagnosis not present

## 2023-11-27 DIAGNOSIS — J309 Allergic rhinitis, unspecified: Secondary | ICD-10-CM | POA: Diagnosis not present

## 2023-11-27 DIAGNOSIS — H409 Unspecified glaucoma: Secondary | ICD-10-CM | POA: Diagnosis not present

## 2023-11-27 DIAGNOSIS — R600 Localized edema: Secondary | ICD-10-CM | POA: Diagnosis not present

## 2023-11-27 DIAGNOSIS — L03116 Cellulitis of left lower limb: Secondary | ICD-10-CM | POA: Diagnosis not present

## 2023-12-09 DIAGNOSIS — J309 Allergic rhinitis, unspecified: Secondary | ICD-10-CM | POA: Diagnosis not present

## 2023-12-09 DIAGNOSIS — I10 Essential (primary) hypertension: Secondary | ICD-10-CM | POA: Diagnosis not present

## 2023-12-09 DIAGNOSIS — R600 Localized edema: Secondary | ICD-10-CM | POA: Diagnosis not present

## 2023-12-09 DIAGNOSIS — H409 Unspecified glaucoma: Secondary | ICD-10-CM | POA: Diagnosis not present

## 2023-12-09 DIAGNOSIS — M810 Age-related osteoporosis without current pathological fracture: Secondary | ICD-10-CM | POA: Diagnosis not present

## 2023-12-24 DIAGNOSIS — I129 Hypertensive chronic kidney disease with stage 1 through stage 4 chronic kidney disease, or unspecified chronic kidney disease: Secondary | ICD-10-CM | POA: Diagnosis not present

## 2023-12-24 DIAGNOSIS — N183 Chronic kidney disease, stage 3 unspecified: Secondary | ICD-10-CM | POA: Diagnosis not present

## 2023-12-25 DIAGNOSIS — N183 Chronic kidney disease, stage 3 unspecified: Secondary | ICD-10-CM | POA: Diagnosis not present

## 2023-12-25 DIAGNOSIS — G309 Alzheimer's disease, unspecified: Secondary | ICD-10-CM | POA: Diagnosis not present

## 2023-12-25 DIAGNOSIS — R600 Localized edema: Secondary | ICD-10-CM | POA: Diagnosis not present

## 2023-12-25 DIAGNOSIS — I872 Venous insufficiency (chronic) (peripheral): Secondary | ICD-10-CM | POA: Diagnosis not present

## 2023-12-25 DIAGNOSIS — I129 Hypertensive chronic kidney disease with stage 1 through stage 4 chronic kidney disease, or unspecified chronic kidney disease: Secondary | ICD-10-CM | POA: Diagnosis not present

## 2024-01-07 DIAGNOSIS — R059 Cough, unspecified: Secondary | ICD-10-CM | POA: Diagnosis not present

## 2024-01-08 DIAGNOSIS — R059 Cough, unspecified: Secondary | ICD-10-CM | POA: Diagnosis not present

## 2024-01-12 DIAGNOSIS — R059 Cough, unspecified: Secondary | ICD-10-CM | POA: Diagnosis not present

## 2024-01-21 DIAGNOSIS — H401190 Primary open-angle glaucoma, unspecified eye, stage unspecified: Secondary | ICD-10-CM | POA: Diagnosis not present

## 2024-01-21 DIAGNOSIS — I129 Hypertensive chronic kidney disease with stage 1 through stage 4 chronic kidney disease, or unspecified chronic kidney disease: Secondary | ICD-10-CM | POA: Diagnosis not present

## 2024-01-31 DIAGNOSIS — R309 Painful micturition, unspecified: Secondary | ICD-10-CM | POA: Diagnosis not present

## 2024-02-03 DIAGNOSIS — I10 Essential (primary) hypertension: Secondary | ICD-10-CM | POA: Diagnosis not present

## 2024-02-03 DIAGNOSIS — H401190 Primary open-angle glaucoma, unspecified eye, stage unspecified: Secondary | ICD-10-CM | POA: Diagnosis not present

## 2024-02-03 DIAGNOSIS — R600 Localized edema: Secondary | ICD-10-CM | POA: Diagnosis not present

## 2024-02-05 DIAGNOSIS — H401190 Primary open-angle glaucoma, unspecified eye, stage unspecified: Secondary | ICD-10-CM | POA: Diagnosis not present

## 2024-02-05 DIAGNOSIS — I10 Essential (primary) hypertension: Secondary | ICD-10-CM | POA: Diagnosis not present

## 2024-02-05 DIAGNOSIS — N183 Chronic kidney disease, stage 3 unspecified: Secondary | ICD-10-CM | POA: Diagnosis not present

## 2024-02-05 DIAGNOSIS — N390 Urinary tract infection, site not specified: Secondary | ICD-10-CM | POA: Diagnosis not present

## 2024-02-05 DIAGNOSIS — R600 Localized edema: Secondary | ICD-10-CM | POA: Diagnosis not present

## 2024-03-02 DIAGNOSIS — N390 Urinary tract infection, site not specified: Secondary | ICD-10-CM | POA: Diagnosis not present

## 2024-03-04 DIAGNOSIS — E559 Vitamin D deficiency, unspecified: Secondary | ICD-10-CM | POA: Diagnosis not present

## 2024-03-04 DIAGNOSIS — N183 Chronic kidney disease, stage 3 unspecified: Secondary | ICD-10-CM | POA: Diagnosis not present

## 2024-03-04 DIAGNOSIS — R262 Difficulty in walking, not elsewhere classified: Secondary | ICD-10-CM | POA: Diagnosis not present

## 2024-03-04 DIAGNOSIS — G309 Alzheimer's disease, unspecified: Secondary | ICD-10-CM | POA: Diagnosis not present

## 2024-03-04 DIAGNOSIS — M6281 Muscle weakness (generalized): Secondary | ICD-10-CM | POA: Diagnosis not present

## 2024-03-04 DIAGNOSIS — I129 Hypertensive chronic kidney disease with stage 1 through stage 4 chronic kidney disease, or unspecified chronic kidney disease: Secondary | ICD-10-CM | POA: Diagnosis not present

## 2024-03-05 DIAGNOSIS — N390 Urinary tract infection, site not specified: Secondary | ICD-10-CM | POA: Diagnosis not present

## 2024-03-05 DIAGNOSIS — R262 Difficulty in walking, not elsewhere classified: Secondary | ICD-10-CM | POA: Diagnosis not present

## 2024-03-05 DIAGNOSIS — M6281 Muscle weakness (generalized): Secondary | ICD-10-CM | POA: Diagnosis not present

## 2024-03-08 DIAGNOSIS — M6281 Muscle weakness (generalized): Secondary | ICD-10-CM | POA: Diagnosis not present

## 2024-03-08 DIAGNOSIS — R262 Difficulty in walking, not elsewhere classified: Secondary | ICD-10-CM | POA: Diagnosis not present

## 2024-03-09 DIAGNOSIS — R262 Difficulty in walking, not elsewhere classified: Secondary | ICD-10-CM | POA: Diagnosis not present

## 2024-03-09 DIAGNOSIS — M6281 Muscle weakness (generalized): Secondary | ICD-10-CM | POA: Diagnosis not present

## 2024-03-10 DIAGNOSIS — M6281 Muscle weakness (generalized): Secondary | ICD-10-CM | POA: Diagnosis not present

## 2024-03-10 DIAGNOSIS — R262 Difficulty in walking, not elsewhere classified: Secondary | ICD-10-CM | POA: Diagnosis not present

## 2024-03-12 ENCOUNTER — Emergency Department
Admission: EM | Admit: 2024-03-12 | Discharge: 2024-03-12 | Disposition: A | Discharge status: Skilled Nursing Facility | Attending: Emergency Medicine | Admitting: Emergency Medicine

## 2024-03-12 ENCOUNTER — Emergency Department

## 2024-03-12 ENCOUNTER — Other Ambulatory Visit: Payer: Self-pay

## 2024-03-12 ENCOUNTER — Encounter: Payer: Self-pay | Admitting: Emergency Medicine

## 2024-03-12 DIAGNOSIS — S61512A Laceration without foreign body of left wrist, initial encounter: Secondary | ICD-10-CM | POA: Diagnosis not present

## 2024-03-12 DIAGNOSIS — M7989 Other specified soft tissue disorders: Secondary | ICD-10-CM

## 2024-03-12 DIAGNOSIS — W458XXA Other foreign body or object entering through skin, initial encounter: Secondary | ICD-10-CM | POA: Diagnosis not present

## 2024-03-12 DIAGNOSIS — F0390 Unspecified dementia without behavioral disturbance: Secondary | ICD-10-CM | POA: Insufficient documentation

## 2024-03-12 DIAGNOSIS — I10 Essential (primary) hypertension: Secondary | ICD-10-CM | POA: Diagnosis not present

## 2024-03-12 DIAGNOSIS — Z23 Encounter for immunization: Secondary | ICD-10-CM | POA: Insufficient documentation

## 2024-03-12 DIAGNOSIS — I82811 Embolism and thrombosis of superficial veins of right lower extremities: Secondary | ICD-10-CM

## 2024-03-12 DIAGNOSIS — R404 Transient alteration of awareness: Secondary | ICD-10-CM | POA: Diagnosis not present

## 2024-03-12 DIAGNOSIS — S6990XA Unspecified injury of unspecified wrist, hand and finger(s), initial encounter: Secondary | ICD-10-CM | POA: Diagnosis not present

## 2024-03-12 DIAGNOSIS — S6992XA Unspecified injury of left wrist, hand and finger(s), initial encounter: Secondary | ICD-10-CM | POA: Diagnosis present

## 2024-03-12 MED ORDER — TETANUS-DIPHTH-ACELL PERTUSSIS 5-2.5-18.5 LF-MCG/0.5 IM SUSY
0.5000 mL | PREFILLED_SYRINGE | Freq: Once | INTRAMUSCULAR | Status: AC
  Administered 2024-03-12: 0.5 mL via INTRAMUSCULAR
  Filled 2024-03-12: qty 0.5

## 2024-03-12 MED ORDER — LIDOCAINE-EPINEPHRINE 2 %-1:100000 IJ SOLN
20.0000 mL | Freq: Once | INTRAMUSCULAR | Status: AC
  Administered 2024-03-12: 20 mL
  Filled 2024-03-12: qty 1

## 2024-03-12 MED ORDER — ASPIRIN 81 MG PO TBEC: 81.0000 mg | DELAYED_RELEASE_TABLET | Freq: Every day | ORAL | 2 refills | Status: AC

## 2024-03-12 MED ORDER — CEPHALEXIN 500 MG PO CAPS: 500.0000 mg | ORAL_CAPSULE | Freq: Four times a day (QID) | ORAL | 0 refills | Status: DC

## 2024-03-12 MED ORDER — CEPHALEXIN 500 MG PO CAPS: 500.0000 mg | ORAL_CAPSULE | Freq: Four times a day (QID) | ORAL | 0 refills | Status: AC

## 2024-03-12 NOTE — ED Notes (Signed)
 Fall precaution interventions in place.

## 2024-03-12 NOTE — ED Triage Notes (Addendum)
 BIB ems from [facility] per ems staff at facility said she didn't want to get up this morning when they went in to get her up, she then became combative and was cut on left forearm by a bracelet. EMS unsure if bracelet was on the pt or staff member.  Per ems pt has been calm and cooperative with them, pt just making a noise in back of throat that sounds like a frog. Staff did not say if pt is at her baseline or not.  Pt has lac to left forearm, bleeding controlled with bandage  Pt a&ox2

## 2024-03-12 NOTE — ED Provider Notes (Addendum)
 [facility] Provider Note    Event Date/Time   First MD Initiated Contact with Patient 03/12/24 0703     (approximate)   History   Chief Complaint Laceration   HPI  Lauren [name] is a 83 y.o. female with past medical history of hypertension, migraines, denies all manage dementia who presents to the ED complaining of laceration.  Daughter at bedside reports that patient was agitated with staff at her nursing facility this morning, subsequently sustained a laceration to her left wrist due to a brace that she was wearing.  Daughter is unsure of patient's last tetanus shot.     Physical Exam   Triage Vital Signs: ED Triage Vitals [03/12/24 0641]  Encounter Vitals Group     BP (!) 144/66     Girls Systolic BP Percentile      Girls Diastolic BP Percentile      Boys Systolic BP Percentile      Boys Diastolic BP Percentile      Pulse Rate 72     Resp 17     Temp 97.9 F (36.6 C)     Temp Source Oral     SpO2 98 %     Weight 138 lb 14.2 oz (63 kg)     Height 5' 4 (1.626 m)     Head Circumference      Peak Flow      Pain Score      Pain Loc      Pain Education      Exclude from Growth Chart     Most recent vital signs: Vitals:   03/12/24 0641 03/12/24 0700  BP: (!) 144/66 132/86  Pulse: 72 67  Resp: 17 16  Temp: 97.9 F (36.6 C)   SpO2: 98% 99%    Constitutional: Awake and alert. Eyes: Conjunctivae are normal. Head: Atraumatic. Nose: No congestion/rhinnorhea. Mouth/Throat: Mucous membranes are moist.  Cardiovascular: Normal rate, regular rhythm. Grossly normal heart sounds.  2+ radial pulses bilaterally. Respiratory: Normal respiratory effort.  No retractions. Lungs CTAB. Gastrointestinal: Soft and nontender. No distention. Musculoskeletal: No lower extremity tenderness nor edema.  Stellate laceration to left dorsal wrist with skin tear at distal portions.  Range of motion intact throughout left hand and wrist. Neurologic:  Normal  speech and language. No gross focal neurologic deficits are appreciated.    ED Results / Procedures / Treatments   Labs (all labs ordered are listed, but only abnormal results are displayed) Labs Reviewed - No data to display   PROCEDURES:  Critical Care performed: No  .Laceration Repair  Date/Time: 03/12/2024 8:38 AM  Performed by: Willo Dunnings, MD Authorized by: Willo Dunnings, MD   Consent:    Consent given by:  Guardian   Risks, benefits, and alternatives were discussed: yes   Universal protocol:    Patient identity confirmed:  Verbally with patient and arm band Anesthesia:    Anesthesia method:  Local infiltration   Local anesthetic:  Lidocaine 2% WITH epi Laceration details:    Location:  Shoulder/arm   Shoulder/arm location:  L lower arm   Length (cm):  9 Pre-procedure details:    Preparation:  Patient was prepped and draped in usual sterile fashion Exploration:    Limited defect created (wound extended): no     Hemostasis achieved with:  Epinephrine and direct pressure   Wound exploration: wound explored through full range of motion and entire depth of wound visualized     Wound extent: areolar  tissue not violated, fascia not violated, no foreign body, no signs of injury, no nerve damage, no tendon damage, no underlying fracture and no vascular damage     Contaminated: no   Treatment:    Amount of cleaning:  Standard   Irrigation solution:  Sterile water   Irrigation method:  Pressure wash   Debridement:  None   Undermining:  None   Scar revision: no   Skin repair:    Repair method:  Sutures   Suture size:  4-0   Suture material:  Nylon   Suture technique:  Simple interrupted   Number of sutures:  9 Approximation:    Approximation:  Loose Repair type:    Repair type:  Simple Post-procedure details:    Dressing:  Non-adherent dressing   Procedure completion:  Tolerated well, no immediate complications    MEDICATIONS ORDERED IN ED: Medications   Tdap (BOOSTRIX) injection 0.5 mL (has no administration in time range)  lidocaine-EPINEPHrine (XYLOCAINE W/EPI) 2 %-1:100000 (with pres) injection 20 mL (20 mLs Infiltration Given by Other 03/12/24 0829)     IMPRESSION / MDM / ASSESSMENT AND PLAN / ED COURSE  I reviewed the triage vital signs and the nursing notes.                              83 y.o. female with past medical history of hypertension, migraines, and Alzheimer's dementia who presents to the ED complaining of laceration to her left wrist after she became agitated with staff at her nursing facility.  Patient's presentation is most consistent with acute, uncomplicated illness.  Differential diagnosis includes, but is not limited to, laceration, tendon injury, vascular injury.  Patient nontoxic-appearing and in no acute distress, vital signs are unremarkable.  She has a large stellate laceration to the dorsal portion of her distal forearm and wrist.  No evidence of foreign body, tendon or vascular injury on examination.  Wound was copiously irrigated with sterile water and repaired with suture.  Patient appropriate for discharge home with outpatient follow-up for suture removal in 1 week, we will start her on prophylactic antibiotics.  Tetanus was updated and daughter counseled to have her return to the ED for new or worsening symptoms.  Daughter agrees with plan.  ----------------------------------------- 9:24 AM on 03/12/2024 ----------------------------------------- At the time of discharge, patient noted to have some redness and mild swelling to her left lower extremity with strong DP pulse.  Does not appear consistent with a cellulitis, but will further assess with ultrasound to rule out DVT.  If this is unremarkable then I suspect venous insufficiency.  Left lower extremity ultrasound is negative for DVT, does incidentally note thrombosis of the saphenous vein on the right.  Right lower extremity was further assessed with  ultrasound with no evidence of DVT, only superficial venous thrombosis.  Do not feel anticoagulation indicated at this time as patient has no symptoms associated with this, but will start her on a daily baby aspirin.  Referral provided to vascular surgery and daughter counseled to have her return to the ED for new or worsening symptoms.  Daughter agrees with plan.      FINAL CLINICAL IMPRESSION(S) / ED DIAGNOSES   Final diagnoses:  Laceration of left wrist, initial encounter     Rx / DC Orders   ED Discharge Orders          Ordered    cephALEXin  (KEFLEX ) 500 MG capsule  4  times daily        03/12/24 9161             Note:  This document was prepared using Dragon voice recognition software and may include unintentional dictation errors.   Willo Dunnings, MD 03/12/24 9155    Willo Dunnings, MD 03/12/24 [id number]

## 2024-03-15 DIAGNOSIS — I8001 Phlebitis and thrombophlebitis of superficial vessels of right lower extremity: Secondary | ICD-10-CM | POA: Diagnosis not present

## 2024-03-15 DIAGNOSIS — S61512D Laceration without foreign body of left wrist, subsequent encounter: Secondary | ICD-10-CM | POA: Diagnosis not present

## 2024-03-15 DIAGNOSIS — G309 Alzheimer's disease, unspecified: Secondary | ICD-10-CM | POA: Diagnosis not present

## 2024-03-16 DIAGNOSIS — G309 Alzheimer's disease, unspecified: Secondary | ICD-10-CM | POA: Diagnosis not present

## 2024-03-16 DIAGNOSIS — H409 Unspecified glaucoma: Secondary | ICD-10-CM | POA: Diagnosis not present

## 2024-03-26 DIAGNOSIS — S61512D Laceration without foreign body of left wrist, subsequent encounter: Secondary | ICD-10-CM | POA: Diagnosis not present

## 2024-03-26 DIAGNOSIS — S41102A Unspecified open wound of left upper arm, initial encounter: Secondary | ICD-10-CM | POA: Diagnosis not present

## 2024-04-01 DIAGNOSIS — N183 Chronic kidney disease, stage 3 unspecified: Secondary | ICD-10-CM | POA: Diagnosis not present

## 2024-04-01 DIAGNOSIS — M810 Age-related osteoporosis without current pathological fracture: Secondary | ICD-10-CM | POA: Diagnosis not present

## 2024-04-01 DIAGNOSIS — I1310 Hypertensive heart and chronic kidney disease without heart failure, with stage 1 through stage 4 chronic kidney disease, or unspecified chronic kidney disease: Secondary | ICD-10-CM | POA: Diagnosis not present

## 2024-04-01 DIAGNOSIS — H401130 Primary open-angle glaucoma, bilateral, stage unspecified: Secondary | ICD-10-CM | POA: Diagnosis not present

## 2024-04-01 DIAGNOSIS — S41102D Unspecified open wound of left upper arm, subsequent encounter: Secondary | ICD-10-CM | POA: Diagnosis not present

## 2024-04-01 DIAGNOSIS — S61512D Laceration without foreign body of left wrist, subsequent encounter: Secondary | ICD-10-CM | POA: Diagnosis not present

## 2024-04-01 DIAGNOSIS — R600 Localized edema: Secondary | ICD-10-CM | POA: Diagnosis not present

## 2024-04-02 DIAGNOSIS — H409 Unspecified glaucoma: Secondary | ICD-10-CM | POA: Diagnosis not present

## 2024-04-02 DIAGNOSIS — G309 Alzheimer's disease, unspecified: Secondary | ICD-10-CM | POA: Diagnosis not present

## 2024-04-03 DIAGNOSIS — Z79899 Other long term (current) drug therapy: Secondary | ICD-10-CM | POA: Diagnosis not present

## 2024-04-30 DIAGNOSIS — I1310 Hypertensive heart and chronic kidney disease without heart failure, with stage 1 through stage 4 chronic kidney disease, or unspecified chronic kidney disease: Secondary | ICD-10-CM | POA: Diagnosis not present

## 2024-04-30 DIAGNOSIS — N183 Chronic kidney disease, stage 3 unspecified: Secondary | ICD-10-CM | POA: Diagnosis not present

## 2024-04-30 DIAGNOSIS — G309 Alzheimer's disease, unspecified: Secondary | ICD-10-CM | POA: Diagnosis not present

## 2024-05-14 DIAGNOSIS — G309 Alzheimer's disease, unspecified: Secondary | ICD-10-CM | POA: Diagnosis not present

## 2024-05-14 DIAGNOSIS — N183 Chronic kidney disease, stage 3 unspecified: Secondary | ICD-10-CM | POA: Diagnosis not present

## 2024-05-17 DIAGNOSIS — I10 Essential (primary) hypertension: Secondary | ICD-10-CM | POA: Diagnosis not present

## 2024-05-17 DIAGNOSIS — G301 Alzheimer's disease with late onset: Secondary | ICD-10-CM | POA: Diagnosis not present

## 2024-05-17 DIAGNOSIS — N1831 Chronic kidney disease, stage 3a: Secondary | ICD-10-CM | POA: Diagnosis not present

## 2024-05-19 DIAGNOSIS — N390 Urinary tract infection, site not specified: Secondary | ICD-10-CM | POA: Diagnosis not present

## 2024-05-27 DIAGNOSIS — D649 Anemia, unspecified: Secondary | ICD-10-CM | POA: Diagnosis not present

## 2024-05-27 DIAGNOSIS — Z79899 Other long term (current) drug therapy: Secondary | ICD-10-CM | POA: Diagnosis not present

## 2024-05-27 DIAGNOSIS — E559 Vitamin D deficiency, unspecified: Secondary | ICD-10-CM | POA: Diagnosis not present

## 2024-05-31 DIAGNOSIS — I1310 Hypertensive heart and chronic kidney disease without heart failure, with stage 1 through stage 4 chronic kidney disease, or unspecified chronic kidney disease: Secondary | ICD-10-CM | POA: Diagnosis not present

## 2024-05-31 DIAGNOSIS — S61412A Laceration without foreign body of left hand, initial encounter: Secondary | ICD-10-CM | POA: Diagnosis not present

## 2024-05-31 DIAGNOSIS — N183 Chronic kidney disease, stage 3 unspecified: Secondary | ICD-10-CM | POA: Diagnosis not present

## 2024-05-31 DIAGNOSIS — H401190 Primary open-angle glaucoma, unspecified eye, stage unspecified: Secondary | ICD-10-CM | POA: Diagnosis not present

## 2024-06-09 DIAGNOSIS — Z961 Presence of intraocular lens: Secondary | ICD-10-CM | POA: Diagnosis not present

## 2024-06-09 DIAGNOSIS — H524 Presbyopia: Secondary | ICD-10-CM | POA: Diagnosis not present

## 2024-06-09 DIAGNOSIS — D3131 Benign neoplasm of right choroid: Secondary | ICD-10-CM | POA: Diagnosis not present

## 2024-06-21 DIAGNOSIS — M21612 Bunion of left foot: Secondary | ICD-10-CM | POA: Diagnosis not present

## 2024-06-21 DIAGNOSIS — M21611 Bunion of right foot: Secondary | ICD-10-CM | POA: Diagnosis not present

## 2024-06-21 DIAGNOSIS — L602 Onychogryphosis: Secondary | ICD-10-CM | POA: Diagnosis not present

## 2024-06-21 DIAGNOSIS — L84 Corns and callosities: Secondary | ICD-10-CM | POA: Diagnosis not present

## 2024-06-21 DIAGNOSIS — I739 Peripheral vascular disease, unspecified: Secondary | ICD-10-CM | POA: Diagnosis not present

## 2024-06-21 DIAGNOSIS — L603 Nail dystrophy: Secondary | ICD-10-CM | POA: Diagnosis not present
